# Patient Record
Sex: Male | Born: 1940
Health system: Southern US, Community
[De-identification: ages and names within clinical notes are randomized; demographics above are authoritative.]

## PROBLEM LIST (undated history)

## (undated) DIAGNOSIS — I10 Essential (primary) hypertension: Secondary | ICD-10-CM

## (undated) DIAGNOSIS — E669 Obesity, unspecified: Secondary | ICD-10-CM

## (undated) DIAGNOSIS — B009 Herpesviral infection, unspecified: Secondary | ICD-10-CM

## (undated) DIAGNOSIS — F17200 Nicotine dependence, unspecified, uncomplicated: Secondary | ICD-10-CM

## (undated) DIAGNOSIS — J45909 Unspecified asthma, uncomplicated: Secondary | ICD-10-CM

## (undated) DIAGNOSIS — J449 Chronic obstructive pulmonary disease, unspecified: Secondary | ICD-10-CM

## (undated) DIAGNOSIS — I517 Cardiomegaly: Secondary | ICD-10-CM

## (undated) DIAGNOSIS — E785 Hyperlipidemia, unspecified: Secondary | ICD-10-CM

## (undated) DIAGNOSIS — L570 Actinic keratosis: Secondary | ICD-10-CM

## (undated) DIAGNOSIS — M1712 Unilateral primary osteoarthritis, left knee: Secondary | ICD-10-CM

## (undated) HISTORY — DX: Obesity, unspecified: E66.9

## (undated) HISTORY — PX: HERNIA REPAIR: SHX51

## (undated) HISTORY — PX: BACK SURGERY: SHX140

## (undated) HISTORY — PX: LAMINECTOMY: SHX219

## (undated) HISTORY — DX: Essential (primary) hypertension: I10

## (undated) HISTORY — DX: Unilateral primary osteoarthritis, left knee: M17.12

## (undated) HISTORY — DX: Cardiomegaly: I51.7

## (undated) HISTORY — DX: Hyperlipidemia, unspecified: E78.5

## (undated) HISTORY — DX: Nicotine dependence, unspecified, uncomplicated: F17.200

## (undated) HISTORY — DX: Herpesviral infection, unspecified: B00.9

## (undated) HISTORY — PX: JOINT REPLACEMENT: SHX530

## (undated) HISTORY — DX: Actinic keratosis: L57.0

## (undated) HISTORY — DX: Chronic obstructive pulmonary disease, unspecified: J44.9

---

## 2000-04-10 ENCOUNTER — Encounter: Payer: Self-pay | Admitting: Cardiovascular Disease

## 2000-07-13 ENCOUNTER — Ambulatory Visit (HOSPITAL_COMMUNITY): Admission: RE | Admit: 2000-07-13 | Discharge: 2000-07-13 | Payer: Self-pay | Admitting: Specialist

## 2000-08-19 ENCOUNTER — Ambulatory Visit (HOSPITAL_COMMUNITY): Admission: RE | Admit: 2000-08-19 | Discharge: 2000-08-19 | Payer: Self-pay | Admitting: Specialist

## 2004-12-24 ENCOUNTER — Ambulatory Visit: Payer: Self-pay | Admitting: Internal Medicine

## 2006-12-03 ENCOUNTER — Encounter: Payer: Self-pay | Admitting: Cardiovascular Disease

## 2008-08-14 ENCOUNTER — Ambulatory Visit: Payer: Self-pay | Admitting: General Practice

## 2008-08-15 ENCOUNTER — Ambulatory Visit: Payer: Self-pay | Admitting: General Practice

## 2008-08-29 ENCOUNTER — Inpatient Hospital Stay: Payer: Self-pay | Admitting: General Practice

## 2010-11-12 ENCOUNTER — Encounter: Payer: Self-pay | Admitting: Cardiovascular Disease

## 2011-02-12 ENCOUNTER — Ambulatory Visit: Payer: Self-pay | Admitting: Cardiovascular Disease

## 2011-02-19 ENCOUNTER — Encounter: Payer: Self-pay | Admitting: Cardiovascular Disease

## 2011-02-19 ENCOUNTER — Ambulatory Visit (INDEPENDENT_AMBULATORY_CARE_PROVIDER_SITE_OTHER): Payer: Medicare Other | Admitting: Cardiovascular Disease

## 2011-02-19 VITALS — BP 122/62 | HR 60 | Ht 72.0 in | Wt 219.4 lb

## 2011-02-19 DIAGNOSIS — J449 Chronic obstructive pulmonary disease, unspecified: Secondary | ICD-10-CM

## 2011-02-19 DIAGNOSIS — I10 Essential (primary) hypertension: Secondary | ICD-10-CM

## 2011-02-19 DIAGNOSIS — E785 Hyperlipidemia, unspecified: Secondary | ICD-10-CM

## 2011-02-19 DIAGNOSIS — R5381 Other malaise: Secondary | ICD-10-CM

## 2011-02-19 DIAGNOSIS — E119 Type 2 diabetes mellitus without complications: Secondary | ICD-10-CM | POA: Insufficient documentation

## 2011-02-19 DIAGNOSIS — R5383 Other fatigue: Secondary | ICD-10-CM

## 2011-02-19 NOTE — Progress Notes (Signed)
   Patient ID: Devin Martinez, male    DOB: 12/01/1940, 70 y.o.   MRN: [id number]  HPI Comments: Mr. 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 Notes from Dr. Clearance Coots also indicate history of LVH, hypertension, hyperlipidemia, COPD, diabetes, obesity.  He reports that he has been feeling fatigued recently and wanted to have a checkup to make sure that he is doing well. He reports that he does not do any regular exercise. He tries to do some stretching exercises. He does have lower extremity weakness which he attributes to a remote history of back and disc disease. He took himself off his statin last year in August when his cholesterol was 180. He started red yeast rice. Most recent cholesterol in January of this year shows total cholesterol 188. Other breakdown is not available.  He denies any significant chest pain. No lightheadedness or dizziness. He does have some shortness of breath with exertion but this has been chronic. He does not take any inhalers. He does have occasional wheezing.  EKG shows normal sinus rhythm with a rate 60 beats minute, no significant ST or T wave changes.      Review of Systems  Constitutional: Positive for fatigue.  HENT: Negative.   Eyes: Negative.   Respiratory: Positive for shortness of breath.   Cardiovascular: Negative.   Gastrointestinal: Negative.   Genitourinary: Negative for hematuria.  Musculoskeletal: Negative.   Skin: Negative.   Neurological: Negative.   Hematological: Negative.   Psychiatric/Behavioral: Negative.   All other systems reviewed and are negative.   BP 122/62  Pulse 60  Ht 6' (1.829 m)  Wt 219 lb 6.4 oz (99.519 kg)  BMI 29.76 kg/m2  Physical Exam  Nursing note and vitals  reviewed. Constitutional: He is oriented to person, place, and time. He appears well-developed and well-nourished.  HENT:  Head: Normocephalic.  Nose: Nose normal.  Mouth/Throat: Oropharynx is clear and moist.  Eyes: Conjunctivae are normal. Pupils are equal, round, and reactive to light.  Neck: Normal range of motion. Neck supple. No JVD present.  Cardiovascular: Normal rate, regular rhythm, S1 normal, S2 normal, normal heart sounds and intact distal pulses.  Exam reveals no gallop and no friction rub.   No murmur heard. Pulmonary/Chest: Effort normal. No respiratory distress. He has decreased breath sounds. He has no wheezes. He has no rales. He exhibits no tenderness.  Abdominal: Soft. Bowel sounds are normal. He exhibits no distension. There is no tenderness.  Musculoskeletal: Normal range of motion. He exhibits no edema and no tenderness.  Lymphadenopathy:    He has no cervical adenopathy.  Neurological: He is alert and oriented to person, place, and time. Coordination normal.  Skin: Skin is warm and dry. No rash noted. No erythema.  Psychiatric: He has a normal mood and affect. His behavior is normal. Judgment and thought content normal.           Assessment and Plan

## 2011-02-19 NOTE — Assessment & Plan Note (Signed)
We have encouraged him to continue to lose weight. He is taking red yeast rice at this time. He will try 2 tablets a day, possibly 4 times a day. With his weight loss, I have suggested he check his cholesterol levels in 3-6 months time. He does have a long history of smoking and would be at higher risk of coronary artery disease.

## 2011-02-19 NOTE — Assessment & Plan Note (Signed)
He is doing very well in general. I do not think that he needs additional workup at this time such as stress testing unless he has worsening fatigue, shortness of breath or chest discomfort. He will call us for worsening symptoms.

## 2011-02-19 NOTE — Assessment & Plan Note (Signed)
Blood pressure is well controlled on today's visit. No changes made to the medications. 

## 2011-02-19 NOTE — Assessment & Plan Note (Signed)
We have encouraged continued exercise, careful diet management in an effort to lose weight. 

## 2011-02-19 NOTE — Assessment & Plan Note (Signed)
He does appear to have some wheezing on exam. He does have a long smoking history also has mild shortness of breath with exertion. Some of this could be coming from general deconditioning. I suggested he talk with Dr. Clearance Coots about using inhalers if his shortness of breath progresses.

## 2011-02-19 NOTE — Patient Instructions (Signed)
You are doing well. No medication changes were made. Please call us if you have new issues that need to be addressed before your next appt.  We will call you for a follow up Appt. In 12 months  

## 2013-03-03 ENCOUNTER — Ambulatory Visit: Payer: Self-pay | Admitting: Gastroenterology

## 2013-03-04 LAB — PATHOLOGY REPORT

## 2015-01-30 ENCOUNTER — Encounter (HOSPITAL_COMMUNITY): Payer: Self-pay | Admitting: *Deleted

## 2015-01-30 ENCOUNTER — Emergency Department (HOSPITAL_COMMUNITY): Payer: PPO

## 2015-01-30 ENCOUNTER — Emergency Department (HOSPITAL_COMMUNITY)
Admission: EM | Admit: 2015-01-30 | Discharge: 2015-01-30 | Disposition: A | Discharge status: Home / Self Care | Payer: PPO | Attending: Emergency Medicine | Admitting: Emergency Medicine

## 2015-01-30 DIAGNOSIS — R0789 Other chest pain: Secondary | ICD-10-CM

## 2015-01-30 DIAGNOSIS — E669 Obesity, unspecified: Secondary | ICD-10-CM | POA: Insufficient documentation

## 2015-01-30 DIAGNOSIS — Z7982 Long term (current) use of aspirin: Secondary | ICD-10-CM | POA: Diagnosis not present

## 2015-01-30 DIAGNOSIS — Z720 Tobacco use: Secondary | ICD-10-CM | POA: Insufficient documentation

## 2015-01-30 DIAGNOSIS — I10 Essential (primary) hypertension: Secondary | ICD-10-CM | POA: Insufficient documentation

## 2015-01-30 DIAGNOSIS — Z8619 Personal history of other infectious and parasitic diseases: Secondary | ICD-10-CM | POA: Insufficient documentation

## 2015-01-30 DIAGNOSIS — J449 Chronic obstructive pulmonary disease, unspecified: Secondary | ICD-10-CM | POA: Insufficient documentation

## 2015-01-30 DIAGNOSIS — Z8739 Personal history of other diseases of the musculoskeletal system and connective tissue: Secondary | ICD-10-CM | POA: Diagnosis not present

## 2015-01-30 DIAGNOSIS — Z79899 Other long term (current) drug therapy: Secondary | ICD-10-CM | POA: Insufficient documentation

## 2015-01-30 DIAGNOSIS — R079 Chest pain, unspecified: Secondary | ICD-10-CM | POA: Diagnosis present

## 2015-01-30 DIAGNOSIS — E119 Type 2 diabetes mellitus without complications: Secondary | ICD-10-CM | POA: Diagnosis not present

## 2015-01-30 HISTORY — DX: Unspecified asthma, uncomplicated: J45.909

## 2015-01-30 LAB — CBC WITH DIFFERENTIAL/PLATELET
Basophils Absolute: 0 10*3/uL (ref 0.0–0.1)
Basophils Relative: 0 % (ref 0–1)
Eosinophils Absolute: 0.3 10*3/uL (ref 0.0–0.7)
Eosinophils Relative: 3 % (ref 0–5)
HCT: 45.5 % (ref 39.0–52.0)
Hemoglobin: 15.2 g/dL (ref 13.0–17.0)
Lymphocytes Relative: 18 % (ref 12–46)
Lymphs Abs: 1.4 10*3/uL (ref 0.7–4.0)
MCH: 29 pg (ref 26.0–34.0)
MCHC: 33.4 g/dL (ref 30.0–36.0)
MCV: 86.8 fL (ref 78.0–100.0)
Monocytes Absolute: 0.9 10*3/uL (ref 0.1–1.0)
Monocytes Relative: 11 % (ref 3–12)
Neutro Abs: 5.3 10*3/uL (ref 1.7–7.7)
Neutrophils Relative %: 68 % (ref 43–77)
Platelets: 179 10*3/uL (ref 150–400)
RBC: 5.24 MIL/uL (ref 4.22–5.81)
RDW: 13.2 % (ref 11.5–15.5)
WBC: 7.8 10*3/uL (ref 4.0–10.5)

## 2015-01-30 LAB — COMPREHENSIVE METABOLIC PANEL
ALT: 20 U/L (ref 0–53)
AST: 27 U/L (ref 0–37)
Albumin: 3.9 g/dL (ref 3.5–5.2)
Alkaline Phosphatase: 89 U/L (ref 39–117)
Anion gap: 5 (ref 5–15)
BUN: 19 mg/dL (ref 6–23)
CO2: 31 mmol/L (ref 19–32)
Calcium: 9.4 mg/dL (ref 8.4–10.5)
Chloride: 102 mmol/L (ref 96–112)
Creatinine, Ser: 0.77 mg/dL (ref 0.50–1.35)
GFR calc Af Amer: >90 mL/min (ref >90)
GFR calc non Af Amer: 87 mL/min — ABNORMAL LOW (ref >90)
Glucose, Bld: 94 mg/dL (ref 70–99)
Potassium: 4 mmol/L (ref 3.5–5.1)
Sodium: 138 mmol/L (ref 135–145)
Total Bilirubin: 0.6 mg/dL (ref 0.3–1.2)
Total Protein: 6.8 g/dL (ref 6.0–8.3)

## 2015-01-30 LAB — I-STAT TROPONIN, ED
Troponin i, poc: 0 ng/mL (ref 0.00–0.08)
Troponin i, poc: 0.01 ng/mL (ref 0.00–0.08)

## 2015-01-30 LAB — BRAIN NATRIURETIC PEPTIDE: B Natriuretic Peptide: 40.6 pg/mL (ref 0.0–100.0)

## 2015-01-30 LAB — D-DIMER, QUANTITATIVE (NOT AT ARMC): D-Dimer, Quant: 0.31 ug/mL-FEU (ref 0.00–0.48)

## 2015-01-30 MED ORDER — NITROGLYCERIN 0.4 MG SL SUBL: 0.4000 mg | SUBLINGUAL_TABLET | SUBLINGUAL | Status: DC | PRN

## 2015-01-30 NOTE — ED Notes (Signed)
Pt refused nitro.

## 2015-01-30 NOTE — Discharge Instructions (Signed)

## 2015-01-30 NOTE — ED Notes (Signed)
Per EMS- pt has had left chest tightness and pain with breathing since yesterday. Pain is constant and non radiating. Pt received 324mg  Asprin with EMS and 1 nitro with no relief.

## 2015-01-30 NOTE — ED Notes (Signed)
Patient transported to X-ray 

## 2015-01-30 NOTE — ED Provider Notes (Signed)
CSN: [id number]     Arrival date & time 01/30/15  3016 History   First MD Initiated Contact with Patient 01/30/15 [id number]     Chief Complaint  Patient presents with  . Chest Pain     (Consider location/radiation/quality/duration/timing/severity/associated sxs/prior Treatment) Patient is a 74 y.o. male presenting with chest pain. No language interpreter was used.  Chest Pain Pain location:  L chest Pain quality: tightness   Pain radiates to:  Does not radiate Pain radiates to the back: no   Pain severity:  Moderate Onset quality:  Sudden Duration:  1 day Timing:  Constant Progression:  Resolved Chronicity:  New Context: at rest   Relieved by:  Nothing Worsened by:  Deep breathing Ineffective treatments:  None tried Associated symptoms: no abdominal pain, no anorexia, no anxiety, no back pain, no claudication, no cough, no diaphoresis, no dizziness, no dysphagia, no fatigue, no fever, no headache, no lower extremity edema, no nausea, no near-syncope, no numbness, no shortness of breath, not vomiting and no weakness   Risk factors: diabetes mellitus, high cholesterol, hypertension, male sex and smoking   Risk factors: no aortic disease, no birth control, no Ehlers-Danlos syndrome, no immobilization, not obese, not pregnant, no prior DVT/PE and no surgery     Past Medical History  Diagnosis Date  . Obesity   . LVH (left ventricular hypertrophy)     Hx. of  . Hyperlipidemia   . Herpes   . Hypertension   . COPD (chronic obstructive pulmonary disease)   . Degenerative arthritis of left knee   . Smoker   . Diabetes mellitus     Type II  . Asthma    Past Surgical History  Procedure Laterality Date  . Laminectomy     No family history on file. History  Substance Use Topics  . Smoking status: Current Some Day Smoker    Types: Cigarettes  . Smokeless tobacco: Never Used  . Alcohol Use: Yes     Comment: occasionally    Review of Systems  Constitutional: Negative for fever,  diaphoresis, activity change, appetite change and fatigue.  HENT: Negative for congestion, facial swelling, rhinorrhea and trouble swallowing.   Eyes: Negative for photophobia and pain.  Respiratory: Negative for cough, chest tightness and shortness of breath.   Cardiovascular: Positive for chest pain. Negative for claudication, leg swelling and near-syncope.  Gastrointestinal: Negative for nausea, vomiting, abdominal pain, diarrhea, constipation and anorexia.  Endocrine: Negative for polydipsia and polyuria.  Genitourinary: Negative for dysuria, urgency, decreased urine volume and difficulty urinating.  Musculoskeletal: Negative for back pain and gait problem.  Skin: Negative for color change, rash and wound.  Allergic/Immunologic: Negative for immunocompromised state.  Neurological: Negative for dizziness, facial asymmetry, speech difficulty, weakness, numbness and headaches.  Psychiatric/Behavioral: Negative for confusion, decreased concentration and agitation.      Allergies  Review of patient's allergies indicates no known allergies.  Home Medications   Prior to Admission medications   Medication Sig Start Date End Date Taking? Authorizing Provider  aspirin 81 MG tablet Take 81 mg by mouth daily.     Yes Historical Provider, MD  enalapril-hydrochlorothiazide (VASERETIC) 10-25 MG per tablet Take 1 tablet by mouth every other day.    Yes Historical Provider, MD  hydrochlorothiazide (HYDRODIURIL) 25 MG tablet Take 25 mg by mouth every other day. 10/30/14  Yes Historical Provider, MD  Multiple Vitamin (MULTIVITAMIN) tablet Take 1 tablet by mouth daily.     Yes Historical Provider, MD  OVER THE  COUNTER MEDICATION Take 1 tablet by mouth daily as needed. OTC heartburn   Yes Historical Provider, MD  Red Yeast Rice Extract (RED YEAST RICE PO) Take 1 tablet by mouth daily.     Yes Historical Provider, MD   BP 102/63 mmHg  Pulse 76  Temp(Src) 97.9 F (36.6 C) (Oral)  Resp 16  SpO2  95% Physical Exam  Constitutional: He is oriented to person, place, and time. He appears well-developed and well-nourished. No distress.  HENT:  Head: Normocephalic and atraumatic.  Mouth/Throat: No oropharyngeal exudate.  Eyes: Pupils are equal, round, and reactive to light.  Neck: Normal range of motion. Neck supple.  Cardiovascular: Normal rate, regular rhythm and normal heart sounds.  Exam reveals no gallop and no friction rub.   No murmur heard. Pulmonary/Chest: Effort normal and breath sounds normal. No respiratory distress. He has no wheezes. He has no rales.  Abdominal: Soft. Bowel sounds are normal. He exhibits no distension and no mass. There is no tenderness. There is no rebound and no guarding.  Musculoskeletal: Normal range of motion. He exhibits no edema or tenderness.  Neurological: He is alert and oriented to person, place, and time.  Skin: Skin is warm and dry.  Psychiatric: He has a normal mood and affect.    ED Course  Procedures (including critical care time) Labs Review Labs Reviewed  COMPREHENSIVE METABOLIC PANEL - Abnormal; Notable for the following:    GFR calc non Af Amer 87 (*)    All other components within normal limits  CBC WITH DIFFERENTIAL/PLATELET  BRAIN NATRIURETIC PEPTIDE  D-DIMER, QUANTITATIVE  I-STAT TROPOININ, ED  Randolm Idol, ED    Imaging Review Dg Chest 2 View  01/30/2015   CLINICAL DATA:  Chest pain, difficulty breathing starting yesterday  EXAM: CHEST  2 VIEW  COMPARISON:  None.  FINDINGS: Cardiomediastinal silhouette is unremarkable. No acute infiltrate or pulmonary edema. Streaky atelectasis or scarring noted right base medially. Calcified granuloma noted right base laterally.  IMPRESSION: No active disease. Streaky atelectasis or scarring right base medially. Calcified granuloma right base laterally.   Electronically Signed   By: Lahoma Crocker M.D.   On: 01/30/2015 10:22     EKG Interpretation   Date/Time:  Tuesday January 30 2015  09:05:30 EDT Ventricular Rate:  63 PR Interval:  160 QRS Duration: 105 QT Interval:  394 QTC Calculation: 403 R Axis:   72 Text Interpretation:  Sinus rhythm Ventricular premature complex Confirmed  by Oleg Oleson  MD, Tayler Lassen ([id number]) on 01/30/2015 9:14:20 AM      MDM   Final diagnoses:  Atypical chest pain    Pt is a 74 y.o. male with Pmhx as above who presents with since yesterday afternoon.  She reported K pain was constant and nonradiating without associated symptoms.  He denied shortness of breath, nausea, vomiting, diaphoresis, leg pain or swelling.  Patient received 325 mg of aspirin, one sublingual nitroglycerin with EMS prior to arrival and on my exam has no current chest pain.  No history or risk factors for PE other than age.  He reports having a stress test 5-10 years ago which was normal.  Risk factors for ACS include age, sex, smoking history hypertension, diabetes and hyperlipidemia. EKG with PAC, no acute ischemic changes. Trop negative, d-dimer nml. HEART score is 4 given age & risk factors, however, pt has had no pain here and description of pain is unlikely for ACS.   I've had discussion with patient about my wish for him  to have close outpatient follow-up with repeat stress testing this week, given his risk factors and my recommendations that he return to the ED for return of symptoms.     Suezanne Cheshire evaluation in the Emergency Department is complete. It has been determined that no acute conditions requiring further emergency intervention are present at this time. The patient/guardian have been advised of the diagnosis and plan. We have discussed signs and symptoms that warrant return to the ED, such as changes or worsening in symptoms, chest pain, shortness of breath, leg p sweatingain or swelling      Ernestina Patches, MD 01/30/15 1504

## 2015-01-30 NOTE — ED Notes (Signed)
Assisted Grayland Ormond, EMT with getting pt undressed, in gown, on monitor, continuous pulse oximetry and blood pressure cuff

## 2015-04-23 ENCOUNTER — Other Ambulatory Visit: Payer: Self-pay

## 2016-03-07 DIAGNOSIS — E782 Mixed hyperlipidemia: Secondary | ICD-10-CM | POA: Diagnosis not present

## 2016-03-07 DIAGNOSIS — R7303 Prediabetes: Secondary | ICD-10-CM | POA: Diagnosis not present

## 2016-03-07 DIAGNOSIS — I10 Essential (primary) hypertension: Secondary | ICD-10-CM | POA: Diagnosis not present

## 2016-03-13 DIAGNOSIS — I10 Essential (primary) hypertension: Secondary | ICD-10-CM | POA: Diagnosis not present

## 2016-03-13 DIAGNOSIS — E782 Mixed hyperlipidemia: Secondary | ICD-10-CM | POA: Diagnosis not present

## 2016-03-13 DIAGNOSIS — R05 Cough: Secondary | ICD-10-CM | POA: Diagnosis not present

## 2016-03-13 DIAGNOSIS — J069 Acute upper respiratory infection, unspecified: Secondary | ICD-10-CM | POA: Diagnosis not present

## 2016-03-13 DIAGNOSIS — R7303 Prediabetes: Secondary | ICD-10-CM | POA: Diagnosis not present

## 2016-03-16 ENCOUNTER — Encounter: Payer: Self-pay | Admitting: Emergency Medicine

## 2016-03-16 ENCOUNTER — Emergency Department: Payer: PPO

## 2016-03-16 ENCOUNTER — Inpatient Hospital Stay
Admission: EM | Admit: 2016-03-16 | Discharge: 2016-03-23 | DRG: 190 | Disposition: A | Discharge status: Home Health | Payer: PPO | Attending: Internal Medicine | Admitting: Internal Medicine

## 2016-03-16 DIAGNOSIS — J189 Pneumonia, unspecified organism: Secondary | ICD-10-CM

## 2016-03-16 DIAGNOSIS — M1712 Unilateral primary osteoarthritis, left knee: Secondary | ICD-10-CM | POA: Diagnosis not present

## 2016-03-16 DIAGNOSIS — R0902 Hypoxemia: Secondary | ICD-10-CM

## 2016-03-16 DIAGNOSIS — F17210 Nicotine dependence, cigarettes, uncomplicated: Secondary | ICD-10-CM | POA: Diagnosis not present

## 2016-03-16 DIAGNOSIS — R0600 Dyspnea, unspecified: Secondary | ICD-10-CM | POA: Diagnosis not present

## 2016-03-16 DIAGNOSIS — J441 Chronic obstructive pulmonary disease with (acute) exacerbation: Secondary | ICD-10-CM | POA: Diagnosis not present

## 2016-03-16 DIAGNOSIS — Z7982 Long term (current) use of aspirin: Secondary | ICD-10-CM

## 2016-03-16 DIAGNOSIS — I248 Other forms of acute ischemic heart disease: Secondary | ICD-10-CM | POA: Diagnosis not present

## 2016-03-16 DIAGNOSIS — J9690 Respiratory failure, unspecified, unspecified whether with hypoxia or hypercapnia: Secondary | ICD-10-CM

## 2016-03-16 DIAGNOSIS — J9600 Acute respiratory failure, unspecified whether with hypoxia or hypercapnia: Secondary | ICD-10-CM

## 2016-03-16 DIAGNOSIS — J45909 Unspecified asthma, uncomplicated: Secondary | ICD-10-CM | POA: Diagnosis present

## 2016-03-16 DIAGNOSIS — Z79899 Other long term (current) drug therapy: Secondary | ICD-10-CM | POA: Diagnosis not present

## 2016-03-16 DIAGNOSIS — J440 Chronic obstructive pulmonary disease with acute lower respiratory infection: Principal | ICD-10-CM | POA: Diagnosis present

## 2016-03-16 DIAGNOSIS — R0609 Other forms of dyspnea: Secondary | ICD-10-CM | POA: Diagnosis not present

## 2016-03-16 DIAGNOSIS — J9601 Acute respiratory failure with hypoxia: Secondary | ICD-10-CM | POA: Diagnosis present

## 2016-03-16 DIAGNOSIS — E785 Hyperlipidemia, unspecified: Secondary | ICD-10-CM | POA: Diagnosis not present

## 2016-03-16 DIAGNOSIS — Z8614 Personal history of Methicillin resistant Staphylococcus aureus infection: Secondary | ICD-10-CM | POA: Diagnosis not present

## 2016-03-16 DIAGNOSIS — I10 Essential (primary) hypertension: Secondary | ICD-10-CM | POA: Diagnosis not present

## 2016-03-16 DIAGNOSIS — R0603 Acute respiratory distress: Secondary | ICD-10-CM | POA: Diagnosis present

## 2016-03-16 DIAGNOSIS — R918 Other nonspecific abnormal finding of lung field: Secondary | ICD-10-CM | POA: Diagnosis not present

## 2016-03-16 DIAGNOSIS — J9611 Chronic respiratory failure with hypoxia: Secondary | ICD-10-CM | POA: Diagnosis not present

## 2016-03-16 DIAGNOSIS — E119 Type 2 diabetes mellitus without complications: Secondary | ICD-10-CM | POA: Diagnosis not present

## 2016-03-16 DIAGNOSIS — Z87891 Personal history of nicotine dependence: Secondary | ICD-10-CM | POA: Diagnosis not present

## 2016-03-16 DIAGNOSIS — R748 Abnormal levels of other serum enzymes: Secondary | ICD-10-CM | POA: Diagnosis not present

## 2016-03-16 LAB — BASIC METABOLIC PANEL
Anion gap: 11 (ref 5–15)
BUN: 31 mg/dL — ABNORMAL HIGH (ref 6–20)
CO2: 28 mmol/L (ref 22–32)
Calcium: 8.9 mg/dL (ref 8.9–10.3)
Chloride: 97 mmol/L — ABNORMAL LOW (ref 101–111)
Creatinine, Ser: 1.15 mg/dL (ref 0.61–1.24)
GFR calc Af Amer: >60 mL/min (ref >60)
GFR calc non Af Amer: 60 mL/min — ABNORMAL LOW (ref >60)
Glucose, Bld: 107 mg/dL — ABNORMAL HIGH (ref 65–99)
Potassium: 4.1 mmol/L (ref 3.5–5.1)
Sodium: 136 mmol/L (ref 135–145)

## 2016-03-16 LAB — TROPONIN I
Troponin I: 0.13 ng/mL — ABNORMAL HIGH (ref <0.031)
Troponin I: 0.14 ng/mL — ABNORMAL HIGH (ref <0.031)

## 2016-03-16 LAB — EXPECTORATED SPUTUM ASSESSMENT W REFEX TO RESP CULTURE

## 2016-03-16 LAB — CBC
HCT: 40.9 % (ref 40.0–52.0)
Hemoglobin: 13.7 g/dL (ref 13.0–18.0)
MCH: 28.5 pg (ref 26.0–34.0)
MCHC: 33.5 g/dL (ref 32.0–36.0)
MCV: 85 fL (ref 80.0–100.0)
Platelets: 315 10*3/uL (ref 150–440)
RBC: 4.81 MIL/uL (ref 4.40–5.90)
RDW: 14.3 % (ref 11.5–14.5)
WBC: 17.3 10*3/uL — ABNORMAL HIGH (ref 3.8–10.6)

## 2016-03-16 LAB — CBC WITH DIFFERENTIAL/PLATELET
Basophils Absolute: 0 10*3/uL (ref 0–0.1)
Basophils Relative: 0 %
Eosinophils Absolute: 0 10*3/uL (ref 0–0.7)
Eosinophils Relative: 0 %
HCT: 40.2 % (ref 40.0–52.0)
Hemoglobin: 13.3 g/dL (ref 13.0–18.0)
Lymphocytes Relative: 2 %
Lymphs Abs: 0.3 10*3/uL — ABNORMAL LOW (ref 1.0–3.6)
MCH: 28.5 pg (ref 26.0–34.0)
MCHC: 33.1 g/dL (ref 32.0–36.0)
MCV: 86.2 fL (ref 80.0–100.0)
Monocytes Absolute: 0.5 10*3/uL (ref 0.2–1.0)
Monocytes Relative: 3 %
Neutro Abs: 16.5 10*3/uL — ABNORMAL HIGH (ref 1.4–6.5)
Neutrophils Relative %: 95 %
Platelets: 293 10*3/uL (ref 150–440)
RBC: 4.66 MIL/uL (ref 4.40–5.90)
RDW: 14.7 % — ABNORMAL HIGH (ref 11.5–14.5)
WBC: 17.3 10*3/uL — ABNORMAL HIGH (ref 3.8–10.6)

## 2016-03-16 LAB — GLUCOSE, CAPILLARY
Glucose-Capillary: 124 mg/dL — ABNORMAL HIGH (ref 65–99)
Glucose-Capillary: 125 mg/dL — ABNORMAL HIGH (ref 65–99)

## 2016-03-16 LAB — MRSA PCR SCREENING: MRSA by PCR: POSITIVE — AB

## 2016-03-16 LAB — EXPECTORATED SPUTUM ASSESSMENT W GRAM STAIN, RFLX TO RESP C

## 2016-03-16 MED ORDER — ENALAPRIL-HYDROCHLOROTHIAZIDE 10-25 MG PO TABS: 1.0000 | ORAL_TABLET | ORAL | Status: DC

## 2016-03-16 MED ORDER — METHYLPREDNISOLONE SODIUM SUCC 125 MG IJ SOLR
60.0000 mg | Freq: Four times a day (QID) | INTRAMUSCULAR | Status: DC
  Administered 2016-03-16 – 2016-03-17 (×3): 60 mg via INTRAVENOUS
  Filled 2016-03-16 (×3): qty 2

## 2016-03-16 MED ORDER — ADULT MULTIVITAMIN W/MINERALS CH
1.0000 | ORAL_TABLET | Freq: Every day | ORAL | Status: DC
  Administered 2016-03-19 – 2016-03-23 (×4): 1 via ORAL
  Filled 2016-03-16 (×7): qty 1

## 2016-03-16 MED ORDER — HYDROCHLOROTHIAZIDE 25 MG PO TABS
25.0000 mg | ORAL_TABLET | ORAL | Status: DC
  Filled 2016-03-16: qty 1

## 2016-03-16 MED ORDER — ENOXAPARIN SODIUM 40 MG/0.4ML ~~LOC~~ SOLN
40.0000 mg | SUBCUTANEOUS | Status: DC
  Administered 2016-03-16 – 2016-03-22 (×6): 40 mg via SUBCUTANEOUS
  Filled 2016-03-16 (×6): qty 0.4

## 2016-03-16 MED ORDER — ONDANSETRON HCL 4 MG PO TABS
4.0000 mg | ORAL_TABLET | Freq: Four times a day (QID) | ORAL | Status: DC | PRN
  Filled 2016-03-16: qty 1

## 2016-03-16 MED ORDER — METHYLPREDNISOLONE SODIUM SUCC 125 MG IJ SOLR
80.0000 mg | Freq: Once | INTRAMUSCULAR | Status: AC
  Administered 2016-03-16: 80 mg via INTRAVENOUS
  Filled 2016-03-16: qty 2

## 2016-03-16 MED ORDER — HYDROCHLOROTHIAZIDE 25 MG PO TABS
25.0000 mg | ORAL_TABLET | ORAL | Status: DC
  Administered 2016-03-18 – 2016-03-22 (×3): 25 mg via ORAL
  Filled 2016-03-16 (×2): qty 1

## 2016-03-16 MED ORDER — CHLORHEXIDINE GLUCONATE CLOTH 2 % EX PADS
6.0000 | MEDICATED_PAD | Freq: Every day | CUTANEOUS | Status: AC
  Administered 2016-03-18 – 2016-03-21 (×4): 6 via TOPICAL

## 2016-03-16 MED ORDER — DEXTROSE 5 % IV SOLN
500.0000 mg | Freq: Once | INTRAVENOUS | Status: AC
  Administered 2016-03-16: 500 mg via INTRAVENOUS
  Filled 2016-03-16: qty 500

## 2016-03-16 MED ORDER — RED YEAST RICE 600 MG PO CAPS: ORAL_CAPSULE | Freq: Every day | ORAL | Status: DC

## 2016-03-16 MED ORDER — INSULIN ASPART 100 UNIT/ML ~~LOC~~ SOLN
0.0000 [IU] | Freq: Three times a day (TID) | SUBCUTANEOUS | Status: DC
  Administered 2016-03-17 – 2016-03-18 (×2): 2 [IU] via SUBCUTANEOUS
  Administered 2016-03-20: 3 [IU] via SUBCUTANEOUS
  Administered 2016-03-22: 2 [IU] via SUBCUTANEOUS
  Filled 2016-03-16 (×3): qty 2
  Filled 2016-03-16: qty 3

## 2016-03-16 MED ORDER — ACETAMINOPHEN 325 MG PO TABS: 650.0000 mg | ORAL_TABLET | Freq: Four times a day (QID) | ORAL | Status: DC | PRN

## 2016-03-16 MED ORDER — ASPIRIN EC 81 MG PO TBEC
81.0000 mg | DELAYED_RELEASE_TABLET | Freq: Every day | ORAL | Status: DC
  Administered 2016-03-16 – 2016-03-23 (×6): 81 mg via ORAL
  Filled 2016-03-16 (×8): qty 1

## 2016-03-16 MED ORDER — IPRATROPIUM-ALBUTEROL 0.5-2.5 (3) MG/3ML IN SOLN
3.0000 mL | Freq: Four times a day (QID) | RESPIRATORY_TRACT | Status: DC
  Administered 2016-03-16 – 2016-03-23 (×23): 3 mL via RESPIRATORY_TRACT
  Filled 2016-03-16 (×28): qty 3

## 2016-03-16 MED ORDER — ONDANSETRON HCL 4 MG/2ML IJ SOLN: 4.0000 mg | Freq: Four times a day (QID) | INTRAMUSCULAR | Status: DC | PRN

## 2016-03-16 MED ORDER — INSULIN ASPART 100 UNIT/ML ~~LOC~~ SOLN
0.0000 [IU] | Freq: Every day | SUBCUTANEOUS | Status: DC
Start: 2016-03-16 — End: 2016-03-16

## 2016-03-16 MED ORDER — CEFTRIAXONE SODIUM 1 G IJ SOLR
1.0000 g | INTRAMUSCULAR | Status: DC
  Filled 2016-03-16: qty 10

## 2016-03-16 MED ORDER — CEFTRIAXONE SODIUM 1 G IJ SOLR
1.0000 g | Freq: Once | INTRAMUSCULAR | Status: AC
  Administered 2016-03-16: 1 g via INTRAVENOUS
  Filled 2016-03-16: qty 10

## 2016-03-16 MED ORDER — ACETAMINOPHEN 650 MG RE SUPP: 650.0000 mg | Freq: Four times a day (QID) | RECTAL | Status: DC | PRN

## 2016-03-16 MED ORDER — DOCUSATE SODIUM 100 MG PO CAPS
100.0000 mg | ORAL_CAPSULE | Freq: Two times a day (BID) | ORAL | Status: DC
  Administered 2016-03-17 – 2016-03-21 (×6): 100 mg via ORAL
  Filled 2016-03-16 (×11): qty 1

## 2016-03-16 MED ORDER — IPRATROPIUM-ALBUTEROL 0.5-2.5 (3) MG/3ML IN SOLN
3.0000 mL | Freq: Once | RESPIRATORY_TRACT | Status: AC
  Administered 2016-03-16: 3 mL via RESPIRATORY_TRACT
  Filled 2016-03-16: qty 3

## 2016-03-16 MED ORDER — ALBUTEROL SULFATE (2.5 MG/3ML) 0.083% IN NEBU: 2.5000 mg | INHALATION_SOLUTION | RESPIRATORY_TRACT | Status: DC | PRN

## 2016-03-16 MED ORDER — DEXTROSE 5 % IV SOLN
250.0000 mg | INTRAVENOUS | Status: DC
  Filled 2016-03-16: qty 250

## 2016-03-16 MED ORDER — INSULIN ASPART 100 UNIT/ML ~~LOC~~ SOLN
0.0000 [IU] | Freq: Three times a day (TID) | SUBCUTANEOUS | Status: DC
  Administered 2016-03-16: 1 [IU] via SUBCUTANEOUS
  Filled 2016-03-16: qty 1

## 2016-03-16 MED ORDER — LORAZEPAM 2 MG/ML IJ SOLN
0.5000 mg | Freq: Once | INTRAMUSCULAR | Status: AC
  Administered 2016-03-16: 0.5 mg via INTRAVENOUS
  Filled 2016-03-16: qty 1

## 2016-03-16 MED ORDER — SODIUM CHLORIDE 0.9 % IV SOLN
INTRAVENOUS | Status: AC
  Administered 2016-03-16: 18:00:00 via INTRAVENOUS

## 2016-03-16 MED ORDER — ENALAPRIL MALEATE 10 MG PO TABS
10.0000 mg | ORAL_TABLET | ORAL | Status: DC
  Administered 2016-03-18 – 2016-03-22 (×3): 10 mg via ORAL
  Filled 2016-03-16: qty 2
  Filled 2016-03-16 (×2): qty 1

## 2016-03-16 MED ORDER — MUPIROCIN 2 % EX OINT
1.0000 "application " | TOPICAL_OINTMENT | Freq: Two times a day (BID) | CUTANEOUS | Status: AC
  Administered 2016-03-16 – 2016-03-21 (×10): 1 via NASAL
  Filled 2016-03-16: qty 22

## 2016-03-16 NOTE — ED Provider Notes (Signed)
[facility] Emergency Department Provider Note   ____________________________________________  Time seen: Approximately 2:11 PM  I have reviewed the triage vital signs and the nursing notes.   HISTORY  Chief Complaint Shortness of Breath and Cough    HPI Devin Martinez is a 75 y.o. male patient complaining of cough and shortness breath for a week getting worse slowly. He does not have a fever today but had a fever of 100.52 or 3 days ago. Cough is productive of cream-colored and yellow sputum. His oxygen saturations were in the 80s. He is not usually on oxygen. On oxygen in the emergency room his O2 sats are 98 his shortness of breath is worse with exertion   Past Medical History  Diagnosis Date  . Obesity   . LVH (left ventricular hypertrophy)     Hx. of  . Hyperlipidemia   . Herpes   . Hypertension   . COPD (chronic obstructive pulmonary disease) ([place])   . Degenerative arthritis of left knee   . Smoker   . Diabetes mellitus     Type II  . Asthma     Patient Active Problem List   Diagnosis Date Noted  . Acute respiratory distress (HCC) 03/16/2016  . Hyperlipidemia 02/19/2011  . HTN (hypertension) 02/19/2011  . Diabetes mellitus 02/19/2011  . Fatigue 02/19/2011    Past Surgical History  Procedure Laterality Date  . Laminectomy      No current outpatient prescriptions on file.  Allergies Review of patient's allergies indicates no known allergies.  History reviewed. No pertinent family history.  Social History Social History  Substance Use Topics  . Smoking status: Current Some Day Smoker    Types: Cigarettes  . Smokeless tobacco: Never Used  . Alcohol Use: Yes     Comment: occasionally    Review of Systems Constitutional: No fever/chills Eyes: No visual changes. ENT: No sore throat. Cardiovascular: Denies chest pain. RespiratorySee history of present illness Gastrointestinal: No abdominal pain.  No nausea, no vomiting.   No diarrhea.  No constipation. Genitourinary: Negative for dysuria. Musculoskeletal: Negative for back pain. Skin: Negative for rash. Neurological: Negative for headaches, focal weakness or numbness.  10-point ROS otherwise negative.  ____________________________________________   PHYSICAL EXAM:  VITAL SIGNS: ED Triage Vitals  Enc Vitals Group     BP 03/16/16 1347 119/102 mmHg     Pulse Rate 03/16/16 1347 119     Resp 03/16/16 1347 24     Temp 03/16/16 1356 97.7 F (36.5 C)     Temp Source 03/16/16 1356 Oral     SpO2 --      Weight --      Height --      Head Cir --      Peak Flow --      Pain Score --      Pain Loc --      Pain Edu? --      Excl. in [place]? --     Constitutional: Alert and oriented. Well appearing and in no acute distress. Eyes: Conjunctivae are normal. PERRL. EOMI. Head: Atraumatic. Nose: No congestion/rhinnorhea. Mouth/Throat: Mucous membranes are moist.  Oropharynx non-erythematous. Neck: No stridor.   Cardiovascular: Normal rate, regular rhythm. Grossly normal heart sounds.  Good peripheral circulation. Respiratory: Normal respiratory effort.  No retractions. Lungs CTAB. Gastrointestinal: Soft and nontender. No distention. No abdominal bruits. No CVA tenderness. Musculoskeletal: No lower extremity tenderness nor edema.  No joint effusions. Neurologic:  Normal speech and language.  No gross focal neurologic deficits are appreciated. No gait instability. Skin:  Skin is warm, dry and intact. No rash noted. Psychiatric: Mood and affect are normal. Speech and behavior are normal.  ____________________________________________   LABS (all labs ordered are listed, but only abnormal results are displayed)  Labs Reviewed  MRSA PCR SCREENING - Abnormal; Notable for the following:    MRSA by PCR POSITIVE (*)    All other components within normal limits  BASIC METABOLIC PANEL - Abnormal; Notable for the following:    Chloride 97 (*)    Glucose, Bld 107  (*)    BUN 31 (*)    GFR calc non Af Amer 60 (*)    All other components within normal limits  CBC - Abnormal; Notable for the following:    WBC 17.3 (*)    All other components within normal limits  TROPONIN I - Abnormal; Notable for the following:    Troponin I 0.13 (*)    All other components within normal limits  BLOOD GAS, ARTERIAL - Abnormal; Notable for the following:    pO2, Arterial 62 (*)    Allens test (pass/fail) POSITIVE (*)    All other components within normal limits  CBC WITH DIFFERENTIAL/PLATELET - Abnormal; Notable for the following:    WBC 17.3 (*)    RDW 14.7 (*)    Neutro Abs 16.5 (*)    Lymphs Abs 0.3 (*)    All other components within normal limits  TROPONIN I - Abnormal; Notable for the following:    Troponin I 0.14 (*)    All other components within normal limits  GLUCOSE, CAPILLARY - Abnormal; Notable for the following:    Glucose-Capillary 124 (*)    All other components within normal limits  CULTURE, EXPECTORATED SPUTUM-ASSESSMENT  CULTURE, BLOOD (ROUTINE X 2)  CULTURE, BLOOD (ROUTINE X 2)  CULTURE, RESPIRATORY (NON-EXPECTORATED)  BASIC METABOLIC PANEL  CBC  HEMOGLOBIN A1C  TROPONIN I  TROPONIN I   ____________________________________________  EKG  EKG read and interpreted by me shows sinus tachycardia rate of 109 axis no acute ST-T wave changes ____________________________________________  RADIOLOGY  Radiology reads pneumonia ____________________________________________   PROCEDURES    ____________________________________________   INITIAL IMPRESSION / ASSESSMENT AND PLAN / ED COURSE  Pertinent labs & imaging results that were available during my care of the patient were reviewed by me and considered in my medical decision making (see chart for details).   ____________________________________________   FINAL CLINICAL IMPRESSION(S) / ED DIAGNOSES  Final diagnoses:  Community acquired pneumonia  Hypoxia      NEW  MEDICATIONS STARTED DURING THIS VISIT:  Current Discharge Medication List       Note:  This document was prepared using Dragon voice recognition software and may include unintentional dictation errors.    Nena Polio, MD 03/16/16 2119

## 2016-03-16 NOTE — ED Notes (Signed)
Troponin elevated, MD made aware.

## 2016-03-16 NOTE — Progress Notes (Signed)
PHARMACIST - PHYSICIAN ORDER COMMUNICATION  CONCERNING: P&T Medication Policy on Herbal Medications  DESCRIPTION:  This patient's order for:  Red Yeast Rice Caps  has been noted.  This product(s) is classified as an "herbal" or natural product. Due to a lack of definitive safety studies or FDA approval, nonstandard manufacturing practices, plus the potential risk of unknown drug-drug interactions while on inpatient medications, the Pharmacy and Therapeutics Committee does not permit the use of "herbal" or natural products of this type within .   ACTION TAKEN: The pharmacy department is unable to verify this order at this time and your patient has been informed of this safety policy. Please reevaluate patient's clinical condition at discharge and address if the herbal or natural product(s) should be resumed at that time.  

## 2016-03-16 NOTE — H&P (Signed)
[facility] [place] NAME: Devin Martinez    MR#:  [id number]  DATE OF BIRTH:  [birth year]  DATE OF ADMISSION:  03/16/2016  PRIMARY CARE PHYSICIAN: Dion Body, MD   REQUESTING/REFERRING PHYSICIAN: Corinna Capra  CHIEF COMPLAINT:  Shortness of breath and cough  HISTORY OF PRESENT ILLNESS:  Devin Martinez  is a 75 y.o. male with a known history of Hypertension, COPD, hyperlipidemia and multiple other medical problems is presenting to the ED with a chief complaint of shortness of breath and productive cough for the past 4-5 days. Patient came into the ED as it has been progressively getting worse. Chest x-ray has revealed bilateral lower lobe infiltrates. Patient is diffusely wheezing and tachycardic. He used to smoke but quit smoking several years ago. Patient was unable to talk in complete sentences and working hard to breathe during my examination. Patient was given Solu-Medrol and breathing treatments. Also he has received first dose of IV Rocephin and azithromycin Denies any chest pain. Daughter at bedside.  PAST MEDICAL HISTORY:   Past Medical History  Diagnosis Date  . Obesity   . LVH (left ventricular hypertrophy)     Hx. of  . Hyperlipidemia   . Herpes   . Hypertension   . COPD (chronic obstructive pulmonary disease) ([place])   . Degenerative arthritis of left knee   . Smoker   . Diabetes mellitus     Type II  . Asthma     PAST SURGICAL HISTOIRY:   Past Surgical History  Procedure Laterality Date  . Laminectomy      SOCIAL HISTORY:   Social History  Substance Use Topics  . Smoking status: Current Some Day Smoker    Types: Cigarettes  . Smokeless tobacco: Never Used  . Alcohol Use: Yes     Comment: occasionally    FAMILY HISTORY:  History reviewed. No pertinent family history.  DRUG ALLERGIES:  No Known Allergies  REVIEW OF SYSTEMS:  CONSTITUTIONAL: No fever, fatigue or weakness.  EYES: No blurred or  double vision.  EARS, NOSE, AND THROAT: No tinnitus or ear pain.  RESPIRATORY:  reporting productive Cough, shortness of breath, wheezing , denies  hemoptysis.  CARDIOVASCULAR: No chest pain, orthopnea, edema.  GASTROINTESTINAL: No nausea, vomiting, diarrhea or abdominal pain.  GENITOURINARY: No dysuria, hematuria.  ENDOCRINE: No polyuria, nocturia,  HEMATOLOGY: No anemia, easy bruising or bleeding SKIN: No rash or lesion. MUSCULOSKELETAL: No joint pain or arthritis.   NEUROLOGIC: No tingling, numbness, weakness.  PSYCHIATRY: No anxiety or depression.   MEDICATIONS AT HOME:   Prior to Admission medications   Medication Sig Start Date End Date Taking? Authorizing Provider  aspirin 81 MG tablet Take 81 mg by mouth daily.     Yes Historical Provider, MD  enalapril-hydrochlorothiazide (VASERETIC) 10-25 MG per tablet Take 1 tablet by mouth every other day.    Yes Historical Provider, MD  hydrochlorothiazide (HYDRODIURIL) 25 MG tablet Take 25 mg by mouth every other day. 10/30/14  Yes Historical Provider, MD  Multiple Vitamin (MULTIVITAMIN) tablet Take 1 tablet by mouth daily.     Yes Historical Provider, MD  OVER THE COUNTER MEDICATION Take 1 tablet by mouth daily as needed. OTC heartburn   Yes Historical Provider, MD  Red Yeast Rice Extract (RED YEAST RICE PO) Take 1 tablet by mouth daily.     Yes Historical Provider, MD      VITAL SIGNS:  Blood pressure 123/57, pulse 120, temperature 97.7 F (  36.5 C), temperature source Oral, resp. rate 19, SpO2 91 %.  PHYSICAL EXAMINATION:  GENERAL:  75 y.o.-year-old patient lying in the bed with no acute distress.  EYES: Pupils equal, round, reactive to light and accommodation. No scleral icterus. Extraocular muscles intact.  HEENT: Head atraumatic, normocephalic. Oropharynx and nasopharynx clear.  NECK:  Supple, no jugular venous distention. No thyroid enlargement, no tenderness.  LUNGS: Moderate  breath sounds bilaterally, diffuse  wheezing,  No  rales,rhonchi or crepitation. Positive  use of accessory muscles of respiration.  CARDIOVASCULAR: S1, S2 normal. No murmurs, rubs, or gallops.  ABDOMEN: Soft, nontender, nondistended. Bowel sounds present. No organomegaly or mass.  EXTREMITIES: No pedal edema, cyanosis, or clubbing.  NEUROLOGIC: Cranial nerves II through XII are intact. Muscle strength 5/5 in all extremities. Sensation intact. Gait not checked.  PSYCHIATRIC: The patient is alert and oriented x 3.  SKIN: No obvious rash, lesion, or ulcer.   LABORATORY PANEL:   CBC  Recent Labs Lab 03/16/16 1403  WBC 17.3*  HGB 13.7  HCT 40.9  PLT 315   ------------------------------------------------------------------------------------------------------------------  Chemistries   Recent Labs Lab 03/16/16 1403  NA 136  K 4.1  CL 97*  CO2 28  GLUCOSE 107*  BUN 31*  CREATININE 1.15  CALCIUM 8.9   ------------------------------------------------------------------------------------------------------------------  Cardiac Enzymes  Recent Labs Lab 03/16/16 1408  TROPONINI 0.13*   ------------------------------------------------------------------------------------------------------------------  RADIOLOGY:  Dg Chest 2 View  03/16/2016  CLINICAL DATA:  Shortness of breath EXAM: CHEST  2 VIEW COMPARISON:  01/30/2015 chest radiograph. FINDINGS: Stable cardiomediastinal silhouette with normal heart size. No pneumothorax. No pleural effusion. Hyperinflated lungs. New hazy opacity in the bilateral lower lobes. No pulmonary edema. IMPRESSION: 1. New hazy opacity in the bilateral lower lobes, suggestive of infectious bronchiolitis/bronchopneumonia. 2. Hyperinflated lungs, suggesting COPD. Electronically Signed   By: Ilona Sorrel M.D.   On: 03/16/2016 14:46    EKG:   Orders placed or performed during the hospital encounter of 03/16/16  . ED EKG  . ED EKG    IMPRESSION AND PLAN:   Devin Martinez  is a 75 y.o. male with a known  history of Hypertension, COPD, hyperlipidemia and multiple other medical problems is presenting to the ED with a chief complaint of shortness of breath and productive cough for the past 4-5 days. Patient came into the ED as it has been progressively getting worse. Chest x-ray has revealed bilateral lower lobe infiltrates. Patient is diffusely wheezing and tachycardic  #Acute respiratory distress secondary to COPD exacerbation and bilateral pneumonia Will admit the patient to stepdown unit Stat ABGs are ordered and BiPAP Solu-Medrol first dose was given in the ED will continue 16 kg IV every 6 hours with close monitoring of the blood sugars   provide breathing treatments   #acute COPD exacerbation will provide Solu-Medrol IV, reading treatments and azithromycin   place patient on BiPAP and pulmonology consult is placed  #Bilateral lower lobe pneumonia community acquired We will obtain sputum culture and sensitivity. We will continue IV Rocephin and azithromycin which was initiated in the ED.   #Elevated troponin probably from demand ischemia Patient denies any chest pain. Initial troponin is at 0.13. We will continue close monitoring patient on telemetry and cycle cardiac biomarkers.  #History of diabetes mellitus but patient denies that he is a diabetic We'll check hemoglobin A1c and place the patient on sliding scale insulin   Will provide GI and DVT prophylaxis   All the records are reviewed and case discussed with  ED provider. Management plans discussed with the patient,  and patient's daughter and they are in agreement.  CODE STATUS: fc,daughter HCPOA  TOTAL CRITICAL CARE TIME TAKING CARE OF THIS PATIENT: 50 minutes.    Nicholes Mango M.D on 03/16/2016 at 4:07 PM  Between 7am to 6pm - Pager - [phone]  After 6pm go to [email or web address] - password [email or web address] [facility]  Office  [phone]  CC: Primary care physician; Dion Body, MD

## 2016-03-16 NOTE — Consult Note (Signed)
PULMONARY / CRITICAL CARE MEDICINE   Name: Devin Martinez MRN: [id number] DOB: [birth year]    ADMISSION DATE:  03/16/2016   CONSULTATION DATE:  03/16/2016  REFERRING MD:  Hospitalist  CHIEF COMPLAINT: dyspnea and cough  HISTORY OF PRESENT ILLNESS:   This is a 75 yo caucasian male with a PMH COPD/asthma(no documented PFTs), hypertesion, hyperlipidemia, and T2DM who presented to the ED with cough, dyspnea and fever. Symptoms started a week ago and progressively got worse. Cough is productive of yellow sputum. He denies fever, chills, nausea and vomiting. At the ED, he was wheezing, hypoxic with O2 saturation in the low 80s, tachypneic and tachycardic. He was placed on oxygen but continued to be dyspneic. As such, he was started on BiPAP. His chest x-ray showed bilateral lower lobe infiltrates suggestive of  Pneumonia. PCCM was consulted for further management. He has no documented pulmonary function tests and its unclear if he follows with a pulmonologist. He continues to smoke daily  PAST MEDICAL HISTORY :  He  has a past medical history of Obesity; LVH (left ventricular hypertrophy); Hyperlipidemia; Herpes; Hypertension; COPD (chronic obstructive pulmonary disease) ([place]); Degenerative arthritis of left knee; Smoker; Diabetes mellitus; and Asthma.  PAST SURGICAL HISTORY: He  has past surgical history that includes Laminectomy.  No Known Allergies  No current facility-administered medications on file prior to encounter.   Current Outpatient Prescriptions on File Prior to Encounter  Medication Sig  . aspirin 81 MG tablet Take 81 mg by mouth daily.    . enalapril-hydrochlorothiazide (VASERETIC) 10-25 MG per tablet Take 1 tablet by mouth every other day.   . hydrochlorothiazide (HYDRODIURIL) 25 MG tablet Take 25 mg by mouth every other day.  . Multiple Vitamin (MULTIVITAMIN) tablet Take 1 tablet by mouth daily.    Marland Kitchen OVER THE COUNTER MEDICATION Take 1 tablet by mouth daily as needed. OTC  heartburn  . Red Yeast Rice Extract (RED YEAST RICE PO) Take 1 tablet by mouth daily.      FAMILY HISTORY:  His indicated that his mother is deceased. He indicated that his brother is deceased.   SOCIAL HISTORY: He  reports that he has been smoking Cigarettes.  He has never used smokeless tobacco. He reports that he drinks alcohol. He reports that he does not use illicit drugs.  REVIEW OF SYSTEMS:   Unable to obtain due to worsening dyspnea with minimal exertion  SUBJECTIVE:   VITAL SIGNS: BP 137/76 mmHg  Pulse 107  Temp(Src) 97.8 F (36.6 C) (Oral)  Resp 19  Ht 5\' 11"  (1.803 m)  Wt 192 lb 10.9 oz (87.4 kg)  BMI 26.89 kg/m2  SpO2 96%  HEMODYNAMICS:    VENTILATOR SETTINGS:    INTAKE / OUTPUT: I/O last 3 completed shifts: In: 26.3 [I.V.:26.3] Out: -   PHYSICAL EXAMINATION: General: Well-developed, mild respiratory distress Neuro: Alert and oriented 2, moves all extremities, no focal deficits HEENT: PERRLA, oral mucosa moist, trachea midline Cardiovascular: Tachycardic, S1, S2, no murmur, regurg or gallop Lungs: Diminished airflow in bilateral lower lobes, expiratory and inspiratory wheezes, positive Rales in bilateral lower lobes Abdomen: Soft, nontender, normal bowel sounds Musculoskeletal: Positive range of motion, no visible deformities. Extremities: +2 pulses, trace edema Skin:  Warm and dry  LABS:  BMET  Recent Labs Lab 03/16/16 1403  NA 136  K 4.1  CL 97*  CO2 28  BUN 31*  CREATININE 1.15  GLUCOSE 107*    Electrolytes  Recent Labs Lab 03/16/16 1403  CALCIUM 8.9  CBC  Recent Labs Lab 03/16/16 1403 03/16/16 1741  WBC 17.3* 17.3*  HGB 13.7 13.3  HCT 40.9 40.2  PLT 315 293    Coag's No results for input(s): APTT, INR in the last 168 hours.  Sepsis Markers No results for input(s): LATICACIDVEN, PROCALCITON, O2SATVEN in the last 168 hours.  ABG  Recent Labs Lab 03/16/16 1552  PHART 7.40  PCO2ART 45  PO2ART 62*     Liver Enzymes No results for input(s): AST, ALT, ALKPHOS, BILITOT, ALBUMIN in the last 168 hours.  Cardiac Enzymes  Recent Labs Lab 03/16/16 1408 03/16/16 1741  TROPONINI 0.13* 0.14*    Glucose  Recent Labs Lab 03/16/16 1704  GLUCAP 124*    Imaging Dg Chest 2 View  03/16/2016  CLINICAL DATA:  Shortness of breath EXAM: CHEST  2 VIEW COMPARISON:  01/30/2015 chest radiograph. FINDINGS: Stable cardiomediastinal silhouette with normal heart size. No pneumothorax. No pleural effusion. Hyperinflated lungs. New hazy opacity in the bilateral lower lobes. No pulmonary edema. IMPRESSION: 1. New hazy opacity in the bilateral lower lobes, suggestive of infectious bronchiolitis/bronchopneumonia. 2. Hyperinflated lungs, suggesting COPD. Electronically Signed   By: Ilona Sorrel M.D.   On: 03/16/2016 14:46    STUDIES:  2-D echo on that  CULTURES: 03/16/2016 Blood cultures 2. Sputum culture MRSA screen-positive  ANTIBIOTICS: Ceftriaxone 03/16/2016 Azithromycin 03/16/2016  SIGNIFICANT EVENTS: 03/16/2016: ED with dyspnea and productive cough, admitted with community-acquired pneumonia and acute COPD exacerbation  LINES/TUBES: Peripheral IVs  DISCUSSION: 75 year old male with a history of COPD/asthma, presenting with community-acquired pneumonia, acute respiratory failure secondary to pneumonia, necessitating continuous BiPAP and acute COPD exacerbation  ASSESSMENT / PLAN:  PULMONARY A: Acute hypoxic respiratory failure. Acute COPD exacerbation Community-acquired pneumonia P:   -Continuous BiPAP and titrate to nasal cannula or high flow as needed. -Empiric antibiotics. -Nebulized bronchodilators -Daily chest x-ray. -ABG when necessary -Patient will require pulmonologist follow up upon discharge  -Smoking cessation advice given -IV steroids  CARDIOVASCULAR A:  Worsening dyspnea Mildly elevated troponin-likely due to demand ischemia History of hypertension History  of hyperlipidemia P:  -2-D echo to evaluate left ventricular function -Continue home blood pressure medications. -Trend cardiac enzymes -Hemodynamic monitoring per ICU protocol  RENAL A:   No acute issues P:   -Monitor and replace electrolytes  GASTROINTESTINAL A:   No acute issues P:   -Keep nothing by mouth while on continuous BiPAP. -May have oral meds with sips of water  HEMATOLOGIC A:   No acute issues P:  -Lovenox for VTE prophylaxis  INFECTIOUS A:   Community-acquired pneumonia P:   -Broad-spectrum antibiotics. -Follow-up cultures  ENDOCRINE A:   Type 2 diabetes P:   -Monitor blood glucose with sliding scale insulin coverage. -Hold oral hypoglycemic agents while nothing by mouth   Disposition and family update: No family at bedside. Further changes in treatment plan pending patient's clinical course and diagnostics.   Critical care time spent titrating BiPAP, reviewing history and labs, CXR, and ABG interpretation is 55 minutes  Kenyon Eshleman S. [facility] ANP-BC Pulmonary and [place] Pager [phone] or [phone]   03/16/2016, 8:30 PM

## 2016-03-16 NOTE — Progress Notes (Signed)
Pt removed BiPap mask and is refusing to have it placed back on.  RT notified.  Pt placed on O2 4L [place].

## 2016-03-17 ENCOUNTER — Inpatient Hospital Stay: Payer: PPO

## 2016-03-17 ENCOUNTER — Inpatient Hospital Stay
Admit: 2016-03-17 | Discharge: 2016-03-17 | Disposition: A | Discharge status: Home / Self Care | Payer: PPO | Attending: Adult Health | Admitting: Adult Health

## 2016-03-17 DIAGNOSIS — J9601 Acute respiratory failure with hypoxia: Secondary | ICD-10-CM | POA: Diagnosis not present

## 2016-03-17 DIAGNOSIS — I10 Essential (primary) hypertension: Secondary | ICD-10-CM | POA: Diagnosis not present

## 2016-03-17 DIAGNOSIS — R748 Abnormal levels of other serum enzymes: Secondary | ICD-10-CM | POA: Diagnosis not present

## 2016-03-17 DIAGNOSIS — J441 Chronic obstructive pulmonary disease with (acute) exacerbation: Secondary | ICD-10-CM | POA: Diagnosis not present

## 2016-03-17 DIAGNOSIS — R0600 Dyspnea, unspecified: Secondary | ICD-10-CM | POA: Diagnosis not present

## 2016-03-17 DIAGNOSIS — J189 Pneumonia, unspecified organism: Secondary | ICD-10-CM

## 2016-03-17 DIAGNOSIS — J9600 Acute respiratory failure, unspecified whether with hypoxia or hypercapnia: Secondary | ICD-10-CM | POA: Diagnosis not present

## 2016-03-17 LAB — CBC
HCT: 38.8 % — ABNORMAL LOW (ref 40.0–52.0)
Hemoglobin: 12.8 g/dL — ABNORMAL LOW (ref 13.0–18.0)
MCH: 28.1 pg (ref 26.0–34.0)
MCHC: 33 g/dL (ref 32.0–36.0)
MCV: 85.2 fL (ref 80.0–100.0)
Platelets: 294 10*3/uL (ref 150–440)
RBC: 4.55 MIL/uL (ref 4.40–5.90)
RDW: 14.2 % (ref 11.5–14.5)
WBC: 16.9 10*3/uL — ABNORMAL HIGH (ref 3.8–10.6)

## 2016-03-17 LAB — BLOOD GAS, ARTERIAL
Acid-Base Excess: 2.5 mmol/L (ref 0.0–3.0)
Allens test (pass/fail): POSITIVE — AB
Bicarbonate: 27.9 mEq/L (ref 21.0–28.0)
FIO2: 0.36
O2 Saturation: 91.4 %
Patient temperature: 37
pCO2 arterial: 45 mmHg (ref 32.0–48.0)
pH, Arterial: 7.4 (ref 7.350–7.450)
pO2, Arterial: 62 mmHg — ABNORMAL LOW (ref 83.0–108.0)

## 2016-03-17 LAB — BASIC METABOLIC PANEL
Anion gap: 9 (ref 5–15)
BUN: 35 mg/dL — ABNORMAL HIGH (ref 6–20)
CO2: 28 mmol/L (ref 22–32)
Calcium: 8.3 mg/dL — ABNORMAL LOW (ref 8.9–10.3)
Chloride: 97 mmol/L — ABNORMAL LOW (ref 101–111)
Creatinine, Ser: 1.06 mg/dL (ref 0.61–1.24)
GFR calc Af Amer: >60 mL/min (ref >60)
GFR calc non Af Amer: >60 mL/min (ref >60)
Glucose, Bld: 138 mg/dL — ABNORMAL HIGH (ref 65–99)
Potassium: 4.1 mmol/L (ref 3.5–5.1)
Sodium: 134 mmol/L — ABNORMAL LOW (ref 135–145)

## 2016-03-17 LAB — ECHOCARDIOGRAM COMPLETE
Height: 71 in
Weight: 3082.91 oz

## 2016-03-17 LAB — GLUCOSE, CAPILLARY
Glucose-Capillary: 117 mg/dL — ABNORMAL HIGH (ref 65–99)
Glucose-Capillary: 117 mg/dL — ABNORMAL HIGH (ref 65–99)
Glucose-Capillary: 133 mg/dL — ABNORMAL HIGH (ref 65–99)
Glucose-Capillary: 144 mg/dL — ABNORMAL HIGH (ref 65–99)
Glucose-Capillary: 152 mg/dL — ABNORMAL HIGH (ref 65–99)

## 2016-03-17 LAB — TROPONIN I
Troponin I: 0.03 ng/mL (ref <0.031)
Troponin I: 0.04 ng/mL — ABNORMAL HIGH (ref <0.031)

## 2016-03-17 LAB — HEMOGLOBIN A1C: Hgb A1c MFr Bld: 5.8 % (ref 4.0–6.0)

## 2016-03-17 MED ORDER — ENSURE ENLIVE PO LIQD
237.0000 mL | Freq: Two times a day (BID) | ORAL | Status: DC
  Administered 2016-03-17 – 2016-03-23 (×9): 237 mL via ORAL

## 2016-03-17 MED ORDER — VANCOMYCIN HCL IN DEXTROSE 1-5 GM/200ML-% IV SOLN
1000.0000 mg | Freq: Two times a day (BID) | INTRAVENOUS | Status: DC
  Administered 2016-03-17 – 2016-03-19 (×4): 1000 mg via INTRAVENOUS
  Filled 2016-03-17 (×5): qty 200

## 2016-03-17 MED ORDER — METHYLPREDNISOLONE SODIUM SUCC 125 MG IJ SOLR: 60.0000 mg | Freq: Four times a day (QID) | INTRAMUSCULAR | Status: DC

## 2016-03-17 MED ORDER — PREDNISONE 20 MG PO TABS
30.0000 mg | ORAL_TABLET | Freq: Every day | ORAL | Status: DC
  Administered 2016-03-18: 30 mg via ORAL
  Filled 2016-03-17: qty 1

## 2016-03-17 MED ORDER — METHYLPREDNISOLONE SODIUM SUCC 125 MG IJ SOLR
60.0000 mg | Freq: Four times a day (QID) | INTRAMUSCULAR | Status: AC
  Administered 2016-03-17 (×2): 60 mg via INTRAVENOUS
  Filled 2016-03-17 (×2): qty 2

## 2016-03-17 MED ORDER — PIPERACILLIN-TAZOBACTAM 3.375 G IVPB
3.3750 g | Freq: Three times a day (TID) | INTRAVENOUS | Status: AC
  Administered 2016-03-17 – 2016-03-18 (×4): 3.375 g via INTRAVENOUS
  Filled 2016-03-17 (×5): qty 50

## 2016-03-17 MED ORDER — BUDESONIDE 0.25 MG/2ML IN SUSP
0.2500 mg | Freq: Three times a day (TID) | RESPIRATORY_TRACT | Status: AC
  Administered 2016-03-17 – 2016-03-20 (×8): 0.25 mg via RESPIRATORY_TRACT
  Filled 2016-03-17 (×10): qty 2

## 2016-03-17 MED ORDER — VANCOMYCIN HCL 10 G IV SOLR
1500.0000 mg | Freq: Once | INTRAVENOUS | Status: AC
  Administered 2016-03-17: 1500 mg via INTRAVENOUS
  Filled 2016-03-17: qty 1500

## 2016-03-17 MED ORDER — IPRATROPIUM-ALBUTEROL 0.5-2.5 (3) MG/3ML IN SOLN
3.0000 mL | RESPIRATORY_TRACT | Status: DC | PRN
  Administered 2016-03-20 – 2016-03-22 (×5): 3 mL via RESPIRATORY_TRACT
  Filled 2016-03-17 (×6): qty 3

## 2016-03-17 NOTE — Progress Notes (Signed)
[facility] at [place] NAME: Devin Martinez    MR#:  [id number]  DATE OF BIRTH:  02-24-1941  SUBJECTIVE:   Patient here due to cough and shortness of breath and noted to be in COPD exacerbation due to pneumonia. Continues to have some wheezing and shortness of breath.  REVIEW OF SYSTEMS:    Review of Systems  Constitutional: Negative for fever and chills.  HENT: Negative for congestion and tinnitus.   Eyes: Negative for blurred vision and double vision.  Respiratory: Positive for cough, sputum production, shortness of breath and wheezing.   Cardiovascular: Negative for chest pain, orthopnea and PND.  Gastrointestinal: Negative for nausea, vomiting, abdominal pain and diarrhea.  Genitourinary: Negative for dysuria and hematuria.  Neurological: Negative for dizziness, sensory change and focal weakness.  All other systems reviewed and are negative.   Nutrition: Heart Healthy/Carb modified Tolerating Diet: yes Tolerating PT: Await Eval.    DRUG ALLERGIES:  No Known Allergies  VITALS:  Blood pressure 141/61, pulse 85, temperature 98.5 F (36.9 C), temperature source Axillary, resp. rate 17, height 5\' 11"  (1.803 m), weight 87.4 kg (192 lb 10.9 oz), SpO2 95 %.  PHYSICAL EXAMINATION:   Physical Exam  GENERAL:  75 y.o.-year-old patient lying in the bed in mild resp. distress.  EYES: Pupils equal, round, reactive to light and accommodation. No scleral icterus. Extraocular muscles intact.  HEENT: Head atraumatic, normocephalic. Oropharynx and nasopharynx clear.  NECK:  Supple, no jugular venous distention. No thyroid enlargement, no tenderness.  LUNGS: Poor respiratory effort, prolonged inspiratory and expiratory phase. End-Expiratory wheezing bilaterally, no rhonchi, rales. CARDIOVASCULAR: S1, S2 normal. No murmurs, rubs, or gallops.  ABDOMEN: Soft, nontender, nondistended. Bowel sounds present. No organomegaly or mass.  EXTREMITIES: No  cyanosis, clubbing or edema b/l.    NEUROLOGIC: Cranial nerves II through XII are intact. No focal Motor or sensory deficits b/l. Globally weak  PSYCHIATRIC: The patient is alert and oriented x 3.  SKIN: No obvious rash, lesion, or ulcer.    LABORATORY PANEL:   CBC  Recent Labs Lab 03/17/16 0525  WBC 16.9*  HGB 12.8*  HCT 38.8*  PLT 294   ------------------------------------------------------------------------------------------------------------------  Chemistries   Recent Labs Lab 03/17/16 0525  NA 134*  K 4.1  CL 97*  CO2 28  GLUCOSE 138*  BUN 35*  CREATININE 1.06  CALCIUM 8.3*   ------------------------------------------------------------------------------------------------------------------  Cardiac Enzymes  Recent Labs Lab 03/17/16 0525  TROPONINI 0.04*   ------------------------------------------------------------------------------------------------------------------  RADIOLOGY:  Dg Chest 2 View  03/16/2016  CLINICAL DATA:  Shortness of breath EXAM: CHEST  2 VIEW COMPARISON:  01/30/2015 chest radiograph. FINDINGS: Stable cardiomediastinal silhouette with normal heart size. No pneumothorax. No pleural effusion. Hyperinflated lungs. New hazy opacity in the bilateral lower lobes. No pulmonary edema. IMPRESSION: 1. New hazy opacity in the bilateral lower lobes, suggestive of infectious bronchiolitis/bronchopneumonia. 2. Hyperinflated lungs, suggesting COPD. Electronically Signed   By: Ilona Sorrel M.D.   On: 03/16/2016 14:46   Dg Chest Port 1 View  03/17/2016  CLINICAL DATA:  Acute respiratory failure. EXAM: PORTABLE CHEST 1 VIEW COMPARISON:  03/16/2016. FINDINGS: Low lung volumes accentuate the heart size which is probably stable. Improved interstitial prominence bilaterally, with faint bibasilar opacities, likely representing partial clearing of edema or infiltrates. CP angles are excluded from the film but there is no large effusion. COPD. IMPRESSION: Slight  improvement aeration. Electronically Signed   By: Staci Righter M.D.   On: 03/17/2016 07:33  ASSESSMENT AND PLAN:   75 year old male with past medical history of COPD, hypertension, hyperlipidemia, diabetes, obesity who presented to the hospital due to shortness of breath and cough and congestion.  1. Acute respiratory failure with hypoxia-this is secondary to COPD exacerbation and pneumonia.  -continue IV steroids, DuoNeb nebs around-the-clock,  Pulmicort nebs. -Continue broad-spectrum IV antibiotics for pneumonia with vancomycin, Zosyn.  2. COPD exacerbation-secondary to pneumonia. -Continue IV steroids, DuoNeb's, Pulmicort nebs. Continue broad-spectrum IV antibiotics.  3. Pneumonia-continue IV vancomycin, Zosyn. Follow sputum and blood cultures.  4. Diabetes type 2 without complication-continue sliding scale insulin.  5. Essential hypertension-continue enalapril/HCTZ.  6. Leukocytosis-secondary to pneumonia. Follow with IV antibiotic therapy.  7. Elevated troponin-likely secondary to demand ischemia from hypoxia and acute respiratory failure. Troponins have trended down.   All the records are reviewed and case discussed with Care Management/Social Workerr. Management plans discussed with the patient, family and they are in agreement.  CODE STATUS: Full  DVT Prophylaxis: Lovenox  TOTAL TIME TAKING CARE OF THIS PATIENT: 30 minutes.   POSSIBLE D/C IN 2-3 DAYS, DEPENDING ON CLINICAL CONDITION.   Henreitta Leber M.D on 03/17/2016 at 3:57 PM  Between 7am to 6pm - Pager - [phone]  After 6pm go to [email or web address] - password [email or web address] [place] [facility]  Office  [phone]  CC: Primary care physician; Dion Body, MD

## 2016-03-17 NOTE — Progress Notes (Signed)
Pharmacy Antibiotic Note  Devin Martinez is a 75 y.o. male admitted on 03/16/2016 with pneumonia.  Pharmacy has been consulted for Vancomycin and Zosyn  dosing.  Plan: Vancomycin 1000 mg  IV every 12 hours.  Goal trough 15-20 mcg/mL. Zosyn 3.375g IV q8h (4 hour infusion).  Height: 5\' 11"  (180.3 cm) Weight: 192 lb 10.9 oz (87.4 kg) IBW/kg (Calculated) : 75.3  Temp (24hrs), Avg:98 F (36.7 C), Min:97.6 F (36.4 C), Max:98.4 F (36.9 C)   Recent Labs Lab 03/16/16 1403 03/16/16 1741 03/17/16 0525  WBC 17.3* 17.3* 16.9*  CREATININE 1.15  --  1.06    Estimated Creatinine Clearance: 62.2 mL/min (by C-G formula based on Cr of 1.06).    No Known Allergies  Antimicrobials this admission: Rocephin  >>    Azithromycin >>   Dose adjustments this admission:   Microbiology results:  BCx:   UCx:    Sputum:    MRSA PCR: positive   Thank you for allowing pharmacy to be a part of this patient's care.  Skyah Hannon D 03/17/2016 11:41 AM

## 2016-03-17 NOTE — Progress Notes (Signed)
*  PRELIMINARY RESULTS* Echocardiogram 2D Echocardiogram has been performed.  Devin Martinez 03/17/2016, 11:13 AM

## 2016-03-17 NOTE — Plan of Care (Signed)
Problem: Phase I Progression Outcomes Goal: O2 sats > or equal 90% or at baseline Outcome: Not Progressing Pt refusing to wear full BiPap mask; provided with nose mask instead and medicated for anxiety.  Tolerated BiPap approx 2 hours and then pulled it off and refused to wear it any longer.  Place on 4L [place] and RT notified.  Remained on  and weaned down to 3L by end of shift.  Lung sounds remain coarse t/o and pt is having a lot of secretions.  DOE w/minimal activity.  Currently having increased WOB and agreeable to BiPap now.  RT contacted to put BiPap mask back together for pt use.

## 2016-03-18 DIAGNOSIS — J9601 Acute respiratory failure with hypoxia: Secondary | ICD-10-CM | POA: Diagnosis not present

## 2016-03-18 DIAGNOSIS — J189 Pneumonia, unspecified organism: Secondary | ICD-10-CM | POA: Diagnosis not present

## 2016-03-18 DIAGNOSIS — R748 Abnormal levels of other serum enzymes: Secondary | ICD-10-CM | POA: Diagnosis not present

## 2016-03-18 DIAGNOSIS — J441 Chronic obstructive pulmonary disease with (acute) exacerbation: Secondary | ICD-10-CM

## 2016-03-18 DIAGNOSIS — R0600 Dyspnea, unspecified: Secondary | ICD-10-CM | POA: Diagnosis not present

## 2016-03-18 LAB — BASIC METABOLIC PANEL
Anion gap: 7 (ref 5–15)
BUN: 47 mg/dL — ABNORMAL HIGH (ref 6–20)
CO2: 30 mmol/L (ref 22–32)
Calcium: 8.5 mg/dL — ABNORMAL LOW (ref 8.9–10.3)
Chloride: 99 mmol/L — ABNORMAL LOW (ref 101–111)
Creatinine, Ser: 0.99 mg/dL (ref 0.61–1.24)
GFR calc Af Amer: >60 mL/min (ref >60)
GFR calc non Af Amer: >60 mL/min (ref >60)
Glucose, Bld: 168 mg/dL — ABNORMAL HIGH (ref 65–99)
Potassium: 4.4 mmol/L (ref 3.5–5.1)
Sodium: 136 mmol/L (ref 135–145)

## 2016-03-18 LAB — BLOOD CULTURE ID PANEL (REFLEXED)
Acinetobacter baumannii: NOT DETECTED
Candida albicans: NOT DETECTED
Candida glabrata: NOT DETECTED
Candida krusei: NOT DETECTED
Candida parapsilosis: NOT DETECTED
Candida tropicalis: NOT DETECTED
Carbapenem resistance: NOT DETECTED
Enterobacter cloacae complex: NOT DETECTED
Enterobacteriaceae species: NOT DETECTED
Enterococcus species: NOT DETECTED
Escherichia coli: NOT DETECTED
Haemophilus influenzae: NOT DETECTED
Klebsiella oxytoca: NOT DETECTED
Klebsiella pneumoniae: NOT DETECTED
Listeria monocytogenes: NOT DETECTED
Methicillin resistance: NOT DETECTED
Neisseria meningitidis: NOT DETECTED
Proteus species: NOT DETECTED
Pseudomonas aeruginosa: NOT DETECTED
Serratia marcescens: NOT DETECTED
Staphylococcus aureus (BCID): NOT DETECTED
Staphylococcus species: DETECTED — AB
Streptococcus agalactiae: NOT DETECTED
Streptococcus pneumoniae: NOT DETECTED
Streptococcus pyogenes: NOT DETECTED
Streptococcus species: NOT DETECTED
Vancomycin resistance: NOT DETECTED

## 2016-03-18 LAB — CBC
HCT: 40.3 % (ref 40.0–52.0)
Hemoglobin: 13.3 g/dL (ref 13.0–18.0)
MCH: 28.5 pg (ref 26.0–34.0)
MCHC: 32.9 g/dL (ref 32.0–36.0)
MCV: 86.5 fL (ref 80.0–100.0)
Platelets: 251 10*3/uL (ref 150–440)
RBC: 4.66 MIL/uL (ref 4.40–5.90)
RDW: 14.4 % (ref 11.5–14.5)
WBC: 19.1 10*3/uL — ABNORMAL HIGH (ref 3.8–10.6)

## 2016-03-18 LAB — GLUCOSE, CAPILLARY
Glucose-Capillary: 142 mg/dL — ABNORMAL HIGH (ref 65–99)
Glucose-Capillary: 115 mg/dL — ABNORMAL HIGH (ref 65–99)
Glucose-Capillary: 114 mg/dL — ABNORMAL HIGH (ref 65–99)
Glucose-Capillary: 134 mg/dL — ABNORMAL HIGH (ref 65–99)

## 2016-03-18 MED ORDER — PREDNISONE 10 MG PO TABS
10.0000 mg | ORAL_TABLET | Freq: Every day | ORAL | Status: DC
Start: 2016-03-25 — End: 2016-03-20

## 2016-03-18 MED ORDER — PREDNISONE 2.5 MG PO TABS: 5.0000 mg | ORAL_TABLET | Freq: Every day | ORAL | Status: DC

## 2016-03-18 MED ORDER — PREDNISONE 20 MG PO TABS: 20.0000 mg | ORAL_TABLET | Freq: Every day | ORAL | Status: DC

## 2016-03-18 MED ORDER — PREDNISONE 20 MG PO TABS
30.0000 mg | ORAL_TABLET | Freq: Every day | ORAL | Status: DC
  Administered 2016-03-19 – 2016-03-20 (×2): 30 mg via ORAL
  Filled 2016-03-18 (×2): qty 1

## 2016-03-18 MED ORDER — ALPRAZOLAM 0.25 MG PO TABS
0.2500 mg | ORAL_TABLET | Freq: Once | ORAL | Status: AC
Start: 2016-03-18 — End: 2016-03-18
  Administered 2016-03-18: 0.25 mg via ORAL
  Filled 2016-03-18: qty 1

## 2016-03-18 NOTE — Progress Notes (Addendum)
PULMONARY / CRITICAL CARE MEDICINE   Name: Devin Martinez MRN: [id number] DOB: 1941/02/02    ADMISSION DATE:  03/16/2016  BRIEF HISTORY: 75 year old male past medical history of COPD/asthma, no documented PFTs, hyperlipidemia, hypertension, type 2 diabetes, who is admitted with dyspnea and cough secondary to bilateral lower lobe pneumonia. Patient states that he's been having respiratory symptoms of cough, yellow productive sputum, intermittent fever for the past week, he saw his PMD on last Thursday, was told that he may have just have an upper respiratory tract infection and treated with supportive care. However since then he is progressively worsening, upon visitation to the ED he had O2 saturations in the low 80s was tachypnea, tachycardic was placed on BiPAP and transferred to the ICU for further monitoring and observation.  SUBJECTIVE:  Still with anxiety with bipap mask, wore it for about 3 hours lastnight, still with productive cough.   STUDIES:  CXR 5/22 >>probable b/l infiltrates.   VITAL SIGNS: Temp:  [98.2 F (36.8 C)-98.5 F (36.9 C)] 98.3 F (36.8 C) (05/23 0200) Pulse Rate:  [34-93] 76 (05/23 0800) Resp:  [14-35] 21 (05/23 0800) BP: (113-158)/(47-97) 147/65 mmHg (05/23 0800) SpO2:  [89 %-100 %] 99 % (05/23 0800) FiO2 (%):  [40 %] 40 % (05/23 0231) Weight:  [190 lb 14.7 oz (86.6 kg)] 190 lb 14.7 oz (86.6 kg) (05/23 0500) HEMODYNAMICS:   VENTILATOR SETTINGS: Vent Mode:  [-]  FiO2 (%):  [40 %] 40 % INTAKE / OUTPUT:  Intake/Output Summary (Last 24 hours) at 03/18/16 1137 Last data filed at 03/17/16 1835  Gross per 24 hour  Intake    560 ml  Output    475 ml  Net     85 ml    Review of Systems  Constitutional: Negative for fever.  Respiratory: Positive for cough, shortness of breath and wheezing.   Cardiovascular: Negative for chest pain.  Gastrointestinal: Positive for nausea. Negative for heartburn.  Genitourinary: Negative for dysuria.  Musculoskeletal:  Negative for myalgias.  Neurological: Negative for dizziness and headaches.  Endo/Heme/Allergies: Does not bruise/bleed easily.  Psychiatric/Behavioral: Negative for depression.    Physical Exam  GEN-wearing BiPAP, in no acute respiratory distress at this time  HEENT-PEERLA, no lesions CVS-s1, s2 LUNGS-diffuse wheezing noted throughout entire lung fields, decreased basilar breath sounds, ABD-soft, nt, nd, +bs MSK-no lesions  LABS:  CBC  Recent Labs Lab 03/16/16 1741 03/17/16 0525 03/18/16 0609  WBC 17.3* 16.9* 19.1*  HGB 13.3 12.8* 13.3  HCT 40.2 38.8* 40.3  PLT 293 294 251   Coag's No results for input(s): APTT, INR in the last 168 hours. BMET  Recent Labs Lab 03/16/16 1403 03/17/16 0525 03/18/16 0609  NA 136 134* 136  K 4.1 4.1 4.4  CL 97* 97* 99*  CO2 28 28 30   BUN 31* 35* 47*  CREATININE 1.15 1.06 0.99  GLUCOSE 107* 138* 168*   Electrolytes  Recent Labs Lab 03/16/16 1403 03/17/16 0525 03/18/16 0609  CALCIUM 8.9 8.3* 8.5*   Sepsis Markers No results for input(s): LATICACIDVEN, PROCALCITON, O2SATVEN in the last 168 hours. ABG  Recent Labs Lab 03/16/16 1552  PHART 7.40  PCO2ART 45  PO2ART 62*   Liver Enzymes No results for input(s): AST, ALT, ALKPHOS, BILITOT, ALBUMIN in the last 168 hours. Cardiac Enzymes  Recent Labs Lab 03/16/16 1741 03/16/16 2323 03/17/16 0525  TROPONINI 0.14* 0.03 0.04*   Glucose  Recent Labs Lab 03/17/16 0734 03/17/16 1202 03/17/16 1545 03/17/16 2129 03/17/16 2346 03/18/16 0730  GLUCAP 133* 117* 117* 152* 144* 142*    Imaging No results found.  CULTURES: 03/16/2016 Blood cultures 2. 5/22 1/2 BC with stap Blood cutlures x 2, 5/23>> Sputum culture MRSA screen-positive  ANTIBIOTICS: Ceftriaxone 03/16/2016>5/22 Azithromycin 03/16/2016>5/22 Vanc/Zosyn 5/22>  ASSESSMENT / PLAN: PULMONARY A: Acute hypoxic respiratory failure. Acute COPD exacerbation Community-acquired pneumonia P:  -  anxiety/clustraphobia with bipap mask, will give one time dose of alprazolam tonight with bipap. Still with moderate wheezing and cough. Bipap compliance will help his overall respiratory status.  -Empiric antibiotics. -Nebulized bronchodilators -cxr prn -ABG when necessary -Patient will require pulmonologist follow up upon discharge  -Smoking cessation advice given -IV steroids, transition to PO steroids and taper over 12 days - ICS, flutter valve.   CARDIOVASCULAR A:  Worsening dyspnea Mildly elevated troponin-likely due to demand ischemia History of hypertension History of hyperlipidemia P:  -2-D echo to evaluate left ventricular function -Continue home blood pressure medications. -Trend cardiac enzymes -Hemodynamic monitoring per ICU protocol  RENAL A:  No acute issues P:  -Monitor and replace electrolytes  GASTROINTESTINAL A:  No acute issues P:  -soft diet/clears -prn nausea meds  HEMATOLOGIC A:  No acute issues P:  -Lovenox for VTE prophylaxis  INFECTIOUS A:  Community-acquired pneumonia P:  -Broad-spectrum antibiotics. -Follow-up cultures> 1/2 BC with stap, reculture  ENDOCRINE A:  Type 2 diabetes P:  -Monitor blood glucose with sliding scale insulin coverage. -Hold oral hypoglycemic agents while nothing by mouth   Thank you for consulting [facility], Please feel free to contacts [place] with any questions at [phone] (please enter 7-digits).  Pulmonary Consult time devoted to patient care services described in this note is 35 minutes.  Overall, patient is critically ill, prognosis is guarded. Patient at high risk for cardiac arrest and death.    Vilinda Boehringer, MD [facility] Pager [phone] (please enter 7-digits) On Call Pager [phone] (please enter 7-digits)  Note: This note was prepared with Dragon dictation along with smaller phrase technology. Any  transcriptional errors that result from this process are unintentional.

## 2016-03-18 NOTE — Progress Notes (Signed)
PHARMACY - PHYSICIAN COMMUNICATION CRITICAL VALUE ALERT - BLOOD CULTURE IDENTIFICATION (BCID)  Results for orders placed or performed during the hospital encounter of 03/16/16  Blood Culture ID Panel (Reflexed) (Collected: 03/16/2016  3:38 PM)  Result Value Ref Range   Enterococcus species NOT DETECTED NOT DETECTED   Vancomycin resistance NOT DETECTED NOT DETECTED   Listeria monocytogenes NOT DETECTED NOT DETECTED   Staphylococcus species DETECTED (A) NOT DETECTED   Staphylococcus aureus NOT DETECTED NOT DETECTED   Methicillin resistance NOT DETECTED NOT DETECTED   Streptococcus species NOT DETECTED NOT DETECTED   Streptococcus agalactiae NOT DETECTED NOT DETECTED   Streptococcus pneumoniae NOT DETECTED NOT DETECTED   Streptococcus pyogenes NOT DETECTED NOT DETECTED   Acinetobacter baumannii NOT DETECTED NOT DETECTED   Enterobacteriaceae species NOT DETECTED NOT DETECTED   Enterobacter cloacae complex NOT DETECTED NOT DETECTED   Escherichia coli NOT DETECTED NOT DETECTED   Klebsiella oxytoca NOT DETECTED NOT DETECTED   Klebsiella pneumoniae NOT DETECTED NOT DETECTED   Proteus species NOT DETECTED NOT DETECTED   Serratia marcescens NOT DETECTED NOT DETECTED   Carbapenem resistance NOT DETECTED NOT DETECTED   Haemophilus influenzae NOT DETECTED NOT DETECTED   Neisseria meningitidis NOT DETECTED NOT DETECTED   Pseudomonas aeruginosa NOT DETECTED NOT DETECTED   Candida albicans NOT DETECTED NOT DETECTED   Candida glabrata NOT DETECTED NOT DETECTED   Candida krusei NOT DETECTED NOT DETECTED   Candida parapsilosis NOT DETECTED NOT DETECTED   Candida tropicalis NOT DETECTED NOT DETECTED    Name of physician (or Provider) Contacted: Sainani  Changes to prescribed antibiotics required: Lab result suggestive of contamination. Patient on vancomycin and zosyn for pneumonia. No changes needed.  Finnian Husted C 03/18/2016  8:39 AM

## 2016-03-18 NOTE — Evaluation (Signed)
Physical Therapy Evaluation Patient Details Name: IRFAN KOPPLIN MRN: [id number] DOB: 09-24-1941 Today's Date: 03/18/2016   History of Present Illness  Pt is a 75 y.o. male presenting with SOB and productive cough x4-5 days.  Pt admitted to hospital with acute respiratory distress secondary to COPD exacerbation and B PNA.  PMH includes obesity, LVH, htn, COPD, degenerative arthritis L knee, h/o smoking, DM, asthma.  Clinical Impression  Prior to admission, pt was independent ambulating without AD.  Pt lives alone in 1 level home with small step to enter.  Currently pt is SBA supine to sit, min assist to stand, and CGA to take a few steps bed to recliner with RW (limited activity d/t pt coughing a lot once getting to chair and pt suctioning up secretions).  Pt would benefit from skilled PT to address noted impairments and functional limitations.  Anticipate with continued mobility during hospital stay, pt will be able to discharge to home with HHPT (pending pt's progress) when medically appropriate.     Follow Up Recommendations  (HHPT pending further assessment )    Equipment Recommendations   (TBD)    Recommendations for Other Services       Precautions / Restrictions Precautions Precautions: Fall Restrictions Weight Bearing Restrictions: No      Mobility  Bed Mobility Overal bed mobility: Needs Assistance Bed Mobility: Supine to Sit     Supine to sit: Supervision;HOB elevated     General bed mobility comments: assist for lines; vc's for breathing required  Transfers Overall transfer level: Needs assistance Equipment used: Rolling walker (2 wheeled) Transfers: Sit to/from Stand Sit to Stand: Min guard;Min assist         General transfer comment: vc's for walker use required  Ambulation/Gait Ambulation/Gait assistance: Min guard Ambulation Distance (Feet): 3 Feet (bed to chair) Assistive device: Rolling walker (2 wheeled)   Gait velocity: decreased   General Gait  Details: decreased B step length/foot clearance/heelstrike; limited distance d/t pt coughing and using suctioning once in chair  Stairs            Wheelchair Mobility    Modified Rankin (Stroke Patients Only)       Balance Overall balance assessment: Needs assistance Sitting-balance support: Bilateral upper extremity supported;Feet supported Sitting balance-Leahy Scale: Good     Standing balance support: Bilateral upper extremity supported (on RW) Standing balance-Leahy Scale: Good                               Pertinent Vitals/Pain Pain Assessment: No/denies pain  O2 90% or greater on supplemental O2 during session.    Home Living Family/patient expects to be discharged to:: Private residence Living Arrangements: Alone (with dog and kitten)   Type of Home: House Home Access: Stairs to enter   [employer] of Steps: 1 small step with B posts Home Layout: One level Home Equipment: Walker - 2 wheels;Cane - single point      Prior Function Level of Independence: Independent         Comments: Pt denies any falls in past 6 months.     Hand Dominance        Extremity/Trunk Assessment   Upper Extremity Assessment: Generalized weakness           Lower Extremity Assessment: Generalized weakness         Communication   Communication: No difficulties  Cognition Arousal/Alertness: Awake/alert Behavior During Therapy: WFL for tasks  assessed/performed Overall Cognitive Status: Within Functional Limits for tasks assessed                      General Comments   Nursing cleared pt for participation in physical therapy.  Pt agreeable to PT session.    Exercises Total Joint Exercises Ankle Circles/Pumps: AROM;Strengthening;Both;10 reps;Supine Quad Sets: AROM;Strengthening;Both;10 reps;Supine Short Arc Quad: AROM;Strengthening;Both;10 reps;Supine Heel Slides: AROM;Strengthening;Both;10 reps;Supine Hip ABduction/ADduction:  AROM;Strengthening;Both;10 reps;Supine      Assessment/Plan    PT Assessment Patient needs continued PT services  PT Diagnosis Difficulty walking;Generalized weakness   PT Problem List Decreased activity tolerance;Decreased balance;Decreased mobility;Decreased knowledge of use of DME;Cardiopulmonary status limiting activity  PT Treatment Interventions DME instruction;Gait training;Stair training;Functional mobility training;Therapeutic activities;Therapeutic exercise;Balance training;Patient/family education   PT Goals (Current goals can be found in the Care Plan section) Acute Rehab PT Goals Patient Stated Goal: to get OOB and walk PT Goal Formulation: With patient Time For Goal Achievement: 04/01/16 Potential to Achieve Goals: Good    Frequency Min 2X/week   Barriers to discharge Decreased caregiver support      Co-evaluation               End of Session Equipment Utilized During Treatment: Gait belt;Oxygen Activity Tolerance: Other (comment) (Limited session d/t pt coughing a lot after getting up to chair and needing to suction secretions) Patient left: in chair;with call bell/phone within reach (chair pad alarm under pt) Nurse Communication: Mobility status;Precautions         Time: [id number] PT Time Calculation (min) (ACUTE ONLY): 28 min   Charges:   PT Evaluation $PT Eval Moderate Complexity: 1 Procedure PT Treatments $Therapeutic Exercise: 8-22 mins   PT G CodesLeitha Bleak 04-06-2016, 4:21 PM Leitha Bleak, [place]

## 2016-03-18 NOTE — Progress Notes (Signed)
[facility] [place] NAME: Devin Martinez    MR#:  [id number]  DATE OF BIRTH:  [birth year]  SUBJECTIVE:   Patient here due to cough and shortness of breath and noted to be in COPD exacerbation due to pneumonia. Feels better. Requiring BiPAP at bedtime. Shortness of breath has improved.  REVIEW OF SYSTEMS:    Review of Systems  Constitutional: Negative for fever and chills.  HENT: Negative for congestion and tinnitus.   Eyes: Negative for blurred vision and double vision.  Respiratory: Positive for cough, shortness of breath and wheezing. Negative for sputum production.   Cardiovascular: Negative for chest pain, orthopnea and PND.  Gastrointestinal: Negative for nausea, vomiting, abdominal pain and diarrhea.  Genitourinary: Negative for dysuria and hematuria.  Neurological: Negative for dizziness, sensory change and focal weakness.  All other systems reviewed and are negative.   Nutrition: Heart Healthy/Carb modified Tolerating Diet: yes Tolerating PT: Await Eval.    DRUG ALLERGIES:  No Known Allergies  VITALS:  Blood pressure 138/60, pulse 76, temperature 98.3 F (36.8 C), temperature source Oral, resp. rate 23, height 5\' 11"  (1.803 m), weight 86.6 kg (190 lb 14.7 oz), SpO2 98 %.  PHYSICAL EXAMINATION:   Physical Exam  GENERAL:  75 y.o.-year-old patient lying in the bed in mild resp. distress.  EYES: Pupils equal, round, reactive to light and accommodation. No scleral icterus. Extraocular muscles intact.  HEENT: Head atraumatic, normocephalic. Oropharynx and nasopharynx clear.  NECK:  Supple, no jugular venous distention. No thyroid enlargement, no tenderness.  LUNGS: Poor respiratory effort, prolonged inspiratory and expiratory phase. Expiratory wheezing bilaterally, no rhonchi, rales. CARDIOVASCULAR: S1, S2 normal. No murmurs, rubs, or gallops.  ABDOMEN: Soft, nontender, nondistended. Bowel sounds present. No organomegaly or mass.   EXTREMITIES: No cyanosis, clubbing or edema b/l.    NEUROLOGIC: Cranial nerves II through XII are intact. No focal Motor or sensory deficits b/l. Globally weak  PSYCHIATRIC: The patient is alert and oriented x 3.  SKIN: No obvious rash, lesion, or ulcer.    LABORATORY PANEL:   CBC  Recent Labs Lab 03/18/16 0609  WBC 19.1*  HGB 13.3  HCT 40.3  PLT 251   ------------------------------------------------------------------------------------------------------------------  Chemistries   Recent Labs Lab 03/18/16 0609  NA 136  K 4.4  CL 99*  CO2 30  GLUCOSE 168*  BUN 47*  CREATININE 0.99  CALCIUM 8.5*   ------------------------------------------------------------------------------------------------------------------  Cardiac Enzymes  Recent Labs Lab 03/17/16 0525  TROPONINI 0.04*   ------------------------------------------------------------------------------------------------------------------  RADIOLOGY:  Dg Chest Port 1 View  03/17/2016  CLINICAL DATA:  Acute respiratory failure. EXAM: PORTABLE CHEST 1 VIEW COMPARISON:  03/16/2016. FINDINGS: Low lung volumes accentuate the heart size which is probably stable. Improved interstitial prominence bilaterally, with faint bibasilar opacities, likely representing partial clearing of edema or infiltrates. CP angles are excluded from the film but there is no large effusion. COPD. IMPRESSION: Slight improvement aeration. Electronically Signed   By: Staci Righter M.D.   On: 03/17/2016 07:33     ASSESSMENT AND PLAN:   75 year old male with past medical history of COPD, hypertension, hyperlipidemia, diabetes, obesity who presented to the hospital due to shortness of breath and cough and congestion.  1. Acute respiratory failure with hypoxia-this is secondary to COPD exacerbation and pneumonia.  -off IV steroids and switched to Oral Pred as per Pulmonary, cont. DuoNeb nebs around-the-clock,  Pulmicort nebs. - will d/c Zosyn and  cont. Vanc for now.    2. COPD  exacerbation-secondary to pneumonia.  Improving. - tapered from IV steroids to Oral Pred as per Pulmonary, cont. DuoNeb's, Pulmicort nebs.  - cont. Vancomycin  3. Pneumonia-continue IV vancomycin, d/c Zosyn.  - BC consistent with staph but this is a skin contaminant. Sputum cultures negative so far.  4. Diabetes type 2 without complication-continue sliding scale insulin.  5. Essential hypertension-continue enalapril/HCTZ.  6. Leukocytosis-slightly elevated today. Some of its steroid mediated. And also secondary to pneumonia.  - Follow with IV antibiotic therapy.  7. Elevated troponin-likely secondary to demand ischemia from hypoxia and acute respiratory failure. Troponins have trended down.  I will get a physical therapy evaluation.  All the records are reviewed and case discussed with Care Management/Social Workerr. Management plans discussed with the patient, family and they are in agreement.  CODE STATUS: Full  DVT Prophylaxis: Lovenox  TOTAL TIME TAKING CARE OF THIS PATIENT: 30 minutes.   POSSIBLE D/C IN 2-3 DAYS, DEPENDING ON CLINICAL CONDITION.   Henreitta Leber M.D on 03/18/2016 at 3:54 PM  Between 7am to 6pm - Pager - [phone]  After 6pm go to [email or web address] - password [email or web address] [facility]  Office  [phone]  CC: Primary care physician; Dion Body, MD

## 2016-03-18 NOTE — Progress Notes (Signed)
Pharmacy Antibiotic Note  Devin Martinez is a 75 y.o. male admitted on 03/16/2016 with pneumonia.  Pharmacy has been consulted for Vancomycin and Zosyn  dosing.  Plan: Vancomycin 1000 mg  IV every 12 hours.  Goal trough 15-20 mcg/mL. Zosyn 3.375g IV q8h (4 hour infusion).  Trough level ordered to be drawn @ 08:30 on 5/24.  Height: 5\' 11"  (180.3 cm) Weight: 190 lb 14.7 oz (86.6 kg) IBW/kg (Calculated) : 75.3  Temp (24hrs), Avg:98.3 F (36.8 C), Min:98.2 F (36.8 C), Max:98.5 F (36.9 C)   Recent Labs Lab 03/16/16 1403 03/16/16 1741 03/17/16 0525 03/18/16 0609  WBC 17.3* 17.3* 16.9* 19.1*  CREATININE 1.15  --  1.06 0.99    Estimated Creatinine Clearance: 66.6 mL/min (by C-G formula based on Cr of 0.99).    No Known Allergies  Antimicrobials this admission: Rocephin  >>    Azithromycin >>   Dose adjustments this admission:   Microbiology results:  BCx:   UCx:    Sputum:    MRSA PCR: positive   Thank you for allowing pharmacy to be a part of this patient's care.  Kwame Ryland D 03/18/2016 2:05 PM

## 2016-03-19 DIAGNOSIS — R748 Abnormal levels of other serum enzymes: Secondary | ICD-10-CM | POA: Diagnosis not present

## 2016-03-19 DIAGNOSIS — J441 Chronic obstructive pulmonary disease with (acute) exacerbation: Secondary | ICD-10-CM | POA: Diagnosis not present

## 2016-03-19 DIAGNOSIS — J9601 Acute respiratory failure with hypoxia: Secondary | ICD-10-CM | POA: Diagnosis not present

## 2016-03-19 DIAGNOSIS — R0600 Dyspnea, unspecified: Secondary | ICD-10-CM | POA: Diagnosis not present

## 2016-03-19 DIAGNOSIS — J189 Pneumonia, unspecified organism: Secondary | ICD-10-CM | POA: Diagnosis not present

## 2016-03-19 LAB — VANCOMYCIN, TROUGH: Vancomycin Tr: 16 ug/mL (ref 10–20)

## 2016-03-19 LAB — GLUCOSE, CAPILLARY
Glucose-Capillary: 97 mg/dL (ref 65–99)
Glucose-Capillary: 115 mg/dL — ABNORMAL HIGH (ref 65–99)
Glucose-Capillary: 119 mg/dL — ABNORMAL HIGH (ref 65–99)
Glucose-Capillary: 149 mg/dL — ABNORMAL HIGH (ref 65–99)

## 2016-03-19 LAB — CBC
HCT: 40.1 % (ref 40.0–52.0)
Hemoglobin: 13.1 g/dL (ref 13.0–18.0)
MCH: 27.9 pg (ref 26.0–34.0)
MCHC: 32.6 g/dL (ref 32.0–36.0)
MCV: 85.5 fL (ref 80.0–100.0)
Platelets: 336 10*3/uL (ref 150–440)
RBC: 4.7 MIL/uL (ref 4.40–5.90)
RDW: 14.5 % (ref 11.5–14.5)
WBC: 18.9 10*3/uL — ABNORMAL HIGH (ref 3.8–10.6)

## 2016-03-19 LAB — CULTURE, RESPIRATORY W GRAM STAIN

## 2016-03-19 LAB — CULTURE, RESPIRATORY: Culture: NORMAL

## 2016-03-19 MED ORDER — FAMOTIDINE 20 MG PO TABS
20.0000 mg | ORAL_TABLET | Freq: Every day | ORAL | Status: DC
  Administered 2016-03-19 – 2016-03-23 (×5): 20 mg via ORAL
  Filled 2016-03-19 (×5): qty 1

## 2016-03-19 MED ORDER — AMOXICILLIN-POT CLAVULANATE 875-125 MG PO TABS
1.0000 | ORAL_TABLET | Freq: Two times a day (BID) | ORAL | Status: DC
  Administered 2016-03-19 – 2016-03-23 (×9): 1 via ORAL
  Filled 2016-03-19 (×11): qty 1

## 2016-03-19 MED ORDER — PIPERACILLIN-TAZOBACTAM 3.375 G IVPB
3.3750 g | Freq: Three times a day (TID) | INTRAVENOUS | Status: DC
  Administered 2016-03-19: 3.375 g via INTRAVENOUS
  Filled 2016-03-19 (×4): qty 50

## 2016-03-19 NOTE — Progress Notes (Signed)
Report given to [facility] on 2A.  Transported to non monitor bed rm 239 .  Bed control approval.

## 2016-03-19 NOTE — Progress Notes (Signed)
Pharmacy Antibiotic Note  Devin Martinez is a 75 y.o. male admitted on 03/16/2016 with pneumonia.  Pharmacy has been consulted for vancomycin dosing.  Plan:  Continue vancomycin 1g IV Q12hr, goal trough 15-20.   Height: 5\' 11"  (180.3 cm) Weight: 190 lb 14.7 oz (86.6 kg) IBW/kg (Calculated) : 75.3  Temp (24hrs), Avg:97.6 F (36.4 C), Min:96.5 F (35.8 C), Max:98.4 F (36.9 C)   Recent Labs Lab 03/16/16 1403 03/16/16 1741 03/17/16 0525 03/18/16 0609 03/19/16 0401 03/19/16 0830  WBC 17.3* 17.3* 16.9* 19.1* 18.9*  --   CREATININE 1.15  --  1.06 0.99  --   --   VANCOTROUGH  --   --   --   --   --  16    Estimated Creatinine Clearance: 66.6 mL/min (by C-G formula based on Cr of 0.99).    No Known Allergies  Antimicrobials this admission: Vancomycin 5/22 >>  Zosyn 5/22 >> 5/24  Ceftriaxone 5/21 >> 5/21   Azithromycin 5/21 >> 5/21   Dose adjustments this admission:   Microbiology results:  05/23 BCx x 2: no growth <24 hours   05/21 Sputum:  Pending   05/21 MRSA PCR: positive   Pharmacy will continue to monitor and adjust per consult.   Simpson,Michael L 03/19/2016 3:03 PM

## 2016-03-19 NOTE — Progress Notes (Signed)
Pt has a V60 BIPAP machine in his room. Pt doesn't want to wear BIPAP at this time.

## 2016-03-19 NOTE — Progress Notes (Signed)
[facility] [place] NAME: Devin Martinez    MR#:  [id number]  DATE OF BIRTH:  [birth year]  SUBJECTIVE:   Patient here due to cough and shortness of breath and noted to be in COPD exacerbation due to pneumonia. Tolerated 4 hours of Bipap last night w/ some Xanax.  Feels a bit better today. Shortness of breath, cough improved.   REVIEW OF SYSTEMS:    Review of Systems  Constitutional: Negative for fever and chills.  HENT: Negative for congestion and tinnitus.   Eyes: Negative for blurred vision and double vision.  Respiratory: Positive for cough, shortness of breath and wheezing. Negative for sputum production.   Cardiovascular: Negative for chest pain, orthopnea and PND.  Gastrointestinal: Negative for nausea, vomiting, abdominal pain and diarrhea.  Genitourinary: Negative for dysuria and hematuria.  Neurological: Negative for dizziness, sensory change and focal weakness.  All other systems reviewed and are negative.   Nutrition: Heart Healthy/Carb modified Tolerating Diet: yes Tolerating PT: Eval noted.  DRUG ALLERGIES:  No Known Allergies  VITALS:  Blood pressure 124/35, pulse 53, temperature 98 F (36.7 C), temperature source Oral, resp. rate 17, height 5\' 11"  (1.803 m), weight 86.6 kg (190 lb 14.7 oz), SpO2 98 %.  PHYSICAL EXAMINATION:   Physical Exam  GENERAL:  75 y.o.-year-old patient lying in the bed in mild resp. distress.  EYES: Pupils equal, round, reactive to light and accommodation. No scleral icterus. Extraocular muscles intact.  HEENT: Head atraumatic, normocephalic. Oropharynx and nasopharynx clear.  NECK:  Supple, no jugular venous distention. No thyroid enlargement, no tenderness.  LUNGS: Poor respiratory effort, prolonged inspiratory and expiratory phase. Expiratory wheezing bilaterally, no rhonchi, rales. CARDIOVASCULAR: S1, S2 normal. No murmurs, rubs, or gallops.  ABDOMEN: Soft, nontender, nondistended. Bowel  sounds present. No organomegaly or mass.  EXTREMITIES: No cyanosis, clubbing or edema b/l.    NEUROLOGIC: Cranial nerves II through XII are intact. No focal Motor or sensory deficits b/l. Globally weak  PSYCHIATRIC: The patient is alert and oriented x 3.  SKIN: No obvious rash, lesion, or ulcer.    LABORATORY PANEL:   CBC  Recent Labs Lab 03/19/16 0401  WBC 18.9*  HGB 13.1  HCT 40.1  PLT 336   ------------------------------------------------------------------------------------------------------------------  Chemistries   Recent Labs Lab 03/18/16 0609  NA 136  K 4.4  CL 99*  CO2 30  GLUCOSE 168*  BUN 47*  CREATININE 0.99  CALCIUM 8.5*   ------------------------------------------------------------------------------------------------------------------  Cardiac Enzymes  Recent Labs Lab 03/17/16 0525  TROPONINI 0.04*   ------------------------------------------------------------------------------------------------------------------  RADIOLOGY:  No results found.   ASSESSMENT AND PLAN:   75 year old male with past medical history of COPD, hypertension, hyperlipidemia, diabetes, obesity who presented to the hospital due to shortness of breath and cough and congestion.  1. Acute respiratory failure with hypoxia-this is secondary to COPD exacerbation and pneumonia.  - cont. Oral Pred taper as per Pulmonary, cont. DuoNeb nebs around-the-clock,  Pulmicort nebs. - cont. Vancomycin.    2. COPD exacerbation-secondary to pneumonia.  Improving. - tapered from IV steroids to Oral Pred as per Pulmonary, cont. DuoNeb's, Pulmicort nebs.  - cont. Vancomycin  3. Pneumonia-continue IV vancomycin, off Zosyn.  - BC consistent with staph but this is a skin contaminant. Sputum cultures negative so far. - improving and cont. Current care.   4. Diabetes type 2 without complication-continue sliding scale insulin. BS stable.   5. Essential hypertension-continue  enalapril/HCTZ.  6. Leukocytosis-improving. Some of its steroid mediated.  And also secondary to pneumonia.  - Follow with IV antibiotic therapy.  7. Elevated troponin-likely secondary to demand ischemia from hypoxia and acute respiratory failure. Troponins have trended down.  Appreciate PT eval.  Transfer to floor today.  Possible d/c in next 2-3 days.   All the records are reviewed and case discussed with Care Management/Social Workerr. Management plans discussed with the patient, family and they are in agreement.  CODE STATUS: Full  DVT Prophylaxis: Lovenox  TOTAL TIME TAKING CARE OF THIS PATIENT: 30 minutes.   POSSIBLE D/C IN 2-3 DAYS, DEPENDING ON CLINICAL CONDITION.   Henreitta Leber M.D on 03/19/2016 at 3:12 PM  Between 7am to 6pm - Pager - [phone]  After 6pm go to [email or web address] - password [email or web address]  Office  [phone]  CC: Primary care physician; Dion Body, MD

## 2016-03-19 NOTE — Progress Notes (Signed)
PULMONARY / CRITICAL CARE MEDICINE   Name: Devin Martinez MRN: [id number] DOB: 1941/07/09    ADMISSION DATE:  03/16/2016  BRIEF HISTORY: 75 year old male past medical history of COPD/asthma, no documented PFTs, hyperlipidemia, hypertension, type 2 diabetes, who is admitted with dyspnea and cough secondary to bilateral lower lobe pneumonia. Patient states that he's been having respiratory symptoms of cough, yellow productive sputum, intermittent fever for the past week, he saw his PMD on last Thursday, was told that he may have just have an upper respiratory tract infection and treated with supportive care. However since then he is progressively worsening, upon visitation to the ED he had O2 saturations in the low 80s was tachypnea, tachycardic was placed on BiPAP and transferred to the ICU for further monitoring and observation.  SUBJECTIVE:  Wore full mask lastnight of bipap for about 4 hrs, still with productive cough, but improving.   STUDIES:  CXR 5/22 >>probable b/l infiltrates.   VITAL SIGNS: Temp:  [96.5 F (35.8 C)-98.4 F (36.9 C)] 96.5 F (35.8 C) (05/24 0800) Pulse Rate:  [40-82] 66 (05/24 1100) Resp:  [12-24] 17 (05/24 1100) BP: (94-138)/(44-120) 137/56 mmHg (05/24 1100) SpO2:  [90 %-99 %] 92 % (05/24 1100) FiO2 (%):  [40 %] 40 % (05/23 2330) HEMODYNAMICS:   VENTILATOR SETTINGS: Vent Mode:  [-]  FiO2 (%):  [40 %] 40 % INTAKE / OUTPUT:  Intake/Output Summary (Last 24 hours) at 03/19/16 1325 Last data filed at 03/19/16 1100  Gross per 24 hour  Intake    850 ml  Output   1375 ml  Net   -525 ml    Review of Systems  Constitutional: Negative for fever.  Respiratory: Positive for cough, shortness of breath and wheezing.   Cardiovascular: Negative for chest pain.  Gastrointestinal: Negative for heartburn.  Genitourinary: Negative for dysuria.  Musculoskeletal: Negative for myalgias.  Neurological: Negative for dizziness and headaches.  Endo/Heme/Allergies: Does not  bruise/bleed easily.  Psychiatric/Behavioral: Negative for depression.    Physical Exam  Constitutional: He is oriented to person, place, and time and well-developed, well-nourished, and in no distress.  HENT:  Head: Normocephalic.  Eyes: Pupils are equal, round, and reactive to light.  Neck: Normal range of motion.  Cardiovascular: Normal rate.   Pulmonary/Chest: Effort normal. He has wheezes.  Abdominal: Soft. Bowel sounds are normal.  Musculoskeletal: Normal range of motion.  Neurological: He is alert and oriented to person, place, and time.  Nursing note and vitals reviewed.   LABS:  CBC  Recent Labs Lab 03/17/16 0525 03/18/16 0609 03/19/16 0401  WBC 16.9* 19.1* 18.9*  HGB 12.8* 13.3 13.1  HCT 38.8* 40.3 40.1  PLT 294 251 336   Coag's No results for input(s): APTT, INR in the last 168 hours. BMET  Recent Labs Lab 03/16/16 1403 03/17/16 0525 03/18/16 0609  NA 136 134* 136  K 4.1 4.1 4.4  CL 97* 97* 99*  CO2 28 28 30   BUN 31* 35* 47*  CREATININE 1.15 1.06 0.99  GLUCOSE 107* 138* 168*   Electrolytes  Recent Labs Lab 03/16/16 1403 03/17/16 0525 03/18/16 0609  CALCIUM 8.9 8.3* 8.5*   Sepsis Markers No results for input(s): LATICACIDVEN, PROCALCITON, O2SATVEN in the last 168 hours. ABG  Recent Labs Lab 03/16/16 1552  PHART 7.40  PCO2ART 45  PO2ART 62*   Liver Enzymes No results for input(s): AST, ALT, ALKPHOS, BILITOT, ALBUMIN in the last 168 hours. Cardiac Enzymes  Recent Labs Lab 03/16/16 1741 03/16/16 2323 03/17/16 ID:[id number]  TROPONINI 0.14* 0.03 0.04*   Glucose  Recent Labs Lab 03/18/16 0730 03/18/16 1148 03/18/16 1617 03/18/16 2105 03/19/16 0743 03/19/16 1136  GLUCAP 142* 115* 114* 134* 97 115*    Imaging No results found.  CULTURES: 03/16/2016 Blood cultures 2. 5/22 1/2 BC with stap Blood cutlures x 2, 5/23>> Sputum culture MRSA screen-positive  ANTIBIOTICS: Ceftriaxone 03/16/2016>5/22 Azithromycin  03/16/2016>5/22 Vanc 5/22 Zosyn 5/22>5/23  ASSESSMENT / PLAN: PULMONARY A: Acute hypoxic respiratory failure-improving Acute COPD exacerbation Community-acquired pneumonia P:  - anxiety/clustraphobia with bipap mask, tolerated for 4 hrs lastnight with dose of alprazolam. Still with wheezing and cough but slowly improving. Bipap compliance will help his overall respiratory status.  - Bipap QHS inpt only .  -Empiric antibiotics. -Nebulized bronchodilators -cxr prn -ABG when necessary -Smoking cessation advice given -IV steroids, transition to PO steroids and taper over 12 days - ICS, flutter valve.  - [facility] will call patient with follow up appointment.   CARDIOVASCULAR A:  dyspnea Mildly elevated troponin-likely due to demand ischemia History of hypertension History of hyperlipidemia P:  -2-D echo > EF 60- 65%, mild RV dil -Continue home blood pressure medications. -Hemodynamic monitoring per ICU protocol  RENAL A:  No acute issues P:  -Monitor and replace electrolytes  GASTROINTESTINAL A:  No acute issues P:  -soft diet/clears -prn nausea meds  HEMATOLOGIC A:  No acute issues P:  -Lovenox for VTE prophylaxis  INFECTIOUS A:  Community-acquired pneumonia P:  -Broad-spectrum antibiotics. -Follow-up cultures> 1/2 BC with stap, reculture  ENDOCRINE A:  Type 2 diabetes P:  -Monitor blood glucose with sliding scale insulin coverage. -Hold oral hypoglycemic agents while nothing by mouth  Stable to transfer out of the ICU, PCCM will follow in consults.    Thank you for consulting [facility], Please feel free to contacts [place] with any questions at [phone] (please enter 7-digits).  Pulmonary Consult time devoted to patient care services described in this note is 35 minutes.  Overall, patient is critically ill, prognosis is guarded. Patient at high risk for cardiac arrest and death.    Vilinda Boehringer, MD [facility] Pager [phone] (please enter 7-digits) On Call Pager [phone] (please enter 7-digits)  Note: This note was prepared with Dragon dictation along with smaller phrase technology. Any transcriptional errors that result from this process are unintentional.

## 2016-03-19 NOTE — Progress Notes (Signed)
Patient transferred to unit. Received report from [facility], RN. Patient VSS, alert and oriented, no complaints of pain. Patient O2 sats 95% on 2L via nasal cannula. Patient takes meds whole. Patient has no evidence of wounds or skin tears. Skin assessed with Carlyle Dolly. Patient oriented to room. Will continue to monitor Horton Finer

## 2016-03-19 NOTE — Care Management (Signed)
CM assessment for discharge planning. TC to daughter. She states patient lives at home alone. Up until his admission he was independent, active and driving. He required no assistive devices and recently retired. No home O2. Admitted with COPD exacerbation and PNA. WBC 18.9. Currently on 3 L. Will need to be assessed closer to DC for home O2. PT consult recommendations pending. PCP is Dr. Netty Starring.

## 2016-03-19 NOTE — Plan of Care (Signed)
Problem: Phase I Progression Outcomes Goal: Progress activity as tolerated unless otherwise ordered Outcome: Completed/Met Date Met:  03/19/16 Patient ambulated to the bathroom with a standard walker

## 2016-03-20 DIAGNOSIS — J189 Pneumonia, unspecified organism: Secondary | ICD-10-CM | POA: Diagnosis not present

## 2016-03-20 DIAGNOSIS — J441 Chronic obstructive pulmonary disease with (acute) exacerbation: Secondary | ICD-10-CM | POA: Diagnosis not present

## 2016-03-20 DIAGNOSIS — J9601 Acute respiratory failure with hypoxia: Secondary | ICD-10-CM | POA: Diagnosis not present

## 2016-03-20 DIAGNOSIS — R748 Abnormal levels of other serum enzymes: Secondary | ICD-10-CM | POA: Diagnosis not present

## 2016-03-20 DIAGNOSIS — R0600 Dyspnea, unspecified: Secondary | ICD-10-CM | POA: Diagnosis not present

## 2016-03-20 LAB — GLUCOSE, CAPILLARY
Glucose-Capillary: 99 mg/dL (ref 65–99)
Glucose-Capillary: 118 mg/dL — ABNORMAL HIGH (ref 65–99)
Glucose-Capillary: 181 mg/dL — ABNORMAL HIGH (ref 65–99)
Glucose-Capillary: 172 mg/dL — ABNORMAL HIGH (ref 65–99)

## 2016-03-20 LAB — CBC
HCT: 40.3 % (ref 40.0–52.0)
Hemoglobin: 13.2 g/dL (ref 13.0–18.0)
MCH: 28.6 pg (ref 26.0–34.0)
MCHC: 32.8 g/dL (ref 32.0–36.0)
MCV: 87.1 fL (ref 80.0–100.0)
Platelets: 292 10*3/uL (ref 150–440)
RBC: 4.63 MIL/uL (ref 4.40–5.90)
RDW: 14.4 % (ref 11.5–14.5)
WBC: 12.4 10*3/uL — ABNORMAL HIGH (ref 3.8–10.6)

## 2016-03-20 MED ORDER — AZITHROMYCIN 250 MG PO TABS
500.0000 mg | ORAL_TABLET | Freq: Every day | ORAL | Status: DC
  Administered 2016-03-20 – 2016-03-23 (×4): 500 mg via ORAL
  Filled 2016-03-20 (×4): qty 2

## 2016-03-20 MED ORDER — METHYLPREDNISOLONE SODIUM SUCC 125 MG IJ SOLR
60.0000 mg | INTRAMUSCULAR | Status: DC
  Administered 2016-03-20 – 2016-03-22 (×3): 60 mg via INTRAVENOUS
  Filled 2016-03-20 (×3): qty 2

## 2016-03-20 NOTE — Care Management Important Message (Signed)
Important Message  Patient Details  Name: Devin Martinez MRN: [id number] Date of Birth: Oct 16, 1941   Medicare Important Message Given:  Yes    Jolly Mango, RN 03/20/2016, 11:34 AM

## 2016-03-20 NOTE — Progress Notes (Signed)
[facility] at [place] NAME: Devin Martinez    MR#:  [id number]  DATE OF BIRTH:  14-Oct-1941  SUBJECTIVE:  Still shortness of breath on minimal ambulation  and has some wheezing. On 3 L of oxygen saturating 91%  REVIEW OF SYSTEMS:    Review of Systems  Constitutional: Negative for fever and chills.  HENT: Negative for congestion and tinnitus.   Eyes: Negative for blurred vision and double vision.  Respiratory: Positive for cough, shortness of breath and wheezing. Negative for sputum production.   Cardiovascular: Negative for chest pain, orthopnea and PND.  Gastrointestinal: Negative for nausea, vomiting, abdominal pain and diarrhea.  Genitourinary: Negative for dysuria and hematuria.  Neurological: Negative for dizziness, sensory change and focal weakness.  All other systems reviewed and are negative.   Nutrition: Heart Healthy/Carb modified Tolerating Diet: yes Tolerating PT: Eval noted.  DRUG ALLERGIES:  No Known Allergies  VITALS:  Blood pressure 174/69, pulse 94, temperature 98.2 F (36.8 C), temperature source Oral, resp. rate 18, height 5\' 11"  (1.803 m), weight 86.6 kg (190 lb 14.7 oz), SpO2 91 %.  PHYSICAL EXAMINATION:   Physical Exam  GENERAL:  75 y.o.-year-old patient lying in the bed in mild resp. distress.  EYES: Pupils equal, round, reactive to light and accommodation. No scleral icterus. Extraocular muscles intact.  HEENT: Head atraumatic, normocephalic. Oropharynx and nasopharynx clear.  NECK:  Supple, no jugular venous distention. No thyroid enlargement, no tenderness.  LUNGS: Poor respiratory effort, prolonged inspiratory and expiratory phase. Expiratory wheezing bilaterally, no rhonchi, rales. CARDIOVASCULAR: S1, S2 normal. No murmurs, rubs, or gallops.  ABDOMEN: Soft, nontender, nondistended. Bowel sounds present. No organomegaly or mass.  EXTREMITIES: No cyanosis, clubbing or edema b/l.    NEUROLOGIC: Cranial nerves  II through XII are intact. No focal Motor or sensory deficits b/l. Globally weak  PSYCHIATRIC: The patient is alert and oriented x 3.  SKIN: No obvious rash, lesion, or ulcer.    LABORATORY PANEL:   CBC  Recent Labs Lab 03/20/16 0548  WBC 12.4*  HGB 13.2  HCT 40.3  PLT 292   ------------------------------------------------------------------------------------------------------------------  Chemistries   Recent Labs Lab 03/18/16 0609  NA 136  K 4.4  CL 99*  CO2 30  GLUCOSE 168*  BUN 47*  CREATININE 0.99  CALCIUM 8.5*   ------------------------------------------------------------------------------------------------------------------  Cardiac Enzymes  Recent Labs Lab 03/17/16 0525  TROPONINI 0.04*   ------------------------------------------------------------------------------------------------------------------  RADIOLOGY:  No results found.   ASSESSMENT AND PLAN:   75 year old male with past medical history of COPD, hypertension, hyperlipidemia, diabetes, obesity who presented to the hospital due to shortness of breath and cough and congestion.  1. Acute respiratory failure with hypoxia-this is secondary to COPD exacerbation and pneumonia.  - She still has lots of wheezing start IV steroids today cont. DuoNeb nebs around-the-clock,  Pulmicort nebs. - cont. Vancomycin.   Clinically improving slowly. 2. COPD exacerbation-secondary to pneumonia.  Improving. - Due to IV steroids today cont. DuoNeb's, Pulmicort nebs.  - cont. Vancomycin  3. Pneumonia-continue IV vancomycin, off Zosyn.  - BC consistent with staph but this is a skin contaminant. Sputum cultures negative so far. - improving and cont. Current care.   4. Diabetes type 2 without complication-continue sliding scale insulin. BS stable.   5. Essential hypertension-continue enalapril/HCTZ.  6. Leukocytosis-improving. Some of its steroid mediated. And also secondary to pneumonia.  - Follow with IV  antibiotic therapy.  7. Elevated troponin-likely secondary to demand ischemia from hypoxia and  acute respiratory failure. Troponins have trended down.  Appreciate PT eval.  Transfer to floor today.  Possible d/c in next 2-3 days.   All the records are reviewed and case discussed with Care Management/Social Workerr. Management plans discussed with the patient, family and they are in agreement.  CODE STATUS: Full  DVT Prophylaxis: Lovenox  TOTAL TIME TAKING CARE OF THIS PATIENT: 30 minutes.   POSSIBLE D/C IN 2-3 DAYS, DEPENDING ON CLINICAL CONDITION.   Epifanio Lesches M.D on 03/20/2016 at 1:47 PM  Between 7am to 6pm - Pager - [phone]  After 6pm go to [email or web address] - password [email or web address] [facility]  Office  [phone]  CC: Primary care physician; Dion Body, MD

## 2016-03-20 NOTE — Progress Notes (Signed)
* [facility]     Assessment and Plan:  75 year old male with the COPD, now presents with COPD exacerbation and pneumonia.  PULMONARY A: Acute hypoxic respiratory failure-improving now on 3 L. Acute COPD exacerbation Community-acquired pneumonia P:   -Empiric antibiotics. -Nebulized bronchodilators -cxr prn -ABG when necessary -Smoking cessation advice given -IV steroids, transition to PO steroids and taper. - [facility] will call patient with follow up appointment.    Date: 03/20/2016  MRN# [id number] Devin Martinez November 13, 1940   Devin Martinez is a 75 y.o. old male seen in follow up for chief complaint of  Chief Complaint  Patient presents with  . Shortness of Breath  . Cough     HPI:   Patient feeling better today than on previous days. No new complaints.    Allergies:  Review of patient's allergies indicates no known allergies.  Review of Systems: Gen:  Denies  fever, sweats. HEENT: Denies blurred vision. Cvc:  No dizziness, chest pain or heaviness Resp:   Denies cough or sputum porduction. Gi: Denies swallowing difficulty, stomach pain. constipation, bowel incontinence Gu:  Denies bladder incontinence, burning urine Ext:   No Joint pain, stiffness. Skin: No skin rash, easy bruising. Endoc:  No polyuria, polydipsia. Psych: No depression, insomnia. Other:  All other systems were reviewed and found to be negative other than what is mentioned in the HPI.   Physical Examination:   VS: BP 174/69 mmHg  Pulse 94  Temp(Src) 98.2 F (36.8 C) (Oral)  Resp 18  Ht 5\' 11"  (1.803 m)  Wt 190 lb 14.7 oz (86.6 kg)  BMI 26.64 kg/m2  SpO2 91%  General Appearance: No distress  Neuro:without focal findings,  speech normal,  HEENT: PERRLA, EOM intact. Pulmonary: normal breath sounds, No wheezing.  Decreased air entry bilaterally. CardiovascularNormal S1,S2.  No m/r/g.   Abdomen: Benign, Soft, non-tender. Renal:  No costovertebral  tenderness  GU:  Not performed at this time. Endoc: No evident thyromegaly, no signs of acromegaly. Skin:   warm, no rash. Extremities: normal, no cyanosis, clubbing.   LABORATORY PANEL:   CBC  Recent Labs Lab 03/20/16 0548  WBC 12.4*  HGB 13.2  HCT 40.3  PLT 292   ------------------------------------------------------------------------------------------------------------------  Chemistries   Recent Labs Lab 03/18/16 0609  NA 136  K 4.4  CL 99*  CO2 30  GLUCOSE 168*  BUN 47*  CREATININE 0.99  CALCIUM 8.5*   ------------------------------------------------------------------------------------------------------------------  Cardiac Enzymes  Recent Labs Lab 03/17/16 0525  TROPONINI 0.04*   ------------------------------------------------------------  RADIOLOGY:   No results found for this or any previous visit. Results for orders placed during the hospital encounter of 03/16/16  DG Chest 2 View   Narrative CLINICAL DATA:  Shortness of breath  EXAM: CHEST  2 VIEW  COMPARISON:  01/30/2015 chest radiograph.  FINDINGS: Stable cardiomediastinal silhouette with normal heart size. No pneumothorax. No pleural effusion. Hyperinflated lungs. New hazy opacity in the bilateral lower lobes. No pulmonary edema.  IMPRESSION: 1. New hazy opacity in the bilateral lower lobes, suggestive of infectious bronchiolitis/bronchopneumonia. 2. Hyperinflated lungs, suggesting COPD.   Electronically Signed   By: Ilona Sorrel M.D.   On: 03/16/2016 14:46    ------------------------------------------------------------------------------------------------------------------  Thank  you for allowing [facility] to assist in the care of your patient. Our recommendations are noted above.  Please contact us if we can be of further service.   Marda Stalker, MD.  [facility] Office Number: [phone]  [id number]  Patricia Pesa, M.D.    Vilinda Boehringer, M.D.  Merton Border, M.D  03/20/2016

## 2016-03-21 ENCOUNTER — Inpatient Hospital Stay: Payer: PPO

## 2016-03-21 DIAGNOSIS — J9690 Respiratory failure, unspecified, unspecified whether with hypoxia or hypercapnia: Secondary | ICD-10-CM | POA: Diagnosis not present

## 2016-03-21 DIAGNOSIS — R748 Abnormal levels of other serum enzymes: Secondary | ICD-10-CM | POA: Diagnosis not present

## 2016-03-21 DIAGNOSIS — R0600 Dyspnea, unspecified: Secondary | ICD-10-CM | POA: Diagnosis not present

## 2016-03-21 DIAGNOSIS — J9611 Chronic respiratory failure with hypoxia: Secondary | ICD-10-CM | POA: Diagnosis not present

## 2016-03-21 DIAGNOSIS — J189 Pneumonia, unspecified organism: Secondary | ICD-10-CM | POA: Diagnosis not present

## 2016-03-21 DIAGNOSIS — J441 Chronic obstructive pulmonary disease with (acute) exacerbation: Secondary | ICD-10-CM | POA: Diagnosis not present

## 2016-03-21 DIAGNOSIS — R0609 Other forms of dyspnea: Secondary | ICD-10-CM | POA: Diagnosis not present

## 2016-03-21 DIAGNOSIS — J9601 Acute respiratory failure with hypoxia: Secondary | ICD-10-CM | POA: Diagnosis not present

## 2016-03-21 LAB — GLUCOSE, CAPILLARY
Glucose-Capillary: 84 mg/dL (ref 65–99)
Glucose-Capillary: 92 mg/dL (ref 65–99)
Glucose-Capillary: 130 mg/dL — ABNORMAL HIGH (ref 65–99)
Glucose-Capillary: 260 mg/dL — ABNORMAL HIGH (ref 65–99)

## 2016-03-21 LAB — CREATININE, SERUM
Creatinine, Ser: 0.69 mg/dL (ref 0.61–1.24)
GFR calc Af Amer: >60 mL/min (ref >60)
GFR calc non Af Amer: >60 mL/min (ref >60)

## 2016-03-21 LAB — CULTURE, BLOOD (ROUTINE X 2): Culture: NO GROWTH

## 2016-03-21 MED ORDER — ZOLPIDEM TARTRATE 5 MG PO TABS
5.0000 mg | ORAL_TABLET | Freq: Once | ORAL | Status: AC
  Administered 2016-03-21: 5 mg via ORAL
  Filled 2016-03-21: qty 1

## 2016-03-21 NOTE — Progress Notes (Signed)
SATURATION QUALIFICATIONS: (This note is used to comply with regulatory documentation for home oxygen)  Patient Saturations on Room Air at Rest = 87%  Patient Saturations on Room Air while Ambulating = %  Patient Saturations on 2 Liters of oxygen while resting = 92%  Please briefly explain why patient needs home oxygen:

## 2016-03-21 NOTE — Care Management (Addendum)
PT recommending home PT. Would benefit from short term SN. Discussed this with patient. Provided a choice of home health agencies. He has no agency preference. Referral to [facility]. Requested primary nurse get qualifying O2 sats. For home O2. Requested MD put in [facility] and O2 orders. No other DME needed.

## 2016-03-21 NOTE — Progress Notes (Signed)
[place] at [place] NAME: Quince Lamendola    MR#:  [id number]  DATE OF BIRTH:  1941-07-07  SUBJECTIVE:  Doing better today. Has some cough and desatted while ambulating.  REVIEW OF SYSTEMS:    Review of Systems  Constitutional: Negative for fever and chills.  HENT: Negative for congestion and tinnitus.   Eyes: Negative for blurred vision and double vision.  Respiratory: Positive for cough and shortness of breath. Negative for sputum production and wheezing.   Cardiovascular: Negative for chest pain, orthopnea and PND.  Gastrointestinal: Negative for nausea, vomiting, abdominal pain and diarrhea.  Genitourinary: Negative for dysuria and hematuria.  Neurological: Negative for dizziness, sensory change and focal weakness.  All other systems reviewed and are negative.   Nutrition: Heart Healthy/Carb modified Tolerating Diet: yes Tolerating PT: Eval noted.  DRUG ALLERGIES:  No Known Allergies  VITALS:  Blood pressure 157/61, pulse 96, temperature 97.7 F (36.5 C), temperature source Oral, resp. rate 18, height 5\' 11"  (1.803 m), weight 86.6 kg (190 lb 14.7 oz), SpO2 92 %.  PHYSICAL EXAMINATION:   Physical Exam  GENERAL:  75 y.o.-year-old patient lying in the bed in mild resp. distress.  EYES: Pupils equal, round, reactive to light and accommodation. No scleral icterus. Extraocular muscles intact.  HEENT: Head atraumatic, normocephalic. Oropharynx and nasopharynx clear.  NECK:  Supple, no jugular venous distention. No thyroid enlargement, no tenderness.  LUNGS: Poor respiratory effort, Less wheezing today., no rhonchi, rales. CARDIOVASCULAR: S1, S2 normal. No murmurs, rubs, or gallops.  ABDOMEN: Soft, nontender, nondistended. Bowel sounds present. No organomegaly or mass.  EXTREMITIES: No cyanosis, clubbing or edema b/l.    NEUROLOGIC: Cranial nerves II through XII are intact. No focal Motor or sensory deficits b/l. Globally weak   PSYCHIATRIC: The patient is alert and oriented x 3.  SKIN: No obvious rash, lesion, or ulcer.    LABORATORY PANEL:   CBC  Recent Labs Lab 03/20/16 0548  WBC 12.4*  HGB 13.2  HCT 40.3  PLT 292   ------------------------------------------------------------------------------------------------------------------  Chemistries   Recent Labs Lab 03/18/16 0609 03/21/16 0543  NA 136  --   K 4.4  --   CL 99*  --   CO2 30  --   GLUCOSE 168*  --   BUN 47*  --   CREATININE 0.99 0.69  CALCIUM 8.5*  --    ------------------------------------------------------------------------------------------------------------------  Cardiac Enzymes  Recent Labs Lab 03/17/16 0525  TROPONINI 0.04*   ------------------------------------------------------------------------------------------------------------------  RADIOLOGY:  Dg Chest 2 View  03/21/2016  CLINICAL DATA:  Pt has respiratory failure. Hx of hypertension, COPD, diabetes, asthma. Current some day smoker. EXAM: CHEST  2 VIEW COMPARISON:  03/17/2016 FINDINGS: Heart size is normal. Prominent interstitial markings are again noted throughout the lungs and appear stable. No focal consolidations. Minimal right basilar pleural change noted. IMPRESSION: Minimal right lower lobe pleural thickening or pleural fluid. Stable prominence of interstitial markings. Electronically Signed   By: Nolon Nations M.D.   On: 03/21/2016 09:14     ASSESSMENT AND PLAN:   75 year old male with past medical history of COPD, hypertension, hyperlipidemia, diabetes, obesity who presented to the hospital due to shortness of breath and cough and congestion.  1. Acute respiratory failure with hypoxia-this is secondary to COPD exacerbation and pneumonia.  - Equally improvement. Chest x-ray also showed improvement. Continue antibiotics, prednisone, nebulizers. Discharge home with oxygen tomorrow. Clinically improving slowly. 2. COPD exacerbation-secondary to  pneumonia.  Improving. -  3. Pneumonia-continue IV vancomycin, off Zosyn.  - BC consistent with staph but this is a skin contaminant. Sputum cultures negative so far. - improving and cont. Current care.   4. Diabetes type 2 without complication-continue sliding scale insulin. BS stable.   5. Essential hypertension-continue enalapril/HCTZ.  6. Leukocytosis-improving. Some of its steroid mediated. And also secondary to pneumonia.  - Follow with IV antibiotic therapy.  7. Elevated troponin-likely secondary to demand ischemia from hypoxia and acute respiratory failure. Troponins have trended down.    All the records are reviewed and case discussed with Care Management/Social Workerr. Management plans discussed with the patient, family and they are in agreement.  CODE STATUS: Full  DVT Prophylaxis: Lovenox  TOTAL TIME TAKING CARE OF THIS PATIENT: 30 minutes.   POSSIBLE D/C IN 2-3 DAYS, DEPENDING ON CLINICAL CONDITION.   Epifanio Lesches M.D on 03/21/2016 at 12:45 PM  Between 7am to 6pm - Pager - [phone]  After 6pm go to [email or web address] - password [email or web address] [facility]  Office  [phone]  CC: Primary care physician; Dion Body, MD

## 2016-03-21 NOTE — Progress Notes (Signed)
Physical Therapy Treatment Patient Details Name: Devin Martinez MRN: [id number] DOB: 1941-05-28 Today's Date: 03/21/2016    History of Present Illness Pt is a 75 y.o. male presenting with SOB and productive cough x4-5 days.  Pt admitted to hospital with acute respiratory distress secondary to COPD exacerbation and B PNA.  PMH includes obesity, LVH, htn, COPD, degenerative arthritis L knee, h/o smoking, DM, asthma.    PT Comments    Pt able to ambulate short distances with RW SBA to CGA (limited distance d/t SOB and increased work of breathing with exertion).  Pt's O2 91% on 3 L/min O2 via nasal cannula post ambulation most of session but after pt toileted end of session pt's O2 88% (on 3 L/min O2) and required a few minutes of vc's for breathing technique to increase O2 back to 90%.  Pt asking for breathing treatment (RT called and notified of above O2 saturations, SOB, and pt asking for breathing treatment; nursing notified as well).  Will continue to progress pt with activity tolerance and increasing ambulation distance with least restrictive AD per pt tolerance.   Follow Up Recommendations  Home health PT     Equipment Recommendations  Rolling walker with 5" wheels    Recommendations for Other Services       Precautions / Restrictions Precautions Precautions: Fall Restrictions Weight Bearing Restrictions: No    Mobility  Bed Mobility Overal bed mobility: Needs Assistance Bed Mobility: Supine to Sit;Sit to Supine     Supine to sit: Modified independent (Device/Increase time);HOB elevated Sit to supine: Modified independent (Device/Increase time);HOB elevated      Transfers Overall transfer level: Needs assistance Equipment used: Rolling walker (2 wheeled) Transfers: Sit to/from Stand Sit to Stand: Supervision         General transfer comment: steady without loss of balance; strong stand noted each time  Ambulation/Gait Ambulation/Gait assistance: Supervision;Min  guard Ambulation Distance (Feet):  (40 feet x4; 20 feet x2 (to bathroom and back)) Assistive device: Rolling walker (2 wheeled) Gait Pattern/deviations: Step-through pattern Gait velocity: mildly decreased   General Gait Details: steady with use of RW; limited distance each trial to minimize SOB/WOB   Stairs            Wheelchair Mobility    Modified Rankin (Stroke Patients Only)       Balance Overall balance assessment: Needs assistance Sitting-balance support: No upper extremity supported;Feet supported Sitting balance-Leahy Scale: Normal     Standing balance support: Bilateral upper extremity supported (on RW) Standing balance-Leahy Scale: Good Standing balance comment: pt stood to urinate at toilet (without UE support) and pt SBA (steady without loss of balance)                    Cognition Arousal/Alertness: Awake/alert Behavior During Therapy: WFL for tasks assessed/performed Overall Cognitive Status: Within Functional Limits for tasks assessed                      Exercises      General Comments   Nursing cleared pt for participation in physical therapy.  Pt agreeable to PT session.      Pertinent Vitals/Pain Pain Assessment: No/denies pain    Home Living                      Prior Function            PT Goals (current goals can now be found in the care plan  section) Acute Rehab PT Goals Patient Stated Goal: to get OOB and walk PT Goal Formulation: With patient Time For Goal Achievement: 04/01/16 Potential to Achieve Goals: Good Progress towards PT goals: Progressing toward goals    Frequency  Min 2X/week    PT Plan Current plan remains appropriate    Co-evaluation             End of Session Equipment Utilized During Treatment: Gait belt;Oxygen (3 L/min via nasal cannula) Activity Tolerance: Other (comment) (Limited d/t SOB) Patient left: in bed;with call bell/phone within reach;with bed alarm set (MD in  room)     Time: [id number] PT Time Calculation (min) (ACUTE ONLY): 27 min  Charges:  $Therapeutic Exercise: 23-37 mins                    G CodesLeitha Bleak 2016/03/30, 12:00 PM Leitha Bleak, August

## 2016-03-21 NOTE — Progress Notes (Signed)
Pt requesting medication to help him sleep. MD Vachhani notified. Verbal order given for Ambien 5 mg once. RN will put in order, and administer medication.   Devin Martinez M

## 2016-03-21 NOTE — Progress Notes (Signed)
* Devin Martinez     Assessment and Plan:  75 year old male with the COPD, now presents with COPD exacerbation and pneumonia.  PULMONARY A: Acute hypoxic respiratory failure-improving now on 3 L. Acute COPD exacerbation Community-acquired pneumonia P:   -cont with PO abx - can give a total of 10 days.  -Nebulized bronchodilators -cxr prn -ABG when necessary -Smoking cessation advice given -IV steroids, transition to PO steroids today (can start at 40mg ) and taper over 2 weeks - 6 minute walk test to determine O2 needs - may require oxygen upon discharge  - does NOT require Bipap/Cpap as an outpateint - physical therapy - overall with good clinical improvement, mild wheezing noted, but significantly improved.  - [facility] will call patient with follow up appointment.   Date: 03/21/2016  MRN# [id number] Devin Martinez [birth year]   Devin Martinez is a 75 y.o. old male seen in follow up for chief complaint of  Chief Complaint  Patient presents with  . Shortness of Breath  . Cough     HPI:   Patient feeling better today than on previous days. No new complaints.  Still with cough but much improved    Allergies:  Review of patient's allergies indicates no known allergies.  Review of Systems: Gen:  Denies  fever, sweats. HEENT: Denies blurred vision. Cvc:  No dizziness, chest pain or heaviness Resp:   Denies cough or sputum porduction. Gi: Denies swallowing difficulty, stomach pain. constipation, bowel incontinence Gu:  Denies bladder incontinence, burning urine Ext:   No Joint pain, stiffness. Skin: No skin rash, easy bruising. Endoc:  No polyuria, polydipsia. Psych: No depression, insomnia. Other:  All other systems were reviewed and found to be negative other than what is mentioned in the HPI.   Physical Examination:   VS: BP 157/61 mmHg  Pulse 67  Temp(Src) 97.7 F (36.5 C) (Oral)  Resp 18  Ht 5\' 11"  (1.803 m)  Wt 190 lb 14.7 oz  (86.6 kg)  BMI 26.64 kg/m2  SpO2 95%  General Appearance: No distress  Neuro:without focal findings,  speech normal,  HEENT: PERRLA, EOM intact. Pulmonary: good respiratory effort, now with faint expiratory wheezes (over the left lung field - this is an improvement).  CardiovascularNormal S1,S2.  No m/r/g.   Abdomen: Benign, Soft, non-tender. Renal:  No costovertebral tenderness  GU:  Not performed at this time. Endoc: No evident thyromegaly, no signs of acromegaly. Skin:   warm, no rash. Extremities: normal, no cyanosis, clubbing.   LABORATORY PANEL:   CBC  Recent Labs Lab 03/20/16 0548  WBC 12.4*  HGB 13.2  HCT 40.3  PLT 292   ------------------------------------------------------------------------------------------------------------------  Chemistries   Recent Labs Lab 03/18/16 0609 03/21/16 0543  NA 136  --   K 4.4  --   CL 99*  --   CO2 30  --   GLUCOSE 168*  --   BUN 47*  --   CREATININE 0.99 0.69  CALCIUM 8.5*  --    ------------------------------------------------------------------------------------------------------------------  Cardiac Enzymes  Recent Labs Lab 03/17/16 0525  TROPONINI 0.04*   ------------------------------------------------------------  RADIOLOGY:   No results found for this or any previous visit. Results for orders placed during the hospital encounter of 03/16/16  DG Chest 2 View   Narrative CLINICAL DATA:  Shortness of breath  EXAM: CHEST  2 VIEW  COMPARISON:  01/30/2015 chest radiograph.  FINDINGS: Stable cardiomediastinal silhouette with normal heart size. No pneumothorax. No pleural effusion. Hyperinflated lungs. New hazy  opacity in the bilateral lower lobes. No pulmonary edema.  IMPRESSION: 1. New hazy opacity in the bilateral lower lobes, suggestive of infectious bronchiolitis/bronchopneumonia. 2. Hyperinflated lungs, suggesting COPD.   Electronically Signed   By: Ilona Sorrel M.D.   On: 03/16/2016  14:46    ------------------------------------------------------------------------------------------------------------------  Thank  you for allowing [facility] to assist in the care of your patient. Our recommendations are noted above.  Please contact us if we can be of further service.  Pulmonary Consult time - 30 mins   Vilinda Boehringer, MD [facility] Pager [phone] (please enter 7-digits) On Call Pager - [phone] (please enter 7-digits) Clinic - [phone]   03/21/2016

## 2016-03-22 DIAGNOSIS — J189 Pneumonia, unspecified organism: Secondary | ICD-10-CM | POA: Diagnosis not present

## 2016-03-22 DIAGNOSIS — R748 Abnormal levels of other serum enzymes: Secondary | ICD-10-CM | POA: Diagnosis not present

## 2016-03-22 DIAGNOSIS — J9601 Acute respiratory failure with hypoxia: Secondary | ICD-10-CM | POA: Diagnosis not present

## 2016-03-22 DIAGNOSIS — J441 Chronic obstructive pulmonary disease with (acute) exacerbation: Secondary | ICD-10-CM | POA: Diagnosis not present

## 2016-03-22 LAB — GLUCOSE, CAPILLARY
Glucose-Capillary: 88 mg/dL (ref 65–99)
Glucose-Capillary: 129 mg/dL — ABNORMAL HIGH (ref 65–99)

## 2016-03-22 LAB — CULTURE, BLOOD (ROUTINE X 2)

## 2016-03-22 MED ORDER — ALPRAZOLAM 0.25 MG PO TABS
0.5000 mg | ORAL_TABLET | Freq: Once | ORAL | Status: AC
  Administered 2016-03-22: 0.5 mg via ORAL
  Filled 2016-03-22: qty 2

## 2016-03-22 NOTE — Progress Notes (Signed)
Pt becoming anxious, and sob. MD Crosley notified. Verbal order given for 0.5 mg Xanax once. RN to place order. Will continue to monitor.  Iran Sizer M

## 2016-03-22 NOTE — Progress Notes (Signed)
[place] at [place] NAME: Devin Martinez    MR#:  [id number]  DATE OF BIRTH:  1941/05/02  SUBJECTIVE:  She has cough, wheezing. Not ready for discharge  Yet. O2 sats 90% on 3 L at rest  REVIEW OF SYSTEMS:    Review of Systems  Constitutional: Negative for fever and chills.  HENT: Negative for congestion and tinnitus.   Eyes: Negative for blurred vision and double vision.  Respiratory: Positive for cough, shortness of breath and wheezing. Negative for sputum production.   Cardiovascular: Negative for chest pain, orthopnea and PND.  Gastrointestinal: Negative for nausea, vomiting, abdominal pain and diarrhea.  Genitourinary: Negative for dysuria and hematuria.  Neurological: Negative for dizziness, sensory change and focal weakness.  All other systems reviewed and are negative.   Nutrition: Heart Healthy/Carb modified Tolerating Diet: yes Tolerating PT: Eval noted.  DRUG ALLERGIES:  No Known Allergies  VITALS:  Blood pressure 140/72, pulse 86, temperature 97.9 F (36.6 C), temperature source Oral, resp. rate 18, height 5\' 11"  (1.803 m), weight 86.6 kg (190 lb 14.7 oz), SpO2 93 %.  PHYSICAL EXAMINATION:   Physical Exam  GENERAL:  75 y.o.-year-old patient lying in the bed in mild resp. distress.  EYES: Pupils equal, round, reactive to light and accommodation. No scleral icterus. Extraocular muscles intact.  HEENT: Head atraumatic, normocephalic. Oropharynx and nasopharynx clear.  NECK:  Supple, no jugular venous distention. No thyroid enlargement, no tenderness.  LUNGS: Poor respiratory effort, Less wheezing today., no rhonchi, rales. CARDIOVASCULAR: S1, S2 normal. No murmurs, rubs, or gallops.  ABDOMEN: Soft, nontender, nondistended. Bowel sounds present. No organomegaly or mass.  EXTREMITIES: No cyanosis, clubbing or edema b/l.    NEUROLOGIC: Cranial nerves II through XII are intact. No focal Motor or sensory deficits b/l. Globally  weak  PSYCHIATRIC: The patient is alert and oriented x 3.  SKIN: No obvious rash, lesion, or ulcer.    LABORATORY PANEL:   CBC  Recent Labs Lab 03/20/16 0548  WBC 12.4*  HGB 13.2  HCT 40.3  PLT 292   ------------------------------------------------------------------------------------------------------------------  Chemistries   Recent Labs Lab 03/18/16 0609 03/21/16 0543  NA 136  --   K 4.4  --   CL 99*  --   CO2 30  --   GLUCOSE 168*  --   BUN 47*  --   CREATININE 0.99 0.69  CALCIUM 8.5*  --    ------------------------------------------------------------------------------------------------------------------  Cardiac Enzymes  Recent Labs Lab 03/17/16 0525  TROPONINI 0.04*   ------------------------------------------------------------------------------------------------------------------  RADIOLOGY:  Dg Chest 2 View  03/21/2016  CLINICAL DATA:  Pt has respiratory failure. Hx of hypertension, COPD, diabetes, asthma. Current some day smoker. EXAM: CHEST  2 VIEW COMPARISON:  03/17/2016 FINDINGS: Heart size is normal. Prominent interstitial markings are again noted throughout the lungs and appear stable. No focal consolidations. Minimal right basilar pleural change noted. IMPRESSION: Minimal right lower lobe pleural thickening or pleural fluid. Stable prominence of interstitial markings. Electronically Signed   By: Nolon Nations M.D.   On: 03/21/2016 09:14     ASSESSMENT AND PLAN:   75 year old male with past medical history of COPD, hypertension, hyperlipidemia, diabetes, obesity who presented to the hospital due to shortness of breath and cough and congestion.  1. Acute respiratory failure with hypoxia-this is secondary to COPD exacerbation and pneumonia.  - Equally improvement. Chest x-ray also showed improvement. Continue antibiotics, prednisone, nebulizers.  Clinically improving slowly. 2. COPD exacerbation-secondary to pneumonia.  Improving slowly. -  3. Pneumonia-continue IV vancomycin, off Zosyn.  possible discharge tomorrow with antibiotics for 10 days, 2 week taper of prednisone, home oxygen, physical therapy - BC consistent with staph but this is a skin contaminant. Sputum cultures negative so far. - improving and cont. Current care.   4. Diabetes type 2 without complication-continue sliding scale insulin. BS stable.   5. Essential hypertension-continue enalapril/HCTZ.  6. Leukocytosis-improving. Some of its steroid mediated. And also secondary to pneumonia.    7. Elevated troponin-likely secondary to demand ischemia from hypoxia and acute respiratory failure. Troponins have trended down.    All the records are reviewed and case discussed with Care Management/Social Workerr. Management plans discussed with the patient, family and they are in agreement.  CODE STATUS: Full  DVT Prophylaxis: Lovenox  TOTAL TIME TAKING CARE OF THIS PATIENT: 30 minutes.   POSSIBLE D/C IN 2-3 DAYS, DEPENDING ON CLINICAL CONDITION.   Epifanio Lesches M.D on 03/22/2016 at 11:48 AM  Between 7am to 6pm - Pager - [phone]  After 6pm go to [email or web address] - password [email or web address] [facility]  Office  [phone]  CC: Primary care physician; Dion Body, MD

## 2016-03-23 DIAGNOSIS — J9601 Acute respiratory failure with hypoxia: Secondary | ICD-10-CM | POA: Diagnosis not present

## 2016-03-23 DIAGNOSIS — J189 Pneumonia, unspecified organism: Secondary | ICD-10-CM | POA: Diagnosis not present

## 2016-03-23 DIAGNOSIS — J441 Chronic obstructive pulmonary disease with (acute) exacerbation: Secondary | ICD-10-CM | POA: Diagnosis not present

## 2016-03-23 DIAGNOSIS — J9611 Chronic respiratory failure with hypoxia: Secondary | ICD-10-CM | POA: Diagnosis not present

## 2016-03-23 DIAGNOSIS — R0600 Dyspnea, unspecified: Secondary | ICD-10-CM | POA: Diagnosis not present

## 2016-03-23 DIAGNOSIS — R748 Abnormal levels of other serum enzymes: Secondary | ICD-10-CM | POA: Diagnosis not present

## 2016-03-23 DIAGNOSIS — R0609 Other forms of dyspnea: Secondary | ICD-10-CM | POA: Diagnosis not present

## 2016-03-23 LAB — CULTURE, BLOOD (ROUTINE X 2)
Culture: NO GROWTH
Culture: NO GROWTH

## 2016-03-23 LAB — GLUCOSE, CAPILLARY: Glucose-Capillary: 81 mg/dL (ref 65–99)

## 2016-03-23 LAB — CREATININE, SERUM
Creatinine, Ser: 0.78 mg/dL (ref 0.61–1.24)
GFR calc Af Amer: >60 mL/min (ref >60)
GFR calc non Af Amer: >60 mL/min (ref >60)

## 2016-03-23 MED ORDER — AZITHROMYCIN 250 MG PO TABS: ORAL_TABLET | ORAL | Status: DC

## 2016-03-23 MED ORDER — AMOXICILLIN-POT CLAVULANATE 875-125 MG PO TABS: 1.0000 | ORAL_TABLET | Freq: Two times a day (BID) | ORAL | Status: DC

## 2016-03-23 MED ORDER — ENSURE ENLIVE PO LIQD: 237.0000 mL | Freq: Two times a day (BID) | ORAL | Status: DC

## 2016-03-23 MED ORDER — PREDNISONE 10 MG (21) PO TBPK: 10.0000 mg | ORAL_TABLET | Freq: Every day | ORAL | Status: DC

## 2016-03-23 MED ORDER — IPRATROPIUM-ALBUTEROL 0.5-2.5 (3) MG/3ML IN SOLN: 3.0000 mL | Freq: Four times a day (QID) | RESPIRATORY_TRACT | Status: AC

## 2016-03-23 NOTE — Progress Notes (Signed)
Pt to be discharged today. Iv's d/c'd. (no tele). disch instructions and prescrips given to pt to his understanding. disch via w.c. On 2 litres 02 n.c. Accompanied by friend.

## 2016-03-23 NOTE — Care Management Note (Signed)
Case Management Note  Patient Details  Name: Devin Martinez MRN: [id number] Date of Birth: 1941-03-14  Subjective/Objective:          Portable oxygen tank already at bedside, was delivered Friday by [facility]. A referral was faxed today to [facility] requesting HH-PT and RN services.         Action/Plan:   Expected Discharge Date:                  Expected Discharge Plan:     In-House Referral:     Discharge planning Services     Post Acute Care Choice:    Choice offered to:     DME Arranged:    DME Agency:     HH Arranged:    [place] Agency:     Status of Service:     Medicare Important Message Given:  Yes Date Medicare IM Given:    Medicare IM give by:    Date Additional Medicare IM Given:    Additional Medicare Important Message give by:     If discussed at [place] of Stay Meetings, dates discussed:    Additional Comments:  Chenise Mulvihill A, RN 03/23/2016, 9:27 AM

## 2016-03-23 NOTE — Discharge Summary (Signed)
Devin Martinez, is a 75 y.o. male  DOB 1940-11-12  MRN [id number].  Admission date:  03/16/2016  Admitting Physician  Nicholes Mango, MD  Discharge Date:  03/23/2016   Primary MD  Dion Body, MD  Recommendations for primary care physician for things to follow:   Follow-up with primary doctor in 1 week   Admission Diagnosis  Community acquired pneumonia [J18.9] Hypoxia [R09.02]   Discharge Diagnosis  Community acquired pneumonia [J18.9] Hypoxia [R09.02]    Active Problems:   Acute respiratory distress [facility])      Past Medical History  Diagnosis Date  . Obesity   . LVH (left ventricular hypertrophy)     Hx. of  . Hyperlipidemia   . Herpes   . Hypertension   . COPD (chronic obstructive pulmonary disease) ([place])   . Degenerative arthritis of left knee   . Smoker   . Diabetes mellitus     Type II  . Asthma     Past Surgical History  Procedure Laterality Date  . Laminectomy         History of present illness and  Hospital Course:     Kindly see H&P for history of present illness and admission details, please review complete Labs, Consult reports and Test reports for all details in brief  HPI  from the history and physical done on the day of admission  75 year old male patient noted on May 21 for shortness of breath, cough. Patient has history of hypertension hypertension, COPD, hyperlipidemia came in because of shortness of breath, productive cough profile 4-5 days. A chest x-ray on admission showed bilateral lower lobe infiltrates, diffuse wheezing and tachycardia. Admitted to ICU for acute respiratory failure due to COPD exacerbation and bilateral pneumonia  Hospital Course  #1 acute respiratory failure secondary to COPD exacerbation and bilateral pneumonia: Admitted to CCU stepdown, started on BiPAP,  received Solu-Medrol, nebulizers. Patient is seen by pulmonary physician. Started on icing, Zosyn. Patient oxygen saturation on admission low 80s on room air had tachycardia and tachypnea. Patient initially started on BiPAP, he had the BiPAP mask only for 2 hours after that he refused to wear it so changed back to nasal cannula 4 L. Because patient had increased work of breathing. Started the BiPAP. Patient respirations improved, now him to telemetry. Continued on nebulizers, antibiotics, steroids blood cultures showed staphylococci with the contamination. A shave had anxiety and claustrophobia BiPAP mask. Patient received Xanax for anxiety. He feels much better, using the flutter all. Patient feels much better today stable for discharge with tapering course of prednisone for 21 days, 10 day course of antibiotics. Needs pulmonary as an outpatient, that can be arranged through primary doctor. I'll schedule him to continue nebulizers that he has at home with albuterol and Atrovent. Patient qualified for home oxygen, O2 sats on room air at rest 87%, on 2 L 92%. So arranged home health physical therapy and oxygen.  #2 community-acquired pneumonia: Received vancomycin, Zosyn discharging him home with Augmentin, erythromycin. Sputum cultures showed gram positve coci and gram negative rods. #3 diabetes mellitus type 2 without complication: #4 essential hypertension: Controlled, continue enalapril, HCTZ. #5 elevated troponin secondary to demand ischemia. On insert marginally elevated to 0.04. Echocardiogram showed EF of 65%.  6. isolation ;had history of MRSA so continued contact isolation here. No active infection this time.  Discharge Condition:    Follow UP  Follow-up Information    Follow up with [facility].   Why:  They  will call and schedule an appoitment to come out.    Contact information:   [place] [address] [phone]         Discharge  Instructions  and  Discharge Medications        Medication List    STOP taking these medications        hydrochlorothiazide 25 MG tablet  Commonly known as:  HYDRODIURIL      TAKE these medications        amoxicillin-clavulanate 875-125 MG tablet  Commonly known as:  AUGMENTIN  Take 1 tablet by mouth every 12 (twelve) hours.     aspirin 81 MG tablet  Take 81 mg by mouth daily.     azithromycin 250 MG tablet  Commonly known as:  ZITHROMAX  Daily for 6 days     enalapril-hydrochlorothiazide 10-25 MG tablet  Commonly known as:  VASERETIC  Take 1 tablet by mouth every other day.     feeding supplement (ENSURE ENLIVE) Liqd  Take 237 mLs by mouth 2 (two) times daily between meals.     ipratropium-albuterol 0.5-2.5 (3) MG/3ML Soln  Commonly known as:  DUONEB  Take 3 mLs by nebulization every 6 (six) hours.     multivitamin tablet  Take 1 tablet by mouth daily.     OVER THE COUNTER MEDICATION  Take 1 tablet by mouth daily as needed. OTC heartburn     predniSONE 10 MG (21) Tbpk tablet  Commonly known as:  STERAPRED UNI-PAK 21 TAB  Take 1 tablet (10 mg total) by mouth daily. Daily for 21 days Taper accordingly     RED YEAST RICE PO  Take 1 tablet by mouth daily.          Diet and Activity recommendation: See Discharge Instructions above   Consults obtained - Pulmonary , physical therapy, social worker   Major procedures and Radiology Reports - PLEASE review detailed and final reports for all details, in brief -      Dg Chest 2 View  03/21/2016  CLINICAL DATA:  Pt has respiratory failure. Hx of hypertension, COPD, diabetes, asthma. Current some day smoker. EXAM: CHEST  2 VIEW COMPARISON:  03/17/2016 FINDINGS: Heart size is normal. Prominent interstitial markings are again noted throughout the lungs and appear stable. No focal consolidations. Minimal right basilar pleural change noted. IMPRESSION: Minimal right lower lobe pleural thickening or pleural fluid.  Stable prominence of interstitial markings. Electronically Signed   By: Nolon Nations M.D.   On: 03/21/2016 09:14   Dg Chest 2 View  03/16/2016  CLINICAL DATA:  Shortness of breath EXAM: CHEST  2 VIEW COMPARISON:  01/30/2015 chest radiograph. FINDINGS: Stable cardiomediastinal silhouette with normal heart size. No pneumothorax. No pleural effusion. Hyperinflated lungs. New hazy opacity in the bilateral lower lobes. No pulmonary edema. IMPRESSION: 1. New hazy opacity in the bilateral lower lobes, suggestive of infectious bronchiolitis/bronchopneumonia. 2. Hyperinflated lungs, suggesting COPD. Electronically Signed   By: Ilona Sorrel M.D.   On: 03/16/2016 14:46   Dg Chest Port 1 View  03/17/2016  CLINICAL DATA:  Acute respiratory failure. EXAM: PORTABLE CHEST 1 VIEW COMPARISON:  03/16/2016. FINDINGS: Low lung volumes accentuate the heart size which is probably stable. Improved interstitial prominence bilaterally, with faint bibasilar opacities, likely representing partial clearing of edema or infiltrates. CP angles are excluded from the film but there is no large effusion. COPD. IMPRESSION: Slight improvement aeration. Electronically Signed   By: Staci Righter M.D.   On:  03/17/2016 07:33    Micro Results     Recent Results (from the past 240 hour(s))  Culture, blood (routine x 2)     Status: Abnormal   Collection Time: 03/16/16  3:38 PM  Result Value Ref Range Status   Specimen Description BLOOD LEFT ASSIST CONTROL  Final   Special Requests BOTTLES DRAWN AEROBIC AND ANAEROBIC 8CC  Final   Culture  Setup Time   Final    GRAM POSITIVE COCCI ANAEROBIC BOTTLE ONLY CRITICAL RESULT CALLED TO, READ BACK BY AND VERIFIED WITH: HANK ZOMPA AT 0800 ON 03/18/16. CTJ    Culture (A)  Final    STAPHYLOCOCCUS SPECIES ANAEROBIC BOTTLE ONLY Results consistent with contamination.    Report Status 03/22/2016 FINAL  Final  Blood Culture ID Panel (Reflexed)     Status: Abnormal   Collection Time: 03/16/16   3:38 PM  Result Value Ref Range Status   Enterococcus species NOT DETECTED NOT DETECTED Final   Vancomycin resistance NOT DETECTED NOT DETECTED Final   Listeria monocytogenes NOT DETECTED NOT DETECTED Final   Staphylococcus species DETECTED (A) NOT DETECTED Final    Comment: CRITICAL RESULT CALLED TO, READ BACK BY AND VERIFIED WITH: HANK ZOMPA AT 0800 ON 03/18/16. CTJ    Staphylococcus aureus NOT DETECTED NOT DETECTED Final   Methicillin resistance NOT DETECTED NOT DETECTED Final   Streptococcus species NOT DETECTED NOT DETECTED Final   Streptococcus agalactiae NOT DETECTED NOT DETECTED Final   Streptococcus pneumoniae NOT DETECTED NOT DETECTED Final   Streptococcus pyogenes NOT DETECTED NOT DETECTED Final   Acinetobacter baumannii NOT DETECTED NOT DETECTED Final   Enterobacteriaceae species NOT DETECTED NOT DETECTED Final   Enterobacter cloacae complex NOT DETECTED NOT DETECTED Final   Escherichia coli NOT DETECTED NOT DETECTED Final   Klebsiella oxytoca NOT DETECTED NOT DETECTED Final   Klebsiella pneumoniae NOT DETECTED NOT DETECTED Final   Proteus species NOT DETECTED NOT DETECTED Final   Serratia marcescens NOT DETECTED NOT DETECTED Final   Carbapenem resistance NOT DETECTED NOT DETECTED Final   Haemophilus influenzae NOT DETECTED NOT DETECTED Final   Neisseria meningitidis NOT DETECTED NOT DETECTED Final   Pseudomonas aeruginosa NOT DETECTED NOT DETECTED Final   Candida albicans NOT DETECTED NOT DETECTED Final   Candida glabrata NOT DETECTED NOT DETECTED Final   Candida krusei NOT DETECTED NOT DETECTED Final   Candida parapsilosis NOT DETECTED NOT DETECTED Final   Candida tropicalis NOT DETECTED NOT DETECTED Final  Culture, blood (routine x 2)     Status: None   Collection Time: 03/16/16  3:39 PM  Result Value Ref Range Status   Specimen Description BLOOD RIGHT ASSIST CONTROL  Final   Special Requests BOTTLES DRAWN AEROBIC AND ANAEROBIC 8CC  Final   Culture NO GROWTH 5 DAYS   Final   Report Status 03/21/2016 FINAL  Final  MRSA PCR Screening     Status: Abnormal   Collection Time: 03/16/16  5:00 PM  Result Value Ref Range Status   MRSA by PCR POSITIVE (A) NEGATIVE Final    Comment:        The GeneXpert MRSA Assay (FDA approved for NASAL specimens only), is one component of a comprehensive MRSA colonization surveillance program. It is not intended to diagnose MRSA infection nor to guide or monitor treatment for MRSA infections. CRITICAL RESULT CALLED TO, READ BACK BY AND VERIFIED WITH: CHRISTINA MILES @ [id number] 03/16/16 BY TCH   Culture, sputum-assessment     Status: None   Collection Time:  03/16/16  5:04 PM  Result Value Ref Range Status   Specimen Description EXPECTORATED SPUTUM  Final   Special Requests NONE  Final   Sputum evaluation THIS SPECIMEN IS ACCEPTABLE FOR SPUTUM CULTURE  Final   Report Status 03/16/2016 FINAL  Final  Culture, respiratory (NON-Expectorated)     Status: None   Collection Time: 03/16/16  5:04 PM  Result Value Ref Range Status   Specimen Description EXPECTORATED SPUTUM  Final   Special Requests NONE Reflexed from [id number]  Final   Gram Stain   Final    FEW GRAM POSITIVE COCCI RARE GRAM VARIABLE ROD RARE GRAM NEGATIVE RODS    Culture Consistent with normal respiratory flora.  Final   Report Status 03/19/2016 FINAL  Final  Culture, blood (Routine X 2) w Reflex to ID Panel     Status: None (Preliminary result)   Collection Time: 03/18/16 12:30 PM  Result Value Ref Range Status   Specimen Description BLOOD RIGHT THUMB  Final   Special Requests   Final    BOTTLES DRAWN AEROBIC AND ANAEROBIC AER 2ML ANA .5ML   Culture NO GROWTH 4 DAYS  Final   Report Status PENDING  Incomplete  Culture, blood (Routine X 2) w Reflex to ID Panel     Status: None (Preliminary result)   Collection Time: 03/18/16  1:18 PM  Result Value Ref Range Status   Specimen Description BLOOD LEFT UPPER ARM  Final   Special Requests   Final    BOTTLES DRAWN  AEROBIC AND ANAEROBIC  AER 7ML ANA 3ML   Culture NO GROWTH 4 DAYS  Final   Report Status PENDING  Incomplete       Today   Subjective:   Devin Martinez today has no headache,no chest abdominal pain,no new weakness tingling or numbness, feels much better wants to go home today.   Objective:   Blood pressure 138/53, pulse 79, temperature 98.2 F (36.8 C), temperature source Oral, resp. rate 16, height 5\' 11"  (1.803 m), weight 86.6 kg (190 lb 14.7 oz), SpO2 96 %.  No intake or output data in the 24 hours ending 03/23/16 0834  Exam Awake Alert, Oriented x 3, No new F.N deficits, Normal affect No Name.AT,PERRAL Supple Neck,No JVD, No cervical lymphadenopathy appriciated.  Symmetrical Chest wall movement, Good air movement bilaterally, CTAB RRR,No Gallops,Rubs or new Murmurs, No Parasternal Heave +ve B.Sounds, Abd Soft, Non tender, No organomegaly appriciated, No rebound -guarding or rigidity. No Cyanosis, Clubbing or edema, No new Rash or bruise  Data Review   CBC w Diff:  Lab Results  Component Value Date   WBC 12.4* 03/20/2016   HGB 13.2 03/20/2016   HCT 40.3 03/20/2016   PLT 292 03/20/2016   LYMPHOPCT 2% 03/16/2016   MONOPCT 3% 03/16/2016   EOSPCT 0% 03/16/2016   BASOPCT 0% 03/16/2016    CMP:  Lab Results  Component Value Date   NA 136 03/18/2016   K 4.4 03/18/2016   CL 99* 03/18/2016   CO2 30 03/18/2016   BUN 47* 03/18/2016   CREATININE 0.78 03/23/2016   PROT 6.8 01/30/2015   ALBUMIN 3.9 01/30/2015   BILITOT 0.6 01/30/2015   ALKPHOS 89 01/30/2015   AST 27 01/30/2015   ALT 20 01/30/2015  .   Total Time in preparing paper work, data evaluation and todays exam - 69 minutes  Gared Gillie M.D on 03/23/2016 at 8:34 AM    Note: This dictation was prepared with Dragon dictation along with smaller phrase technology. Any  transcriptional errors that result from this process are unintentional.

## 2016-03-23 NOTE — Progress Notes (Signed)
* [facility]     Assessment and Plan:  75 year old male with the COPD, now presents with COPD exacerbation and pneumonia.  PULMONARY A: Acute hypoxic respiratory failure-improving now on 3 L. Acute COPD exacerbation Community-acquired pneumonia P:   -cont with PO abx - can give a total of 10 days.  -Nebulized bronchodilators -cxr prn -ABG when necessary -Smoking cessation advice given -IV steroids, transition to PO steroids (can start at 40mg ) and taper over 2 weeks - 6 minute walk test to determine O2 needs - on 3L with exertion - may require oxygen upon discharge  - does NOT require Bipap/Cpap as an outpateint - physical therapy - overall with good clinical improvement, mild wheezing noted, but significantly improved.  - [facility] will call patient with follow up appointment.   Date: 03/23/2016  MRN# [id number] Devin Martinez Jul 17, 1941   Devin Martinez is a 75 y.o. old male seen in follow up for chief complaint of  Chief Complaint  Patient presents with  . Shortness of Breath  . Cough     HPI:   Patient feeling better today than on previous days. No new complaints.  Still with cough but much improved, being discharged today.     Allergies:  Review of patient's allergies indicates no known allergies.  Review of Systems: Gen:  Denies  fever, sweats. HEENT: Denies blurred vision. Cvc:  No dizziness, chest pain or heaviness Resp:   Denies cough or sputum porduction. Gi: Denies swallowing difficulty, stomach pain. constipation, bowel incontinence Gu:  Denies bladder incontinence, burning urine Ext:   No Joint pain, stiffness. Skin: No skin rash, easy bruising. Endoc:  No polyuria, polydipsia. Psych: No depression, insomnia. Other:  All other systems were reviewed and found to be negative other than what is mentioned in the HPI.   Physical Examination:   VS: BP 138/53 mmHg  Pulse 79  Temp(Src) 98.2 F (36.8 C) (Oral)  Resp 16   Ht 5\' 11"  (1.803 m)  Wt 190 lb 14.7 oz (86.6 kg)  BMI 26.64 kg/m2  SpO2 96%  General Appearance: No distress  Neuro:without focal findings,  speech normal,  HEENT: PERRLA, EOM intact. Pulmonary: good respiratory effort, now with faint expiratory wheezes (over the left lung field - this is an improvement).  CardiovascularNormal S1,S2.  No m/r/g.   Abdomen: Benign, Soft, non-tender. Renal:  No costovertebral tenderness  GU:  Not performed at this time. Endoc: No evident thyromegaly, no signs of acromegaly. Skin:   warm, no rash. Extremities: normal, no cyanosis, clubbing.   LABORATORY PANEL:   CBC  Recent Labs Lab 03/20/16 0548  WBC 12.4*  HGB 13.2  HCT 40.3  PLT 292   ------------------------------------------------------------------------------------------------------------------  Chemistries   Recent Labs Lab 03/18/16 0609  03/23/16 0547  NA 136  --   --   K 4.4  --   --   CL 99*  --   --   CO2 30  --   --   GLUCOSE 168*  --   --   BUN 47*  --   --   CREATININE 0.99  < > 0.78  CALCIUM 8.5*  --   --   < > = values in this interval not displayed. ------------------------------------------------------------------------------------------------------------------  Cardiac Enzymes  Recent Labs Lab 03/17/16 0525  TROPONINI 0.04*   ------------------------------------------------------------  RADIOLOGY:   No results found for this or any previous visit. Results for orders placed during the hospital encounter of 03/16/16  DG Chest  2 View   Narrative CLINICAL DATA:  Shortness of breath  EXAM: CHEST  2 VIEW  COMPARISON:  01/30/2015 chest radiograph.  FINDINGS: Stable cardiomediastinal silhouette with normal heart size. No pneumothorax. No pleural effusion. Hyperinflated lungs. New hazy opacity in the bilateral lower lobes. No pulmonary edema.  IMPRESSION: 1. New hazy opacity in the bilateral lower lobes, suggestive of infectious  bronchiolitis/bronchopneumonia. 2. Hyperinflated lungs, suggesting COPD.   Electronically Signed   By: Ilona Sorrel M.D.   On: 03/16/2016 14:46    ------------------------------------------------------------------------------------------------------------------  Thank  you for allowing [facility] to assist in the care of your patient. Our recommendations are noted above.  Please contact us if we can be of further service.  Pulmonary Consult time - 30 mins   Vilinda Boehringer, MD [facility] Pager [phone] (please enter 7-digits) On Call Pager - [phone] (please enter 7-digits) Clinic - [phone]   03/23/2016

## 2016-03-25 NOTE — Progress Notes (Signed)
[facility]  Patient Status: patient declined [facility] services due to copays    If patient discharges after hours, please call [phone].   Devin Martinez 03/25/2016, 10:55 AM

## 2016-03-31 DIAGNOSIS — R0600 Dyspnea, unspecified: Secondary | ICD-10-CM | POA: Diagnosis not present

## 2016-03-31 DIAGNOSIS — J441 Chronic obstructive pulmonary disease with (acute) exacerbation: Secondary | ICD-10-CM | POA: Diagnosis not present

## 2016-04-01 ENCOUNTER — Ambulatory Visit (INDEPENDENT_AMBULATORY_CARE_PROVIDER_SITE_OTHER): Payer: PPO | Admitting: Pulmonary Disease

## 2016-04-01 ENCOUNTER — Encounter: Payer: Self-pay | Admitting: Pulmonary Disease

## 2016-04-01 VITALS — BP 140/60 | HR 90 | Ht 71.0 in | Wt 187.0 lb

## 2016-04-01 DIAGNOSIS — Z8701 Personal history of pneumonia (recurrent): Secondary | ICD-10-CM

## 2016-04-01 DIAGNOSIS — Z77090 Contact with and (suspected) exposure to asbestos: Secondary | ICD-10-CM

## 2016-04-01 DIAGNOSIS — R0902 Hypoxemia: Secondary | ICD-10-CM | POA: Diagnosis not present

## 2016-04-01 DIAGNOSIS — J449 Chronic obstructive pulmonary disease, unspecified: Secondary | ICD-10-CM

## 2016-04-01 NOTE — Progress Notes (Signed)
PULMONARY POST HOSPITAL FOLLOW UP  PROBLEMS: Recent hospitalization (05/21-05/28/17) for PNA, AECOPD  Discharged home on oxygen History of asbestos exposure - worked in [employer] Longstanding COPD - maintained on Symbicort and nebulized duonebs PRN  INTERVAL HISTORY: No major events since discharge  SUBJ: Dyspnea and exercise tolerance are gradually improving since discharge. He is approx 80% back to his baseline class II dyspnea. Still with rattling cough. Using duoneb 3-4 times per day. He was discharged home on O2 which he is not using at all as he notes no symptomatic improvement when wearing it  OBJ: Filed Vitals:   04/01/16 1013  BP: 140/60  Pulse: 90  Height: 5\' 11"  (1.803 m)  Weight: 187 lb (84.823 kg)  SpO2: 93%   Pleasant, NAD, occasional rattling cough HEENT WNL No JVD Mildly decreased BS, bilateral coarse crackles, no wheezes RRR s M NABS, soft R>L 1-2+ ankle edema   DATA: CXR 03/21/16: Minimal right lower lobe pleural thickening or pleural fluid. Stable prominence of interstitial markings  IMPRESSION: 1) COPD - CXR c/w emphysema. Symptoms c/w chronic bronchitis. Severity likely moderate 2) Asbestos exposure 3) Hypoxemia noted during recent hospitalization. SpO2 93% @ rest today 4) recent PNA - resolving   PLAN: 1) Continue Symbicort and duonebs 2) check overnight oximetry 3) ROV 6-8 weeks with PFTs and repeat CXR  Merton Border, MD PCCM service Mobile [phone] Pager [phone] 04/01/2016

## 2016-04-13 DIAGNOSIS — J441 Chronic obstructive pulmonary disease with (acute) exacerbation: Secondary | ICD-10-CM | POA: Diagnosis not present

## 2016-04-14 ENCOUNTER — Other Ambulatory Visit: Payer: Self-pay | Admitting: *Deleted

## 2016-04-14 NOTE — Patient Outreach (Signed)
[facility]) Care Management  04/14/2016  SUN AFIFI April 13, 1941 [id number]   Subjective: Telephone call to patient's home number, spoke with patient, and HIPAA verified.   Patient requested call back at a later time.  Objective: Per chart review: Patient hospitalized 03/16/16 - 03/23/16 for Community acquired pneumonia.   Patient has a history of hypertension, COPD, and diabetes.     Assessment:  Received [facility] Referral on 04/09/16.    Referral source: [employer].    Referral reason: Past medical history : obesity, hypertension, hyperlipidemia, COPD, degenerative arthritis of left knee with knee replacement, smoker, diabetes, asthma, laminectomy, herpes, and MRSA.   He is using oxygen inconsistently, recently stopped smoking, and declined home health services due to copays.    Telephone screen pending, patient contact.  Plan: RNCM will call patient for 2nd attempt, telephone screening within 2 weeks.   Debie Ashline H. Annia Friendly, BSN, [place] [facility] Telephonic CM Phone: [phone] Fax: [phone]

## 2016-04-15 ENCOUNTER — Other Ambulatory Visit: Payer: Self-pay | Admitting: *Deleted

## 2016-04-15 NOTE — Patient Outreach (Signed)
[facility]) Care Management  04/15/2016  DEREION JARQUIN [birth year] [id number]  Subjective: Telephone call to patient's home number, line busy, and unable to leave a message.  Telephone call to patient's home number, no answer, no answering machine / voicemail, and unable to leave a message.  Telephone call to patient's mobile number, no answer, left HIPAA compliant voicemail message, and requested call back.   Objective: Per chart review: Patient hospitalized 03/16/16 - 03/23/16 for Community acquired pneumonia. Patient has a history of hypertension, COPD, and diabetes.   Assessment: Received [employer] Referral on 04/09/16. Referral source: [employer]. Referral reason: Past medical history : obesity, hypertension, hyperlipidemia, COPD, degenerative arthritis of left knee with knee replacement, smoker, diabetes, asthma, laminectomy, herpes, and MRSA. He is using oxygen inconsistently, recently stopped smoking, and declined home health services due to copays. Telephone screen pending, patient contact.  Plan: RNCM will call patient for 3rd attempt, telephone screening within 2 weeks.   Aveah Castell H. Annia Friendly, BSN, [place] Management [facility] Telephonic CM Phone: [phone] Fax: [phone]

## 2016-04-16 ENCOUNTER — Other Ambulatory Visit: Payer: Self-pay | Admitting: *Deleted

## 2016-04-16 DIAGNOSIS — J441 Chronic obstructive pulmonary disease with (acute) exacerbation: Secondary | ICD-10-CM

## 2016-04-16 NOTE — Patient Outreach (Signed)
[facility]) Care Management  04/16/2016  Devin Martinez August 05, 1941 [id number]   Subjective: Telephone call to patient's home number, spoke with patient, and HIPAA verified.   Patient states he is doing well and improving everyday since hospitalization.   Patient gave [facility] verbal authorization to speak with daughter Devin Martinez) and sister Devin Martinez) regarding his healthcare needs as needed.   Discussed [facility] Care Management services and patient in agreement to services. Patient states his biggest educational need is with COPD.    States he is inconsistently wearing oxygen, MD recently completed an oxygen study, and he is waiting for the results.   States he does not have diabetes and has a history of hypertension.  States he does not walk with any assistive devices.     Patient states he the following equipment: blood pressure cuff, oxygen, and standard walker.   Patient states he does not have any medication or transportation needs. Patient states he follows up with providers as needed.  States he has a breathing test at pulmonologist office on 05/16/16.   States he has an appointment with pulmonologist (Dr. Alva Garnet) on 05/20/16. States he has an appointment at the [facility] on 06/06/16.   Patient in agreement to referral to [place] for transition of care follow up, COPD disease monitoring, COPD education, and oxygen monitoring.   Patient will continue to receive [place] Management services.   Objective: Per chart review: Patient hospitalized 03/16/16 - 03/23/16 for Community acquired pneumonia. Patient has a history of hypertension, COPD, and diabetes.   Assessment: Received [facility] Referral on 04/09/16. Referral source: [employer]. Referral reason: Past medical history : obesity, hypertension, hyperlipidemia, COPD, degenerative arthritis of left knee with knee replacement, smoker, diabetes, asthma, laminectomy,  herpes, and MRSA. He is using oxygen inconsistently, recently stopped smoking, and declined home health services due to copays. Telephone screen completed.   Patient will continue to receive [place] Management services.   Patient has no Telephonic RNCM needs at this time.    Plan: RNCM will refer patient to [facility] for transition of care follow up, COPD disease monitoring, COPD education, and oxygen monitoring.   Christa Fasig H. Annia Friendly, BSN, [place] [facility] Telephonic CM Phone: [phone] Fax: [phone]

## 2016-04-23 ENCOUNTER — Other Ambulatory Visit: Payer: Self-pay | Admitting: *Deleted

## 2016-04-23 DIAGNOSIS — J441 Chronic obstructive pulmonary disease with (acute) exacerbation: Secondary | ICD-10-CM | POA: Diagnosis not present

## 2016-04-23 NOTE — Patient Outreach (Signed)
Attempt made to contact pt, f/u on referral from [facility] telephonic RN CM (transition of care- discharged 5/28, CAP, COPD disease monitoring/education).   HIPPA compliant voice message left with contact number, if no response will try again.    [name].   [place] Care Management  [phone]

## 2016-04-24 ENCOUNTER — Other Ambulatory Visit: Payer: Self-pay | Admitting: *Deleted

## 2016-04-24 NOTE — Patient Outreach (Signed)
Second f/u phone call successful- f/u on referral from [facility] telephonic RN CM (transition of care- discharged 5/28 CAP), CM start date 6/21.    Spoke with pt, HIPAA verified (date of birth, address).   Discussed with pt referral received, agreed to have RN CM do a home visit 6/30 - Transition of care follow up, provide COPD information.    Plan to f/u with pt 6/30- initial home visit.    [name].   [place] Care Management  [phone]

## 2016-04-25 ENCOUNTER — Other Ambulatory Visit: Payer: Self-pay | Admitting: *Deleted

## 2016-04-25 ENCOUNTER — Encounter: Payer: Self-pay | Admitting: *Deleted

## 2016-04-25 NOTE — Patient Outreach (Addendum)
Devin Martinez) Care Management   04/25/2016  Devin Martinez 1940-11-09 Devin [id number]  Devin Martinez is an 75 y.o. male  Subjective:  Pt reports breathing is better since last hospital discharge (admitted for Pneumonia 5/21-5/28).  Pt reports MD said to use oxygen  at night but when wakes up during the night, check oxygen- 95%.   Pt reports saw Devin Martinez yesterday, plus to have chest xray, breathing test done 7/21- Devin Martinez office.   Pt reports sob after returning from garden in back yard, checking mail, doing his incentive spirometer 1-3 times a day.    Objective:   Filed Vitals:   04/25/16 0956  BP: 130/50  Pulse: 66  Resp: 24    ROS  Physical Exam  Constitutional: He is oriented to person, place, and time. He appears well-developed and well-nourished.  Cardiovascular: Regular rhythm and normal heart sounds.   Respiratory: Effort normal. He has rales.  Crackles noted on inspiration in posterior bilateral lower lobes.   GI: Soft. Bowel sounds are normal.  Musculoskeletal: Normal range of motion. He exhibits no edema.  Neurological: He is alert and oriented to person, place, and time.  Skin: Skin is warm and dry.  Psychiatric: He has a normal mood and affect. His behavior is normal. Judgment and thought content normal.    Encounter Medications:   Outpatient Encounter Prescriptions as of 04/25/2016  Medication Sig Note  . budesonide-formoterol (SYMBICORT) 160-4.5 MCG/ACT inhaler Inhale 2 puffs into the lungs 2 (two) times daily.   . enalapril-hydrochlorothiazide (VASERETIC) 10-25 MG per tablet Take 1 tablet by mouth every other day.  04/25/2016: Pt taking every day.   . feeding supplement, ENSURE ENLIVE, (ENSURE ENLIVE) LIQD Take 237 mLs by mouth 2 (two) times daily between meals. 04/25/2016: Once  A day   . ipratropium-albuterol (DUONEB) 0.5-2.5 (3) MG/3ML SOLN Take 3 mLs by nebulization every 6 (six) hours.   . Red Yeast Rice Extract (RED YEAST RICE PO) Take 1  tablet by mouth daily.     Devin Martinez amoxicillin-clavulanate (AUGMENTIN) 875-125 MG tablet Take 1 tablet by mouth every 12 (twelve) hours. (Patient not taking: Reported on 04/25/2016)   . aspirin 81 MG tablet Take 81 mg by mouth daily. Reported on 04/25/2016   . Multiple Vitamin (MULTIVITAMIN) tablet Take 1 tablet by mouth daily. Reported on 04/25/2016   . OVER THE COUNTER MEDICATION Take 1 tablet by mouth daily as needed. Reported on 04/25/2016    No facility-administered encounter medications on file as of 04/25/2016.    Functional Status:   In your present state of health, do you have any difficulty performing the following activities: 03/16/2016  Hearing? N  Vision? N  Difficulty concentrating or making decisions? N  Walking or climbing stairs? N  Dressing or bathing? N  Doing errands, shopping? Y    Fall/Depression Screening:    PHQ 2/9 Scores 04/25/2016  PHQ - 2 Score 0    Assessment:  Pleasant 75 year old gentleman, lives alone in one story home.   No c/o pain.                         COPD-  Crackles noted on inspiration in posterior bilateral lower lobes, O2 saturation at rest RA                            95-98%.   Pt applied O2 on short time  during home visit.  Demonstrated to pt use of purse lip                                   breathing,pt demonstrated back.                                                    Plan:  Pt to  F/u for  chest xray, breathing test 7/21, Devin Martinez 7/25.             Pt to continue to be compliant with respiratory medications as ordered (nebulizer treatments, inhalers).              Plan to inform Devin Martinez of [facility] involvement- fax barrier letter.            Plan to continue to follow pt for transition of care, f/u again 7/7 telephonically.   THN CM Care Plan Problem One        Most Recent Value   Care Plan Problem One  Recent hospitalization for PNA, hx of COPD   Role Documenting the Problem One  Care Management Coordinator   Care Plan for Problem One   Active   THN Long Term Goal (31-90 days)  Pt would not readmit in the next 31 days    THN Long Term Goal Start Date  04/25/16   Interventions for Problem One Long Term Goal  Provided pt with Emmi informatiion on COPD, What he can do.     THN CM Short Term Goal #1 (0-30 days)  Pt would continue to use O2 at night as ordered for the next 30 days (to have chest xray and breathing test done 7/21)   THN CM Short Term Goal #1 Start Date  04/25/16   Interventions for Short Term Goal #1  Reinforced what MD told pt to use O2 at night - O2 drops at night, not aware.    THN CM Short Term Goal #2 (0-30 days)  Pt would use purse lip breathing when sob with exertion for the next 30 days    THN CM Short Term Goal #2 Start Date  04/25/16   Interventions for Short Term Goal #2  Demonstrated to pt purse lip breathing, pt returned demonstration      Devin Martinez.   [place] Care Management  [phone]

## 2016-05-02 ENCOUNTER — Other Ambulatory Visit: Payer: Self-pay | Admitting: *Deleted

## 2016-05-02 NOTE — Patient Outreach (Signed)
10:06 am - called pt as part of transition of care, requested a call back, ready to eat.   10:51 am- Transition of care call successful (week 2, most recent inpatient stay 5/21-5/28 community acquired pneumonia- f/u on  [facility] telephonic RN CM referral 6/22). Pt reports cannot stay outdoors long in warm weather.  Pt  reports wearing his Oxygen at night a little bit, cannot keep it in his nose, does not see where it is doing any good.   RN CM reviewed with pt again not aware oxygen dropping when sleeping, reinforced importance of compliance with wearing oxygen at night as ordered to which pt voiced understanding, RN CM suggested using tape to keep tubing on.  Pt reports he just checked his oxygen, HR after coming from outside, oxygen 89%, HR 126, put O2 2 LNC on, sitting.   RN CM discussed with pt do  purse lip breathing also, recheck oxygen saturation, HR to which pt did- result was O2 95%, HR 106.   Pt reports that what he will do, put oxygen on/rest when sob. RN CM reinforced use of purse lip breathing.  Pt reports he is compliant with nebulizer treatments.   RN CM discussed with pt f/u again 7/14 telephonically as part of ongoing transition of care.      [name].   [place] Care Management  [phone]

## 2016-05-09 ENCOUNTER — Other Ambulatory Visit: Payer: Self-pay | Admitting: *Deleted

## 2016-05-09 NOTE — Patient Outreach (Signed)
Transition of care call successful (week 3, follow up on referral from [facility] telephonic RN CM for transition of care, COPD monitoring, education).  Last in patient stay 5/21-5/28 for community acquired pneumonia).  Spoke with pt, HIPAA verified.  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   Pt reports it is up to him how many days/hours he wants to work.   Pt reports not using his oxygen at night, cannot keep it in his nose to which RN CM suggested calling oxygen company, request mask.  Pt reports he does not want a mask, will put the oxygen on during the day if needed (when coming in after working outdoors).  Pt reports he has days when he comes home, checks his  Oxygen saturation, result is 93%.   Pt reports he has been doing the purse lip breathing when sob,still has congestion- when he coughs secretions clear, has Muccinex wanted to know okay to take (help bring up secretions) to which RN CM voiced would help.   Pt reports staying hydrated, stated earlier doing nebulizer treatments.     RN CM discussed with pt to let Lung MD (Dr. Alva Garnet) know not using oxygen at night (per pt to have breathing test, chest xray done 7/21, f/u with MD 7/25).      As discussed with pt, plan to follow up again telephonically 7/20- part of ongoing transition of care.     [name].   [place] Care Management  [phone]

## 2016-05-15 ENCOUNTER — Other Ambulatory Visit: Payer: Self-pay | Admitting: *Deleted

## 2016-05-15 NOTE — Patient Outreach (Signed)
Transition of care call successful (follow up on recent hospitalization 5/21-5/28 community acquired pneumonia).  Spoke with pt, HIPAA verified.  Pt reports doing fair as long as he does not overexert himself.  Pt reports he still has a lot of congestion,been going on for 3 weeks, secretions white/yellow tint.   Pt reports having difficulty getting secretions up, took Muccinex once yesterday, doing nebulizer treatments 3-4 times a day, both not really helping. RN CM discussed with pt several  signs/symptoms of pneumonia- fever, increased sob, change in mucous color to which pt reports no fever, no change in sob, did not notice a change in mucous color even though RN CM reminded pt on phone call last week reported secretions clear.   Pt  reports scheduled  to have a chest xray and breathing test tomorrow, then follow up with lung MD 7/26,will wait in out until see MD.  RN CM discussed importance of early detection, if develop symptoms  to call MD to which pt agreed.    As discussed with pt, plan to follow up again telephonically next week as part of ongoing transition of care.    [name].   [place] Care Management  [phone]

## 2016-05-16 ENCOUNTER — Ambulatory Visit (INDEPENDENT_AMBULATORY_CARE_PROVIDER_SITE_OTHER): Payer: PPO | Admitting: *Deleted

## 2016-05-16 ENCOUNTER — Ambulatory Visit
Admission: RE | Admit: 2016-05-16 | Discharge: 2016-05-16 | Disposition: A | Discharge status: Home / Self Care | Payer: PPO | Source: Ambulatory Visit | Attending: Pulmonary Disease | Admitting: Pulmonary Disease

## 2016-05-16 DIAGNOSIS — J449 Chronic obstructive pulmonary disease, unspecified: Secondary | ICD-10-CM | POA: Diagnosis not present

## 2016-05-16 DIAGNOSIS — R05 Cough: Secondary | ICD-10-CM | POA: Diagnosis not present

## 2016-05-16 DIAGNOSIS — I700 Atherosclerosis of aorta: Secondary | ICD-10-CM | POA: Insufficient documentation

## 2016-05-16 LAB — PULMONARY FUNCTION TEST
DL/VA % pred: 75 %
DL/VA: 3.49 ml/min/mmHg/L
DLCO unc % pred: 55 %
DLCO unc: 18.59 ml/min/mmHg
FEF 25-75 Post: 0.53 L/sec
FEF 25-75 Pre: 0.48 L/sec
FEF2575-%Change-Post: 8 %
FEF2575-%Pred-Post: 23 %
FEF2575-%Pred-Pre: 21 %
FEV1-%Change-Post: 6 %
FEV1-%Pred-Post: 36 %
FEV1-%Pred-Pre: 33 %
FEV1-Post: 1.13 L
FEV1-Pre: 1.06 L
FEV1FVC-%Change-Post: 3 %
FEV1FVC-%Pred-Pre: 65 %
FEV6-%Change-Post: 1 %
FEV6-%Pred-Post: 56 %
FEV6-%Pred-Pre: 55 %
FEV6-Post: 2.27 L
FEV6-Pre: 2.23 L
FEV6FVC-%Change-Post: 0 %
FEV6FVC-%Pred-Post: 104 %
FEV6FVC-%Pred-Pre: 105 %
FVC-%Change-Post: 2 %
FVC-%Pred-Post: 53 %
FVC-%Pred-Pre: 52 %
FVC-Post: 2.31 L
FVC-Pre: 2.25 L
Post FEV1/FVC ratio: 49 %
Post FEV6/FVC ratio: 98 %
Pre FEV1/FVC ratio: 47 %
Pre FEV6/FVC Ratio: 99 %

## 2016-05-16 NOTE — Progress Notes (Signed)
PFT performed today with Nitrogen washout. 

## 2016-05-21 ENCOUNTER — Ambulatory Visit (INDEPENDENT_AMBULATORY_CARE_PROVIDER_SITE_OTHER): Payer: PPO | Admitting: Pulmonary Disease

## 2016-05-21 ENCOUNTER — Encounter: Payer: Self-pay | Admitting: Pulmonary Disease

## 2016-05-21 VITALS — BP 124/60 | HR 67 | Ht 72.0 in | Wt 196.0 lb

## 2016-05-21 DIAGNOSIS — J449 Chronic obstructive pulmonary disease, unspecified: Secondary | ICD-10-CM | POA: Diagnosis not present

## 2016-05-21 DIAGNOSIS — Z87891 Personal history of nicotine dependence: Secondary | ICD-10-CM | POA: Diagnosis not present

## 2016-05-21 NOTE — Patient Instructions (Signed)
Continue Symbicort inhaler - 2 puffs twice a day Continue Duoneb treatments as needed Continue oxygen therapy - especially with sleep Follow up in 4-6 months

## 2016-05-21 NOTE — Progress Notes (Signed)
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 Using Duoneb 2-3 times per day. Using oxygen intermittently with exertion. Using Symbicort compliantly. Denies CP, fever, purulent sputum, hemoptysis, LE edema and calf tenderness. Here to review PFTs and CXR   OBJ: Vitals:   05/21/16 1015  BP: 124/60  Pulse: 67  SpO2: 93%  Weight: 196 lb (88.9 kg)  Height: 6' (1.829 m)   Pleasant, NAD HEENT WNL No JVD Mildly decreased BS, faint bibasilr crackles, no wheezes RRR s M NABS, soft R>L 1+ ankle edema   DATA: CXR 05/16/16: bilateral interstitial prominence improved. 2 RLL nodules that have appearance of benign granulomatous process  PFTs 05/16/16: Severe obstruction, mild-mod restriction, moderate decrease DLCO  IMPRESSION: 1) Severe COPD 2) Asbestos exposure without definitive evidence of asbestosis or pleural disease 3) Hypoxemia. SpO2 93% @ rest today 4) recent PNA - resolved   PLAN: 1) Continue Symbicort and duonebs 2) Cont nocturnal O2 and as needed during day 3) ROV 4 months  Merton Border, MD PCCM service Mobile [phone] Pager [phone] 05/21/2016

## 2016-05-23 ENCOUNTER — Ambulatory Visit: Payer: PPO | Admitting: *Deleted

## 2016-05-23 ENCOUNTER — Ambulatory Visit: Payer: Self-pay | Admitting: *Deleted

## 2016-05-23 ENCOUNTER — Other Ambulatory Visit: Payer: Self-pay | Admitting: *Deleted

## 2016-05-23 DIAGNOSIS — J441 Chronic obstructive pulmonary disease with (acute) exacerbation: Secondary | ICD-10-CM | POA: Diagnosis not present

## 2016-05-23 NOTE — Patient Outreach (Signed)
Attempt made to contact pt telephonically for ongoing transition of care.  HIPAA compliant voice message left with name and contact number.    Plan: if no response to voice message left today, will follow up again next week for  final transition of care call.      [name].   [place] Care Management  [phone]

## 2016-05-26 ENCOUNTER — Other Ambulatory Visit: Payer: Self-pay | Admitting: *Deleted

## 2016-05-26 ENCOUNTER — Ambulatory Visit: Payer: Self-pay | Admitting: *Deleted

## 2016-05-26 NOTE — Patient Outreach (Signed)
Final transition of care call (follow up on recent hospitalization 5/21-5/28 for community acquired pneumonia.    Received a return phone call from pt to voice message left earlier by RN CM.   Spoke with pt, HIPAA verified.   Pt reports doing good, saw Lung MD- told him about his congestion, indicated it will be chronic.   Pt reports he is using his oxygen at night, when he wakes up during the night, puts oxygen back on.   Pt reports working 3 days a week, 6 hours a day.  As discussed with pt, today is final transition of care call, no further case management needs, plan to close his case to which pt agreed.     Plan to fax Dr. Netty Starring case closure letter. Plan to inform Devin Martinez care management assistant to close case.    Devin Martinez.   [place] Care Management  [phone]

## 2016-05-26 NOTE — Patient Outreach (Signed)
Second attempt made to contact pt telephonically as part of ongoing transition of care.  Today would be the final transition of care call.   HIPAA compliant voice message left with contact name and number.    Plan:  If no response to voice message left today, plan to call again this week.     [name].   [place] Care Management  [phone]

## 2016-05-27 ENCOUNTER — Ambulatory Visit: Payer: Self-pay | Admitting: *Deleted

## 2016-05-27 ENCOUNTER — Encounter: Payer: Self-pay | Admitting: *Deleted

## 2016-06-11 ENCOUNTER — Telehealth: Payer: Self-pay | Admitting: Pulmonary Disease

## 2016-06-11 NOTE — Telephone Encounter (Signed)
Attempted to contact patient, left message for patient to return call.

## 2016-06-11 NOTE — Telephone Encounter (Signed)
Called spoke with pt. He reports is wanting a letter from Dr. Alva Garnet about his exposure to asbestos exposure.  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 He wants to try and get compensation for this.  Please advise Dr. Alva Garnet.  Thanks

## 2016-06-11 NOTE — Telephone Encounter (Signed)
Pt calling stating that last time he was here We stated to him we could hear he had some asbestos exposure Due to his work, and working right in it.  He found out that if this is true that he can get some Incompensation  Would like to know if we can possibly write a letter on this stating that due to his work conditions   he's been doing for years and it has caused him to get that along with his COPD Please advise if we can do this for him

## 2016-06-23 DIAGNOSIS — J441 Chronic obstructive pulmonary disease with (acute) exacerbation: Secondary | ICD-10-CM | POA: Diagnosis not present

## 2016-07-04 NOTE — Telephone Encounter (Signed)
Pt  returning call and can be reached @ [phone].Devin Martinez

## 2016-07-08 NOTE — Telephone Encounter (Signed)
It is not clear to me that his lung disease is attributable to asbestos exposure. His COPD is mostly or entirely due to smoking. I reviewed his chest Xray and there are no definitive findings of asbestos-related lung or pleural disease. We can discuss a CT of the chest (which would be the definitive test to look for this) at the next visit  Devin Martinez

## 2016-07-08 NOTE — Telephone Encounter (Signed)
Pt informed. Nothing further needed. 

## 2016-07-24 DIAGNOSIS — J441 Chronic obstructive pulmonary disease with (acute) exacerbation: Secondary | ICD-10-CM | POA: Diagnosis not present

## 2016-08-12 DIAGNOSIS — Z961 Presence of intraocular lens: Secondary | ICD-10-CM | POA: Diagnosis not present

## 2016-08-23 DIAGNOSIS — J441 Chronic obstructive pulmonary disease with (acute) exacerbation: Secondary | ICD-10-CM | POA: Diagnosis not present

## 2016-09-02 DIAGNOSIS — I10 Essential (primary) hypertension: Secondary | ICD-10-CM | POA: Diagnosis not present

## 2016-09-02 DIAGNOSIS — R7303 Prediabetes: Secondary | ICD-10-CM | POA: Diagnosis not present

## 2016-09-02 DIAGNOSIS — E782 Mixed hyperlipidemia: Secondary | ICD-10-CM | POA: Diagnosis not present

## 2016-09-11 DIAGNOSIS — Z0000 Encounter for general adult medical examination without abnormal findings: Secondary | ICD-10-CM | POA: Diagnosis not present

## 2016-09-11 DIAGNOSIS — R7303 Prediabetes: Secondary | ICD-10-CM | POA: Diagnosis not present

## 2016-09-11 DIAGNOSIS — I10 Essential (primary) hypertension: Secondary | ICD-10-CM | POA: Diagnosis not present

## 2016-09-11 DIAGNOSIS — E782 Mixed hyperlipidemia: Secondary | ICD-10-CM | POA: Diagnosis not present

## 2016-09-23 DIAGNOSIS — J441 Chronic obstructive pulmonary disease with (acute) exacerbation: Secondary | ICD-10-CM | POA: Diagnosis not present

## 2016-10-06 ENCOUNTER — Telehealth: Payer: Self-pay | Admitting: Pulmonary Disease

## 2016-10-06 NOTE — Telephone Encounter (Signed)
Faxed request to [place]. Awaiting response .  Requested report be faxed and images be mailed.

## 2016-10-06 NOTE — Telephone Encounter (Signed)
Patient attempted to get recent [facility] image results sent for upcoming ov. Will come to office to sign ROI for our office to fax a request for these images / results to the va   Fax: [phone]

## 2016-10-16 ENCOUNTER — Ambulatory Visit (INDEPENDENT_AMBULATORY_CARE_PROVIDER_SITE_OTHER): Payer: PPO | Admitting: Pulmonary Disease

## 2016-10-16 ENCOUNTER — Encounter: Payer: Self-pay | Admitting: Pulmonary Disease

## 2016-10-16 VITALS — BP 126/62 | HR 63 | Ht 72.0 in | Wt 210.0 lb

## 2016-10-16 DIAGNOSIS — R911 Solitary pulmonary nodule: Secondary | ICD-10-CM

## 2016-10-16 DIAGNOSIS — Z77090 Contact with and (suspected) exposure to asbestos: Secondary | ICD-10-CM | POA: Diagnosis not present

## 2016-10-16 DIAGNOSIS — J449 Chronic obstructive pulmonary disease, unspecified: Secondary | ICD-10-CM | POA: Diagnosis not present

## 2016-10-16 NOTE — Patient Instructions (Signed)
-   Continue Symbicort and Spiriva as your maintenance medications - Continue albuterol (nebulizer) as your rescue medication - Repeat CT scan of the chest planned in March 2018 - bring [place] copy of scan and/or report  - Follow up in 6 months or as needed

## 2016-10-21 NOTE — Progress Notes (Signed)
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 Using Duoneb 2-3 times per day. Using oxygen intermittently with exertion. Using Symbicort compliantly. Denies CP, fever, purulent sputum, hemoptysis, LE edema and calf tenderness. Here to review PFTs and CXR   OBJ: Vitals:   10/16/16 1204  BP: 126/62  Pulse: 63  SpO2: 97%  Weight: 210 lb (95.3 kg)  Height: 6' (1.829 m)  RA  Pleasant, NAD HEENT WNL No JVD Mildly decreased BS, faint bibasilar crackles, few scattered wheezes RRR s M NABS, soft R>L 1+ ankle edema   DATA: CXR 05/16/16: bilateral interstitial prominence improved. 2 RLL nodules that have appearance of benign granulomatous process  PFTs 05/16/16: Severe obstruction, mild-mod restriction, moderate decrease DLCO  IMPRESSION: 1) Severe COPD 2) Asbestos exposure without definitive evidence of asbestosis or pleural disease 3) Hypoxemia. SpO2 93% @ rest today 4) recent PNA - resolved   PLAN: - Continue Symbicort and Spiriva as maintenance medications - Continue albuterol (nebulizer) as rescue medication - Repeat CT scan of the chest planned in March 2018 - he is to bring [place] copy of scan and/or report  - Follow up in 6 months or as needed  Merton Border, MD PCCM service Mobile [phone] Pager [phone] 10/21/2016

## 2016-11-06 ENCOUNTER — Telehealth: Payer: Self-pay | Admitting: Pulmonary Disease

## 2016-11-06 DIAGNOSIS — J449 Chronic obstructive pulmonary disease, unspecified: Secondary | ICD-10-CM

## 2016-11-06 NOTE — Telephone Encounter (Signed)
Spoke with pt and he states a few months ago that the [place] told him he didn't need his O2. States he called [facility] and had them to pick up the O2. Says he gets SOB w/activity and wants to get requalified for O2. Is it ok to bring him in for a SMW to see if he qualifies? Please advise.

## 2016-11-06 NOTE — Telephone Encounter (Signed)
SMW scheduled for Monday 11/10/16 at 2:30 at [facility]. ONO order sent to [facility] to be done within one week. Patient is aware. Nothing else needed at this time. Rhonda J Cobb

## 2016-11-06 NOTE — Telephone Encounter (Signed)
Yes. Ambulatory SpO2 and ONO  Devin Martinez

## 2016-11-06 NOTE — Telephone Encounter (Signed)
Pt calling stating he did tests at the [facility] for his breathing and they told him he did not need any oxygen  But now he is using his nebulizer and inhalers a lot more now He can tell he does need the oxygen  He is asking since he did turn his nebulizer in, what needs to be done in order for him to get [facility] back [facility]  Its only been a few weeks He states he doesn't feel he would need it 24-7 just 3-4 times a day  He has also requested (at [place] ) to be retested so they can tell he needs the oxygen  Please advise

## 2016-11-06 NOTE — Telephone Encounter (Signed)
Devin Martinez, will schedule pt for ONO and SMW per DS. I believe pt is wanting SMW next week. Thanks.

## 2016-11-07 ENCOUNTER — Other Ambulatory Visit: Payer: Self-pay | Admitting: *Deleted

## 2016-11-07 DIAGNOSIS — J449 Chronic obstructive pulmonary disease, unspecified: Secondary | ICD-10-CM

## 2016-11-10 ENCOUNTER — Ambulatory Visit: Payer: PPO

## 2016-11-14 DIAGNOSIS — J441 Chronic obstructive pulmonary disease with (acute) exacerbation: Secondary | ICD-10-CM | POA: Diagnosis not present

## 2016-12-15 DIAGNOSIS — J441 Chronic obstructive pulmonary disease with (acute) exacerbation: Secondary | ICD-10-CM | POA: Diagnosis not present

## 2017-01-02 ENCOUNTER — Encounter: Payer: Self-pay | Admitting: Pulmonary Disease

## 2017-01-12 DIAGNOSIS — J441 Chronic obstructive pulmonary disease with (acute) exacerbation: Secondary | ICD-10-CM | POA: Diagnosis not present

## 2017-01-13 ENCOUNTER — Encounter: Payer: Non-veteran care | Attending: General Practice | Admitting: Respiratory Therapy

## 2017-01-13 VITALS — Ht 71.0 in | Wt 207.6 lb

## 2017-01-13 DIAGNOSIS — Z029 Encounter for administrative examinations, unspecified: Secondary | ICD-10-CM | POA: Insufficient documentation

## 2017-01-13 DIAGNOSIS — J449 Chronic obstructive pulmonary disease, unspecified: Secondary | ICD-10-CM

## 2017-01-13 NOTE — Patient Instructions (Signed)
Patient Instructions  Patient Details  Name: Devin Martinez MRN: [id number] Date of Birth: 1941-08-02 Referring Provider:  [place] are the personal goals you chose as well as exercise and nutrition goals. Our goal is to help you keep on track towards obtaining and maintaining your goals. We will be discussing your progress on these goals with you throughout the program.  Initial Exercise Prescription:     Initial Exercise Prescription - 01/13/17 1400      Date of Initial Exercise RX and Referring Provider   Date 01/13/17   Referring Provider [facility]     Treadmill   MPH 2   Grade 0.5   Minutes 15     Bike   Level 1   Minutes 15   METs 2     T5 Nustep   Level 2   SPM 60   Minutes 15   METs 2     Prescription Details   Frequency (times per week) 3   Duration Progress to 45 minutes of aerobic exercise without signs/symptoms of physical distress     Intensity   THRR 40-80% of Max Heartrate 99-128   Ratings of Perceived Exertion 11-15   Perceived Dyspnea 0-4     Resistance Training   Training Prescription Yes   Weight 3   Reps 10-15      Exercise Goals: Frequency: Be able to perform aerobic exercise three times per week working toward 3-5 days per week.  Intensity: Work with a perceived exertion of 11 (fairly light) - 15 (hard) as tolerated. Follow your new exercise prescription and watch for changes in prescription as you progress with the program. Changes will be reviewed with you when they are made.  Duration: You should be able to do 30 minutes of continuous aerobic exercise in addition to a 5 minute warm-up and a 5 minute cool-down routine.  Nutrition Goals: Your personal nutrition goals will be established when you do your nutrition analysis with the dietician.  The following are nutrition guidelines to follow: Cholesterol < 200mg /day Sodium < 1500mg /day Fiber: Men over 50 yrs - 30 grams per day  Personal Goals:     Personal Goals  and Risk Factors at Admission - 01/13/17 1451      Core Components/Risk Factors/Patient Goals on Admission   Improve shortness of breath with ADL's Yes   Intervention Provide education, individualized exercise plan and daily activity instruction to help decrease symptoms of SOB with activities of daily living.   Expected Outcomes Short Term: Achieves a reduction of symptoms when performing activities of daily living.   Develop more efficient breathing techniques such as purse lipped breathing and diaphragmatic breathing; and practicing self-pacing with activity Yes   Intervention Provide education, demonstration and support about specific breathing techniuqes utilized for more efficient breathing. Include techniques such as pursed lipped breathing, diaphragmatic breathing and self-pacing activity.   Expected Outcomes Short Term: Participant will be able to demonstrate and use breathing techniques as needed throughout daily activities.   Increase knowledge of respiratory medications and ability to use respiratory devices properly  Yes  Spiriva, Symbicort, SVN Albuterol; spacer given   Intervention Provide education and demonstration as needed of appropriate use of medications, inhalers, and oxygen therapy.   Expected Outcomes Short Term: Achieves understanding of medications use. Understands that oxygen is a medication prescribed by physician. Demonstrates appropriate use of inhaler and oxygen therapy.      Tobacco Use Initial Evaluation: History  Smoking Status  Former Smoker   Types: Cigarettes  Smokeless Tobacco   Never Used    Copy of goals given to participant.

## 2017-01-13 NOTE — Progress Notes (Signed)
Pulmonary Individual Treatment Plan  Patient Details  Name: Devin Martinez MRN: [id number] Date of Birth: [birth year] Referring Provider:     Pulmonary Rehab from 01/13/2017 in [facility] Cardiac and Pulmonary Rehab  Referring Provider  [place]      Initial Encounter Date:    Pulmonary Rehab from 01/13/2017 in [facility] Cardiac and Pulmonary Rehab  Date  01/13/17  Referring Provider  [facility]      Visit Diagnosis: Chronic obstructive pulmonary disease, unspecified COPD type ([place])  Patient's Home Medications on Admission:  Current Outpatient Prescriptions:    aspirin 81 MG tablet, Take 81 mg by mouth daily. Reported on 04/25/2016, Disp: , Rfl:    budesonide-formoterol (SYMBICORT) 160-4.5 MCG/ACT inhaler, Inhale 2 puffs into the lungs 2 (two) times daily., Disp: , Rfl:    enalapril-hydrochlorothiazide (VASERETIC) 10-25 MG per tablet, Take 1 tablet by mouth every other day. , Disp: , Rfl:    feeding supplement, ENSURE ENLIVE, (ENSURE ENLIVE) LIQD, Take 237 mLs by mouth 2 (two) times daily between meals., Disp: 237 mL, Rfl: 12   ipratropium-albuterol (DUONEB) 0.5-2.5 (3) MG/3ML SOLN, Take 3 mLs by nebulization every 6 (six) hours., Disp: 30 mL, Rfl: 1   Multiple Vitamin (MULTIVITAMIN) tablet, Take 1 tablet by mouth daily. Reported on 04/25/2016, Disp: , Rfl:    OVER THE COUNTER MEDICATION, Take 1 tablet by mouth daily as needed. Reported on 04/25/2016, Disp: , Rfl:    Red Yeast Rice Extract (RED YEAST RICE PO), Take 1 tablet by mouth daily.  , Disp: , Rfl:   Past Medical History: Past Medical History:  Diagnosis Date   Asthma    COPD (chronic obstructive pulmonary disease) (HCC)    Degenerative arthritis of left knee    Diabetes mellitus    Type II   Herpes    Hyperlipidemia    Hypertension    LVH (left ventricular hypertrophy)    Hx. of   Obesity    Smoker     Tobacco Use: History  Smoking Status   Former Smoker   Types: Cigarettes  Smokeless Tobacco   Never Used     Labs: Recent Review [facility] for [facility] Cardiac and Pulmonary Rehab Latest Ref Rng & Units 03/16/2016 03/17/2016   Hemoglobin A1c 4.0 - 6.0 % - 5.8   PHART 7.350 - 7.450 7.40 -   PCO2ART 32.0 - 48.0 mmHg 45 -   HCO3 21.0 - 28.0 mEq/L 27.9 -   O2SAT % 91.4 -       ADL UCSD:     Pulmonary Assessment Scores    Row Name 01/13/17 1446         ADL UCSD   ADL Phase Entry     SOB Score total 24     Rest 0     Walk 1     Stairs 2     Bath 3     Dress 1     Shop 1        Pulmonary Function Assessment:     Pulmonary Function Assessment - 01/13/17 1445      Pulmonary Function Tests   RV% 108 %   DLCO% 55 %     Initial Spirometry Results   FVC% 52 %   FEV1% 33 %   FEV1/FVC Ratio 47   Comments Test Date 05/16/16     Post Bronchodilator Spirometry Results   FVC% 53 %   FEV1% 36 %   FEV1/FVC Ratio 49  Breath   Bilateral Breath Sounds Decreased;Wheezes;Expiratory   Shortness of Breath Yes;Limiting activity      Exercise Target Goals: Date: 01/13/17  Exercise Program Goal: Individual exercise prescription set with THRR, safety & activity barriers. Participant demonstrates ability to understand and report RPE using BORG scale, to self-measure pulse accurately, and to acknowledge the importance of the exercise prescription.  Exercise Prescription Goal: Starting with aerobic activity 30 plus minutes a day, 3 days per week for initial exercise prescription. Provide home exercise prescription and guidelines that participant acknowledges understanding prior to discharge.  Activity Barriers & Risk Stratification:   6 Minute Walk:     6 Minute Walk    Row Name 01/13/17 1425         6 Minute Walk   Distance 1214 feet     Walk Time 6 minutes     # of Rest Breaks 0     MPH 2.29     METS 2.27     RPE 12     Perceived Dyspnea  2     VO2 Peak 7.95     Symptoms No     Resting HR 71 bpm     Resting BP 128/62     Max Ex. HR 97 bpm     Max Ex.  BP 126/58       Interval HR   Baseline HR 71     1 Minute HR 81     2 Minute HR 90     3 Minute HR 95     4 Minute HR 96     5 Minute HR 97     6 Minute HR 96     Interval Heart Rate? Yes       Interval Oxygen   Interval Oxygen? Yes     Baseline Oxygen Saturation % 94 %     1 Minute Oxygen Saturation % 93 %     2 Minute Oxygen Saturation % 90 %     3 Minute Oxygen Saturation % 91 %     4 Minute Oxygen Saturation % 92 %     5 Minute Oxygen Saturation % 92 %     6 Minute Oxygen Saturation % 93 %       Oxygen Initial Assessment:     Oxygen Initial Assessment - 01/13/17 1448      Home Oxygen   Home Oxygen Device Portable Concentrator;Home Concentrator   Sleep Oxygen Prescription Continuous   Liters per minute 1.5  as needed   Home Exercise Oxygen Prescription Continuous   Liters per minute 1.5  as needed   Home at Rest Exercise Oxygen Prescription Continuous   Liters per minute 1.5  as needed     Initial 6 min Walk   Oxygen Used None     Program Oxygen Prescription   Program Oxygen Prescription Continuous;E-Tanks   Liters per minute 1.5   Comments as needed     Intervention   Short Term Goals To learn and exhibit compliance with exercise, home and travel O2 prescription;To learn and understand importance of monitoring SPO2 with pulse oximeter and demonstrate accurate use of the pulse oximeter.;To Learn and understand importance of maintaining oxygen saturations>88%;To learn and demonstrate proper purse lipped breathing techniques or other breathing techniques.;To learn and demonstrate proper use of respiratory medications   Long  Term Goals Exhibits compliance with exercise, home and travel O2 prescription;Verbalizes importance of monitoring SPO2 with pulse oximeter and return demonstration;Maintenance of O2 saturations>88%;Exhibits  proper breathing techniques, such as purse lipped breathing or other method taught during program session;Compliance with respiratory  medication;Demonstrates proper use of MDIs      Oxygen Re-Evaluation:   Oxygen Discharge (Final Oxygen Re-Evaluation):   Initial Exercise Prescription:     Initial Exercise Prescription - 01/13/17 1400      Date of Initial Exercise RX and Referring Provider   Date 01/13/17   Referring Provider [facility]     Treadmill   MPH 2   Grade 0.5   Minutes 15     Bike   Level 1   Minutes 15   METs 2     T5 Nustep   Level 2   SPM 60   Minutes 15   METs 2     Prescription Details   Frequency (times per week) 3   Duration Progress to 45 minutes of aerobic exercise without signs/symptoms of physical distress     Intensity   THRR 40-80% of Max Heartrate 99-128   Ratings of Perceived Exertion 11-15   Perceived Dyspnea 0-4     Resistance Training   Training Prescription Yes   Weight 3   Reps 10-15      Perform Capillary Blood Glucose checks as needed.  Exercise Prescription Changes:   Exercise Comments:   Exercise Goals and Review:   Exercise Goals Re-Evaluation :   Discharge Exercise Prescription (Final Exercise Prescription Changes):   Nutrition:  Target Goals: Understanding of nutrition guidelines, daily intake of sodium 1500mg , cholesterol 200mg , calories 30% from fat and 7% or less from saturated fats, daily to have 5 or more servings of fruits and vegetables.  Biometrics:     Pre Biometrics - 01/13/17 1425      Pre Biometrics   Height 5\' 11"  (1.803 m)   Weight 207 lb 9.6 oz (94.2 kg)   Waist Circumference 46 inches   Hip Circumference 44 inches   Waist to Hip Ratio 1.05 %   BMI (Calculated) 29       Nutrition Therapy Plan and Nutrition Goals:   Nutrition Discharge: Rate Your Plate Scores:   Nutrition Goals Re-Evaluation:   Nutrition Goals Discharge (Final Nutrition Goals Re-Evaluation):   Psychosocial: Target Goals: Acknowledge presence or absence of significant depression and/or stress, maximize coping skills, provide positive  support system. Participant is able to verbalize types and ability to use techniques and skills needed for reducing stress and depression.   Initial Review & Psychosocial Screening:     Initial Psych Review & Screening - 01/13/17 [place]? Yes   Comments Mr Carrera has good support from his daughter, grandchildren, and his church family. He is looking forward to participating in [place].     Barriers   Psychosocial barriers to participate in program There are no identifiable barriers or psychosocial needs.;The patient should benefit from training in stress management and relaxation.     Screening Interventions   Interventions Encouraged to exercise      Quality of Life Scores:     Quality of Life - 01/13/17 1453      Quality of Life Scores   Health/Function Pre 20.38 %   Socioeconomic Pre 20 %   Psych/Spiritual Pre 20.29 %   Family Pre 20.6 %   GLOBAL Pre 20.32 %      PHQ-9: Recent Review Flowsheet Data    Depression screen [facility] 2/9 01/13/2017 04/25/2016   Decreased Interest 0 0  Down, Depressed, Hopeless 0 0   PHQ - 2 Score 0 0   Altered sleeping 0 -   Tired, decreased energy 1 -   Change in appetite 0 -   Feeling bad or failure about yourself  1 -   Trouble concentrating 0 -   Moving slowly or fidgety/restless 0 -   Suicidal thoughts 0 -   PHQ-9 Score 2 -     Interpretation of Total Score  Total Score Depression Severity:  1-4 = Minimal depression, 5-9 = Mild depression, 10-14 = Moderate depression, 15-19 = Moderately severe depression, 20-27 = Severe depression   Psychosocial Evaluation and Intervention:   Psychosocial Re-Evaluation:   Psychosocial Discharge (Final Psychosocial Re-Evaluation):   Education: Education Goals: Education classes will be provided on a weekly basis, covering required topics. Participant will state understanding/return demonstration of topics presented.  Learning Barriers/Preferences:      Learning Barriers/Preferences - 01/13/17 1444      Learning Barriers/Preferences   Learning Barriers None   Learning Preferences None      Education Topics: Initial Evaluation Education: - Verbal, written and demonstration of respiratory meds, RPE/PD scales, oximetry and breathing techniques. Instruction on use of nebulizers and MDIs: cleaning and proper use, rinsing mouth with steroid doses and importance of monitoring MDI activations.   Pulmonary Rehab from 01/13/2017 in [facility] Cardiac and Pulmonary Rehab  Date  01/13/17  Educator  LB  Instruction Review Code  2- meets goals/outcomes      General Nutrition Guidelines/Fats and Fiber: -Group instruction provided by verbal, written material, models and posters to present the general guidelines for heart healthy nutrition. Gives an explanation and review of dietary fats and fiber.   Controlling Sodium/Reading Food Labels: -Group verbal and written material supporting the discussion of sodium use in heart healthy nutrition. Review and explanation with models, verbal and written materials for utilization of the food label.   Exercise Physiology & Risk Factors: - Group verbal and written instruction with models to review the exercise physiology of the cardiovascular system and associated critical values. Details cardiovascular disease risk factors and the goals associated with each risk factor.   Aerobic Exercise & Resistance Training: - Gives group verbal and written discussion on the health impact of inactivity. On the components of aerobic and resistive training programs and the benefits of this training and how to safely progress through these programs.   Flexibility, Balance, General Exercise Guidelines: - Provides group verbal and written instruction on the benefits of flexibility and balance training programs. Provides general exercise guidelines with specific guidelines to those with heart or lung disease. Demonstration and  skill practice provided.   Stress Management: - Provides group verbal and written instruction about the health risks of elevated stress, cause of high stress, and healthy ways to reduce stress.   Depression: - Provides group verbal and written instruction on the correlation between heart/lung disease and depressed mood, treatment options, and the stigmas associated with seeking treatment.   Exercise & Equipment Safety: - Individual verbal instruction and demonstration of equipment use and safety with use of the equipment.   Infection Prevention: - Provides verbal and written material to individual with discussion of infection control including proper hand washing and proper equipment cleaning during exercise session.   Pulmonary Rehab from 01/13/2017 in [facility] Cardiac and Pulmonary Rehab  Date  01/13/17  Educator  LB  Instruction Review Code  2- meets goals/outcomes      Falls Prevention: - Provides verbal and written material  to individual with discussion of falls prevention and safety.   Pulmonary Rehab from 01/13/2017 in [facility] Cardiac and Pulmonary Rehab  Date  01/13/17  Educator  LB  Instruction Review Code  2- meets goals/outcomes      Diabetes: - Individual verbal and written instruction to review signs/symptoms of diabetes, desired ranges of glucose level fasting, after meals and with exercise. Advice that pre and post exercise glucose checks will be done for 3 sessions at entry of program.   Chronic Lung Diseases: - Group verbal and written instruction to review new updates, new respiratory medications, new advancements in procedures and treatments. Provide informative websites and "800" numbers of self-education.   Lung Procedures: - Group verbal and written instruction to describe testing methods done to diagnose lung disease. Review the outcome of test results. Describe the treatment choices: Pulmonary Function Tests, ABGs and oximetry.   Energy Conservation: -  Provide group verbal and written instruction for methods to conserve energy, plan and organize activities. Instruct on pacing techniques, use of adaptive equipment and posture/positioning to relieve shortness of breath.   Triggers: - Group verbal and written instruction to review types of environmental controls: home humidity, furnaces, filters, dust mite/pet prevention, HEPA vacuums. To discuss weather changes, air quality and the benefits of nasal washing.   Exacerbations: - Group verbal and written instruction to provide: warning signs, infection symptoms, calling MD promptly, preventive modes, and value of vaccinations. Review: effective airway clearance, coughing and/or vibration techniques. Create an Sports administrator.   Oxygen: - Individual and group verbal and written instruction on oxygen therapy. Includes supplement oxygen, available portable oxygen systems, continuous and intermittent flow rates, oxygen safety, concentrators, and Medicare reimbursement for oxygen.   Pulmonary Rehab from 01/13/2017 in [facility] Cardiac and Pulmonary Rehab  Date  01/13/17  Educator  LB  Instruction Review Code  2- meets goals/outcomes      Respiratory Medications: - Group verbal and written instruction to review medications for lung disease. Drug class, frequency, complications, importance of spacers, rinsing mouth after steroid MDI's, and proper cleaning methods for nebulizers.   Pulmonary Rehab from 01/13/2017 in [facility] Cardiac and Pulmonary Rehab  Date  01/13/17  Educator  LB  Instruction Review Code  2- meets goals/outcomes      AED/CPR: - Group verbal and written instruction with the use of models to demonstrate the basic use of the AED with the basic ABC's of resuscitation.   Breathing Retraining: - Provides individuals verbal and written instruction on purpose, frequency, and proper technique of diaphragmatic breathing and pursed-lipped breathing. Applies individual practice skills.   Pulmonary  Rehab from 01/13/2017 in [facility] Cardiac and Pulmonary Rehab  Date  01/13/17  Educator  LB  Instruction Review Code  2- meets goals/outcomes      Anatomy and Physiology of the Lungs: - Group verbal and written instruction with the use of models to provide basic lung anatomy and physiology related to function, structure and complications of lung disease.   Heart Failure: - Group verbal and written instruction on the basics of heart failure: signs/symptoms, treatments, explanation of ejection fraction, enlarged heart and cardiomyopathy.   Sleep Apnea: - Individual verbal and written instruction to review Obstructive Sleep Apnea. Review of risk factors, methods for diagnosing and types of masks and machines for OSA.   Anxiety: - Provides group, verbal and written instruction on the correlation between heart/lung disease and anxiety, treatment options, and management of anxiety.   Relaxation: - Provides group, verbal and written instruction  about the benefits of relaxation for patients with heart/lung disease. Also provides patients with examples of relaxation techniques.   Knowledge Questionnaire Score:     Knowledge Questionnaire Score - 01/13/17 1444      Knowledge Questionnaire Score   Pre Score 9/10       Core Components/Risk Factors/Patient Goals at Admission:     Personal Goals and Risk Factors at Admission - 01/13/17 1451      Core Components/Risk Factors/Patient Goals on Admission   Improve shortness of breath with ADL's Yes   Intervention Provide education, individualized exercise plan and daily activity instruction to help decrease symptoms of SOB with activities of daily living.   Expected Outcomes Short Term: Achieves a reduction of symptoms when performing activities of daily living.   Develop more efficient breathing techniques such as purse lipped breathing and diaphragmatic breathing; and practicing self-pacing with activity Yes   Intervention Provide  education, demonstration and support about specific breathing techniuqes utilized for more efficient breathing. Include techniques such as pursed lipped breathing, diaphragmatic breathing and self-pacing activity.   Expected Outcomes Short Term: Participant will be able to demonstrate and use breathing techniques as needed throughout daily activities.   Increase knowledge of respiratory medications and ability to use respiratory devices properly  Yes  Spiriva, Symbicort, SVN Albuterol; spacer given   Intervention Provide education and demonstration as needed of appropriate use of medications, inhalers, and oxygen therapy.   Expected Outcomes Short Term: Achieves understanding of medications use. Understands that oxygen is a medication prescribed by physician. Demonstrates appropriate use of inhaler and oxygen therapy.      Core Components/Risk Factors/Patient Goals Review:    Core Components/Risk Factors/Patient Goals at Discharge (Final Review):    ITP Comments:   Comments: Mr Lall plans to start [facility] on 01/25/17 and attend 3 days/week.

## 2017-01-26 ENCOUNTER — Encounter: Payer: Non-veteran care | Attending: Internal Medicine | Admitting: *Deleted

## 2017-01-26 DIAGNOSIS — J449 Chronic obstructive pulmonary disease, unspecified: Secondary | ICD-10-CM | POA: Insufficient documentation

## 2017-01-26 NOTE — Progress Notes (Signed)
Daily Session Note  Patient Details  Name: Devin Martinez MRN: [id number] Date of Birth: 1941/08/29 Referring Provider:     Pulmonary Rehab from 01/13/2017 in [facility] Cardiac and Pulmonary Rehab  Referring Provider  [facility]      Encounter Date: 01/26/2017  Check In:     Session Check In - 01/26/17 1220      Check-In   Location [facility]-Cardiac & Pulmonary Rehab   Staff Present Nada Maclachlan, BA, ACSM CEP, Exercise Physiologist;Laureen Owens Shark, BS, RRT, Respiratory Bertis Ruddy, BS, ACSM CEP, Exercise Physiologist   Supervising physician immediately available to respond to emergencies [facility] immediately available ER MD   Physician(s) Quentin Cornwall and Jimmye Norman    Medication changes reported     No   Fall or balance concerns reported    No   Warm-up and Cool-down Performed as group-led instruction   Resistance Training Performed Yes   VAD Patient? No     Pain Assessment   Currently in Pain? No/denies   Multiple Pain Sites No           Exercise Prescription Changes - 01/26/17 1200      Response to Exercise   Blood Pressure (Admit) 128/70   Blood Pressure (Exercise) 154/82   Blood Pressure (Exit) 112/70   Heart Rate (Admit) 83 bpm   Heart Rate (Exercise) 107 bpm   Heart Rate (Exit) 97 bpm   Oxygen Saturation (Admit) 95 %   Oxygen Saturation (Exercise) 94 %   Oxygen Saturation (Exit) 96 %   Rating of Perceived Exertion (Exercise) 13   Perceived Dyspnea (Exercise) 3     Resistance Training   Training Prescription Yes   Weight 3   Reps 10-15     Treadmill   MPH 2   Grade 0.5   Minutes 15     Bike   Level 1   Minutes 15   METs 2     T5 Nustep   Level 2   SPM 60   Minutes 15   METs 2      History  Smoking Status  . Former Smoker  . Types: Cigarettes  Smokeless Tobacco  . Never Used    Goals Met:  Proper associated with RPD/PD & O2 Sat Exercise tolerated well Personal goals reviewed Strength training completed today  Goals Unmet:  Not  Applicable  Comments: First full day of exercise!  Patient was oriented to gym and equipment including functions, settings, policies, and procedures.  Patient's individual exercise prescription and treatment plan were reviewed.  All starting workloads were established based on the results of the 6 minute walk test done at initial orientation visit.  The plan for exercise progression was also introduced and progression will be customized based on patient's performance and goals.    Dr. Emily Filbert is Medical Director for [place] and [facility].

## 2017-01-28 DIAGNOSIS — J449 Chronic obstructive pulmonary disease, unspecified: Secondary | ICD-10-CM | POA: Diagnosis not present

## 2017-01-28 NOTE — Progress Notes (Signed)
Daily Session Note  Patient Details  Name: Devin Martinez MRN: [id number] Date of Birth: 16-Mar-1941 Referring Provider:     Pulmonary Rehab from 01/13/2017 in [facility] Cardiac and Pulmonary Rehab  Referring Provider  [facility]      Encounter Date: 01/28/2017  Check In:     Session Check In - 01/28/17 1029      Check-In   Location [facility]-Cardiac & Pulmonary Rehab   Staff Present Alberteen Sam, MA, ACSM RCEP, Exercise Physiologist;Laureen Owens Shark, BS, RRT, Respiratory Dareen Piano, BA, ACSM CEP, Exercise Physiologist   Supervising physician immediately available to respond to emergencies [facility] immediately available ER MD   Physician(s) Drs. Lord and [employer]   Medication changes reported     No   Fall or balance concerns reported    No   Warm-up and Cool-down Performed as group-led Location manager Performed Yes   VAD Patient? No     Pain Assessment   Currently in Pain? No/denies   Multiple Pain Sites No         History  Smoking Status  . Former Smoker  . Types: Cigarettes  Smokeless Tobacco  . Never Used    Goals Met:  Proper associated with RPD/PD & O2 Sat Independence with exercise equipment Exercise tolerated well Strength training completed today  Goals Unmet:  Not Applicable  Comments: Pt able to follow exercise prescription today without complaint.  Will continue to monitor for progression.    Dr. Emily Filbert is Medical Director for [place] and [facility].

## 2017-01-30 ENCOUNTER — Encounter: Payer: Non-veteran care | Admitting: *Deleted

## 2017-01-30 DIAGNOSIS — J449 Chronic obstructive pulmonary disease, unspecified: Secondary | ICD-10-CM

## 2017-01-30 NOTE — Progress Notes (Signed)
Daily Session Note  Patient Details  Name: Devin Martinez MRN: [id number] Date of Birth: [birth year] Referring Provider:     Pulmonary Rehab from 01/13/2017 in [facility] Cardiac and Pulmonary Rehab  Referring Provider  [facility]      Encounter Date: 01/30/2017  Check In:     Session Check In - 01/30/17 1117      Check-In   Location [facility]-Cardiac & Pulmonary Rehab   Staff Present Gerlene Burdock, RN, Vickki Hearing, BA, ACSM CEP, Exercise Physiologist;Other  Darel Hong, RN   Supervising physician immediately available to respond to emergencies [facility] immediately available ER MD   Physician(s) Dr. Corky Downs and Dr. Mable Paris   Medication changes reported     No   Fall or balance concerns reported    No   Warm-up and Cool-down Performed on first and last piece of equipment   Resistance Training Performed Yes   VAD Patient? No     Pain Assessment   Currently in Pain? No/denies         History  Smoking Status  . Former Smoker  . Types: Cigarettes  Smokeless Tobacco  . Never Used    Goals Met:  Proper associated with RPD/PD & O2 Sat Exercise tolerated well  Goals Unmet:  Not Applicable  Comments:     Dr. Emily Filbert is Medical Director for [place] and [facility].

## 2017-02-02 ENCOUNTER — Encounter: Payer: Self-pay | Admitting: Respiratory Therapy

## 2017-02-02 DIAGNOSIS — J449 Chronic obstructive pulmonary disease, unspecified: Secondary | ICD-10-CM | POA: Diagnosis not present

## 2017-02-02 NOTE — Progress Notes (Signed)
Daily Session Note  Patient Details  Name: Devin Martinez MRN: [id number] Date of Birth: [birth year] Referring Provider:     Pulmonary Rehab from 01/13/2017 in [facility] Cardiac and Pulmonary Rehab  Referring Provider  [facility]      Encounter Date: 02/02/2017  Check In:     Session Check In - 02/02/17 1149      Check-In   Location [facility]-Cardiac & Pulmonary Rehab   Staff Present Earlean Shawl, BS, ACSM CEP, Exercise Physiologist;Laureen Owens Shark, BS, RRT, Respiratory Dareen Piano, BA, ACSM CEP, Exercise Physiologist   Supervising physician immediately available to respond to emergencies See telemetry face sheet for immediately available ER MD   Physician(s) Corky Downs and Jimmye Norman   Medication changes reported     No   Fall or balance concerns reported    No   Warm-up and Cool-down Performed as group-led instruction   Resistance Training Performed Yes   VAD Patient? No     Pain Assessment   Currently in Pain? No/denies         History  Smoking Status  . Former Smoker  . Types: Cigarettes  Smokeless Tobacco  . Never Used    Goals Met:  Proper associated with RPD/PD & O2 Sat Independence with exercise equipment Exercise tolerated well Strength training completed today  Goals Unmet:  Not Applicable  Comments: Pt able to follow exercise prescription today without complaint.  Will continue to monitor for progression.    Dr. Emily Filbert is Medical Director for [place] and [facility].

## 2017-02-02 NOTE — Progress Notes (Signed)
Pulmonary Individual Treatment Plan  Patient Details  Name: Devin Martinez MRN: [id number] Date of Birth: [birth year] Referring Provider:     Pulmonary Rehab from 01/13/2017 in [facility] Cardiac and Pulmonary Rehab  Referring Provider  [place]      Initial Encounter Date:    Pulmonary Rehab from 01/13/2017 in [facility] Cardiac and Pulmonary Rehab  Date  01/13/17  Referring Provider  [facility]      Visit Diagnosis: Chronic obstructive pulmonary disease, unspecified COPD type ([place])  Patient's Home Medications on Admission:  Current Outpatient Prescriptions:    aspirin 81 MG tablet, Take 81 mg by mouth daily. Reported on 04/25/2016, Disp: , Rfl:    budesonide-formoterol (SYMBICORT) 160-4.5 MCG/ACT inhaler, Inhale 2 puffs into the lungs 2 (two) times daily., Disp: , Rfl:    enalapril-hydrochlorothiazide (VASERETIC) 10-25 MG per tablet, Take 1 tablet by mouth every other day. , Disp: , Rfl:    feeding supplement, ENSURE ENLIVE, (ENSURE ENLIVE) LIQD, Take 237 mLs by mouth 2 (two) times daily between meals., Disp: 237 mL, Rfl: 12   ipratropium-albuterol (DUONEB) 0.5-2.5 (3) MG/3ML SOLN, Take 3 mLs by nebulization every 6 (six) hours., Disp: 30 mL, Rfl: 1   Multiple Vitamin (MULTIVITAMIN) tablet, Take 1 tablet by mouth daily. Reported on 04/25/2016, Disp: , Rfl:    OVER THE COUNTER MEDICATION, Take 1 tablet by mouth daily as needed. Reported on 04/25/2016, Disp: , Rfl:    Red Yeast Rice Extract (RED YEAST RICE PO), Take 1 tablet by mouth daily.  , Disp: , Rfl:   Past Medical History: Past Medical History:  Diagnosis Date   Asthma    COPD (chronic obstructive pulmonary disease) (HCC)    Degenerative arthritis of left knee    Diabetes mellitus    Type II   Herpes    Hyperlipidemia    Hypertension    LVH (left ventricular hypertrophy)    Hx. of   Obesity    Smoker     Tobacco Use: History  Smoking Status   Former Smoker   Types: Cigarettes  Smokeless Tobacco   Never Used     Labs: Recent Review [facility] for [facility] Cardiac and Pulmonary Rehab Latest Ref Rng & Units 03/16/2016 03/17/2016   Hemoglobin A1c 4.0 - 6.0 % - 5.8   PHART 7.350 - 7.450 7.40 -   PCO2ART 32.0 - 48.0 mmHg 45 -   HCO3 21.0 - 28.0 mEq/L 27.9 -   O2SAT % 91.4 -       ADL UCSD:     Pulmonary Assessment Scores    Row Name 01/13/17 1446         ADL UCSD   ADL Phase Entry     SOB Score total 24     Rest 0     Walk 1     Stairs 2     Bath 3     Dress 1     Shop 1        Pulmonary Function Assessment:     Pulmonary Function Assessment - 01/13/17 1445      Pulmonary Function Tests   RV% 108 %   DLCO% 55 %     Initial Spirometry Results   FVC% 52 %   FEV1% 33 %   FEV1/FVC Ratio 47   Comments Test Date 05/16/16     Post Bronchodilator Spirometry Results   FVC% 53 %   FEV1% 36 %   FEV1/FVC Ratio 49  Breath   Bilateral Breath Sounds Decreased;Wheezes;Expiratory   Shortness of Breath Yes;Limiting activity      Exercise Target Goals:    Exercise Program Goal: Individual exercise prescription set with THRR, safety & activity barriers. Participant demonstrates ability to understand and report RPE using BORG scale, to self-measure pulse accurately, and to acknowledge the importance of the exercise prescription.  Exercise Prescription Goal: Starting with aerobic activity 30 plus minutes a day, 3 days per week for initial exercise prescription. Provide home exercise prescription and guidelines that participant acknowledges understanding prior to discharge.  Activity Barriers & Risk Stratification:   6 Minute Walk:     6 Minute Walk    Row Name 01/13/17 1425         6 Minute Walk   Distance 1214 feet     Walk Time 6 minutes     # of Rest Breaks 0     MPH 2.29     METS 2.27     RPE 12     Perceived Dyspnea  2     VO2 Peak 7.95     Symptoms No     Resting HR 71 bpm     Resting BP 128/62     Max Ex. HR 97 bpm     Max Ex. BP 126/58        Interval HR   Baseline HR 71     1 Minute HR 81     2 Minute HR 90     3 Minute HR 95     4 Minute HR 96     5 Minute HR 97     6 Minute HR 96     Interval Heart Rate? Yes       Interval Oxygen   Interval Oxygen? Yes     Baseline Oxygen Saturation % 94 %     1 Minute Oxygen Saturation % 93 %     2 Minute Oxygen Saturation % 90 %     3 Minute Oxygen Saturation % 91 %     4 Minute Oxygen Saturation % 92 %     5 Minute Oxygen Saturation % 92 %     6 Minute Oxygen Saturation % 93 %       Oxygen Initial Assessment:     Oxygen Initial Assessment - 01/13/17 1448      Home Oxygen   Home Oxygen Device Portable Concentrator;Home Concentrator   Sleep Oxygen Prescription Continuous   Liters per minute 1.5  as needed   Home Exercise Oxygen Prescription Continuous   Liters per minute 1.5  as needed   Home at Rest Exercise Oxygen Prescription Continuous   Liters per minute 1.5  as needed     Initial 6 min Walk   Oxygen Used None     Program Oxygen Prescription   Program Oxygen Prescription Continuous;E-Tanks   Liters per minute 1.5   Comments as needed     Intervention   Short Term Goals To learn and exhibit compliance with exercise, home and travel O2 prescription;To learn and understand importance of monitoring SPO2 with pulse oximeter and demonstrate accurate use of the pulse oximeter.;To Learn and understand importance of maintaining oxygen saturations>88%;To learn and demonstrate proper purse lipped breathing techniques or other breathing techniques.;To learn and demonstrate proper use of respiratory medications   Long  Term Goals Exhibits compliance with exercise, home and travel O2 prescription;Verbalizes importance of monitoring SPO2 with pulse oximeter and return demonstration;Maintenance of O2 saturations>88%;Exhibits  proper breathing techniques, such as purse lipped breathing or other method taught during program session;Compliance with respiratory  medication;Demonstrates proper use of MDIs      Oxygen Re-Evaluation:   Oxygen Discharge (Final Oxygen Re-Evaluation):   Initial Exercise Prescription:     Initial Exercise Prescription - 01/13/17 1400      Date of Initial Exercise RX and Referring Provider   Date 01/13/17   Referring Provider [facility]     Treadmill   MPH 2   Grade 0.5   Minutes 15     Bike   Level 1   Minutes 15   METs 2     T5 Nustep   Level 2   SPM 60   Minutes 15   METs 2     Prescription Details   Frequency (times per week) 3   Duration Progress to 45 minutes of aerobic exercise without signs/symptoms of physical distress     Intensity   THRR 40-80% of Max Heartrate 99-128   Ratings of Perceived Exertion 11-15   Perceived Dyspnea 0-4     Resistance Training   Training Prescription Yes   Weight 3   Reps 10-15      Perform Capillary Blood Glucose checks as needed.  Exercise Prescription Changes:     Exercise Prescription Changes    Row Name 01/26/17 1200             Response to Exercise   Blood Pressure (Admit) 128/70       Blood Pressure (Exercise) 154/82       Blood Pressure (Exit) 112/70       Heart Rate (Admit) 83 bpm       Heart Rate (Exercise) 107 bpm       Heart Rate (Exit) 97 bpm       Oxygen Saturation (Admit) 95 %       Oxygen Saturation (Exercise) 94 %       Oxygen Saturation (Exit) 96 %       Rating of Perceived Exertion (Exercise) 13       Perceived Dyspnea (Exercise) 3         Resistance Training   Training Prescription Yes       Weight 3       Reps 10-15         Treadmill   MPH 2       Grade 0.5       Minutes 15         Bike   Level 1       Minutes 15       METs 2         T5 Nustep   Level 2       SPM 60       Minutes 15       METs 2          Exercise Comments:     Exercise Comments    Row Name 01/26/17 1226           Exercise Comments  First full day of exercise!  Patient was oriented to gym and equipment including functions,  settings, policies, and procedures.  Patient's individual exercise prescription and treatment plan were reviewed.  All starting workloads were established based on the results of the 6 minute walk test done at initial orientation visit.  The plan for exercise progression was also introduced and progression will be customized based on patient's performance and goals  Exercise Goals and Review:   Exercise Goals Re-Evaluation :   Discharge Exercise Prescription (Final Exercise Prescription Changes):     Exercise Prescription Changes - 01/26/17 1200      Response to Exercise   Blood Pressure (Admit) 128/70   Blood Pressure (Exercise) 154/82   Blood Pressure (Exit) 112/70   Heart Rate (Admit) 83 bpm   Heart Rate (Exercise) 107 bpm   Heart Rate (Exit) 97 bpm   Oxygen Saturation (Admit) 95 %   Oxygen Saturation (Exercise) 94 %   Oxygen Saturation (Exit) 96 %   Rating of Perceived Exertion (Exercise) 13   Perceived Dyspnea (Exercise) 3     Resistance Training   Training Prescription Yes   Weight 3   Reps 10-15     Treadmill   MPH 2   Grade 0.5   Minutes 15     Bike   Level 1   Minutes 15   METs 2     T5 Nustep   Level 2   SPM 60   Minutes 15   METs 2      Nutrition:  Target Goals: Understanding of nutrition guidelines, daily intake of sodium <1541m, cholesterol <2099m calories 30% from fat and 7% or less from saturated fats, daily to have 5 or more servings of fruits and vegetables.  Biometrics:     Pre Biometrics - 01/13/17 1425      Pre Biometrics   Height _0  (1.803 m)   Weight 207 lb 9.6 oz (94.2 kg)   Waist Circumference 46 inches   Hip Circumference 44 inches   Waist to Hip Ratio 1.05 %   BMI (Calculated) 29       Nutrition Therapy Plan and Nutrition Goals:   Nutrition Discharge: Rate Your Plate Scores:   Nutrition Goals Re-Evaluation:   Nutrition Goals Discharge (Final Nutrition Goals Re-Evaluation):   Psychosocial: Target  Goals: Acknowledge presence or absence of significant depression and/or stress, maximize coping skills, provide positive support system. Participant is able to verbalize types and ability to use techniques and skills needed for reducing stress and depression.   Initial Review & Psychosocial Screening:     Initial Psych Review & Screening - 01/13/17 14OrrtannaYes   Comments Mr GiDibelloas good support from his daughter, grandchildren, and his church family. He is looking forward to participating in [place]    Barriers   Psychosocial barriers to participate in program There are no identifiable barriers or psychosocial needs.;The patient should benefit from training in stress management and relaxation.     Screening Interventions   Interventions Encouraged to exercise      Quality of Life Scores:     Quality of Life - 01/13/17 1453      Quality of Life Scores   Health/Function Pre 20.38 %   Socioeconomic Pre 20 %   Psych/Spiritual Pre 20.29 %   Family Pre 20.6 %   GLOBAL Pre 20.32 %      PHQ-9: Recent Review Flowsheet Data    [facility] 01/13/2017 04/25/2016   Decreased Interest 0 0   Down, Depressed, Hopeless 0 0   PHQ - 2 Score 0 0   Altered sleeping 0 -   Tired, decreased energy 1 -   Change in appetite 0 -   Feeling bad or failure about yourself  1 -   Trouble concentrating 0 -   Moving slowly  or fidgety/restless 0 -   Suicidal thoughts 0 -   PHQ-9 Score 2 -     Interpretation of Total Score  Total Score Depression Severity:  1-4 = Minimal depression, 5-9 = Mild depression, 10-14 = Moderate depression, 15-19 = Moderately severe depression, 20-27 = Severe depression   Psychosocial Evaluation and Intervention:     Psychosocial Evaluation - 01/28/17 1053      Psychosocial Evaluation & Interventions   Interventions Encouraged to exercise with the program and follow exercise prescription;Stress management  education;Relaxation education   Comments Counselor met with Mr. [facility]) today for intitial psychosocial evaluation.   He is a 76 yr old (62 later this month).  He has a diagnosis of COPD and was referred to this program by the [place].  Rocio lives alone but has family & friends close by; and is actively involved in his local church.  He reports sleeping well; having a good appetite; and is in a positive mood most of the time.  He denies a history of depression or anxiety or any current symptoms.  He admits that his family and his health are his primary stressors currently.  Jaeden has goals to be more flexible in his body and to breathe better while in this program.  Staff will be following with him throughout the course of this program.     Expected Outcomes Nicholi will exercise consistently to accomplish his goals for this program.  He will also attend and participate in the psychoeducational components of this program to help with managing his stress in a positive way.  Staff will follow with Maria.   Continue Psychosocial Services  Follow up required by staff      Psychosocial Re-Evaluation:   Psychosocial Discharge (Final Psychosocial Re-Evaluation):   Education: Education Goals: Education classes will be provided on a weekly basis, covering required topics. Participant will state understanding/return demonstration of topics presented.  Learning Barriers/Preferences:     Learning Barriers/Preferences - 01/13/17 1444      Learning Barriers/Preferences   Learning Barriers None   Learning Preferences None      Education Topics: Initial Evaluation Education: - Verbal, written and demonstration of respiratory meds, RPE/PD scales, oximetry and breathing techniques. Instruction on use of nebulizers and MDIs: cleaning and proper use, rinsing mouth with steroid doses and importance of monitoring MDI activations.   Pulmonary Rehab from 01/28/2017 in [facility] Cardiac and Pulmonary Rehab  Date   01/13/17  Educator  LB  Instruction Review Code  2- meets goals/outcomes      General Nutrition Guidelines/Fats and Fiber: -Group instruction provided by verbal, written material, models and posters to present the general guidelines for heart healthy nutrition. Gives an explanation and review of dietary fats and fiber.   Controlling Sodium/Reading Food Labels: -Group verbal and written material supporting the discussion of sodium use in heart healthy nutrition. Review and explanation with models, verbal and written materials for utilization of the food label.   Exercise Physiology & Risk Factors: - Group verbal and written instruction with models to review the exercise physiology of the cardiovascular system and associated critical values. Details cardiovascular disease risk factors and the goals associated with each risk factor.   Aerobic Exercise & Resistance Training: - Gives group verbal and written discussion on the health impact of inactivity. On the components of aerobic and resistive training programs and the benefits of this training and how to safely progress through these programs.   Flexibility, Balance, General Exercise Guidelines: - Provides  group verbal and written instruction on the benefits of flexibility and balance training programs. Provides general exercise guidelines with specific guidelines to those with heart or lung disease. Demonstration and skill practice provided.   Stress Management: - Provides group verbal and written instruction about the health risks of elevated stress, cause of high stress, and healthy ways to reduce stress.   Depression: - Provides group verbal and written instruction on the correlation between heart/lung disease and depressed mood, treatment options, and the stigmas associated with seeking treatment.   Exercise & Equipment Safety: - Individual verbal instruction and demonstration of equipment use and safety with use of the  equipment.   Pulmonary Rehab from 01/28/2017 in [facility] Cardiac and Pulmonary Rehab  Date  01/26/17  Educator  AS  Instruction Review Code  2- meets goals/outcomes      Infection Prevention: - Provides verbal and written material to individual with discussion of infection control including proper hand washing and proper equipment cleaning during exercise session.   Pulmonary Rehab from 01/28/2017 in [facility] Cardiac and Pulmonary Rehab  Date  01/26/17  Educator  AS  Instruction Review Code  2- meets goals/outcomes      Falls Prevention: - Provides verbal and written material to individual with discussion of falls prevention and safety.   Pulmonary Rehab from 01/28/2017 in [facility] Cardiac and Pulmonary Rehab  Date  01/13/17  Educator  LB  Instruction Review Code  2- meets goals/outcomes      Diabetes: - Individual verbal and written instruction to review signs/symptoms of diabetes, desired ranges of glucose level fasting, after meals and with exercise. Advice that pre and post exercise glucose checks will be done for 3 sessions at entry of program.   Chronic Lung Diseases: - Group verbal and written instruction to review new updates, new respiratory medications, new advancements in procedures and treatments. Provide informative websites and "800" numbers of self-education.   Lung Procedures: - Group verbal and written instruction to describe testing methods done to diagnose lung disease. Review the outcome of test results. Describe the treatment choices: Pulmonary Function Tests, ABGs and oximetry.   Energy Conservation: - Provide group verbal and written instruction for methods to conserve energy, plan and organize activities. Instruct on pacing techniques, use of adaptive equipment and posture/positioning to relieve shortness of breath.   Triggers: - Group verbal and written instruction to review types of environmental controls: home humidity, furnaces, filters, dust mite/pet prevention,  HEPA vacuums. To discuss weather changes, air quality and the benefits of nasal washing.   Exacerbations: - Group verbal and written instruction to provide: warning signs, infection symptoms, calling MD promptly, preventive modes, and value of vaccinations. Review: effective airway clearance, coughing and/or vibration techniques. Create an Sports administrator.   Oxygen: - Individual and group verbal and written instruction on oxygen therapy. Includes supplement oxygen, available portable oxygen systems, continuous and intermittent flow rates, oxygen safety, concentrators, and Medicare reimbursement for oxygen.   Pulmonary Rehab from 01/28/2017 in [facility] Cardiac and Pulmonary Rehab  Date  01/13/17  Educator  LB  Instruction Review Code  2- meets goals/outcomes      Respiratory Medications: - Group verbal and written instruction to review medications for lung disease. Drug class, frequency, complications, importance of spacers, rinsing mouth after steroid MDI's, and proper cleaning methods for nebulizers.   Pulmonary Rehab from 01/28/2017 in [facility] Cardiac and Pulmonary Rehab  Date  01/13/17  Educator  LB  Instruction Review Code  2- meets goals/outcomes  AED/CPR: - Group verbal and written instruction with the use of models to demonstrate the basic use of the AED with the basic ABC's of resuscitation.   Breathing Retraining: - Provides individuals verbal and written instruction on purpose, frequency, and proper technique of diaphragmatic breathing and pursed-lipped breathing. Applies individual practice skills.   Pulmonary Rehab from 01/28/2017 in [facility] Cardiac and Pulmonary Rehab  Date  01/26/17  Educator  AS  Instruction Review Code  2- meets goals/outcomes      Anatomy and Physiology of the Lungs: - Group verbal and written instruction with the use of models to provide basic lung anatomy and physiology related to function, structure and complications of lung disease.   Heart Failure: -  Group verbal and written instruction on the basics of heart failure: signs/symptoms, treatments, explanation of ejection fraction, enlarged heart and cardiomyopathy.   Sleep Apnea: - Individual verbal and written instruction to review Obstructive Sleep Apnea. Review of risk factors, methods for diagnosing and types of masks and machines for OSA.   Anxiety: - Provides group, verbal and written instruction on the correlation between heart/lung disease and anxiety, treatment options, and management of anxiety.   Pulmonary Rehab from 01/28/2017 in [facility] Cardiac and Pulmonary Rehab  Date  01/28/17  Educator  [facility]  Instruction Review Code  2- Meets goals/outcomes      Relaxation: - Provides group, verbal and written instruction about the benefits of relaxation for patients with heart/lung disease. Also provides patients with examples of relaxation techniques.   Knowledge Questionnaire Score:     Knowledge Questionnaire Score - 01/13/17 1444      Knowledge Questionnaire Score   Pre Score 9/10       Core Components/Risk Factors/Patient Goals at Admission:     Personal Goals and Risk Factors at Admission - 01/13/17 1451      Core Components/Risk Factors/Patient Goals on Admission   Improve shortness of breath with ADL's Yes   Intervention Provide education, individualized exercise plan and daily activity instruction to help decrease symptoms of SOB with activities of daily living.   Expected Outcomes Short Term: Achieves a reduction of symptoms when performing activities of daily living.   Develop more efficient breathing techniques such as purse lipped breathing and diaphragmatic breathing; and practicing self-pacing with activity Yes   Intervention Provide education, demonstration and support about specific breathing techniuqes utilized for more efficient breathing. Include techniques such as pursed lipped breathing, diaphragmatic breathing and self-pacing activity.   Expected Outcomes  Short Term: Participant will be able to demonstrate and use breathing techniques as needed throughout daily activities.   Increase knowledge of respiratory medications and ability to use respiratory devices properly  Yes  Spiriva, Symbicort, SVN Albuterol; spacer given   Intervention Provide education and demonstration as needed of appropriate use of medications, inhalers, and oxygen therapy.   Expected Outcomes Short Term: Achieves understanding of medications use. Understands that oxygen is a medication prescribed by physician. Demonstrates appropriate use of inhaler and oxygen therapy.      Core Components/Risk Factors/Patient Goals Review:      Goals and Risk Factor Review    Row Name 01/26/17 1222             Core Components/Risk Factors/Patient Goals Review   Personal Goals Review Develop more efficient breathing techniques such as purse lipped breathing and diaphragmatic breathing and practicing self-pacing with activity.       Review Pursed lip breathing was discusses with patient and he demonstrated understanding on  how and when to use this breathing technique to help control SOB and acceptable oxygen saturations.        Expected Outcomes Patient will use PLB during exercise and ADL's to aid in improving SOB and SaO2.           Core Components/Risk Factors/Patient Goals at Discharge (Final Review):      Goals and Risk Factor Review - 01/26/17 1222      Core Components/Risk Factors/Patient Goals Review   Personal Goals Review Develop more efficient breathing techniques such as purse lipped breathing and diaphragmatic breathing and practicing self-pacing with activity.   Review Pursed lip breathing was discusses with patient and he demonstrated understanding on how and when to use this breathing technique to help control SOB and acceptable oxygen saturations.    Expected Outcomes Patient will use PLB during exercise and ADL's to aid in improving SOB and SaO2.       ITP  Comments:   Comments: 30 Day Note Review

## 2017-02-04 DIAGNOSIS — J449 Chronic obstructive pulmonary disease, unspecified: Secondary | ICD-10-CM

## 2017-02-04 NOTE — Progress Notes (Signed)
Daily Session Note  Patient Details  Name: Devin Martinez MRN: [id number] Date of Birth: 1941-01-13 Referring Provider:     Pulmonary Rehab from 01/13/2017 in [facility] Cardiac and Pulmonary Rehab  Referring Provider  [facility]      Encounter Date: 02/04/2017  Check In:     Session Check In - 02/04/17 1310      Check-In   Location [facility]-Cardiac & Pulmonary Rehab   Staff Present Carson Myrtle, BS, RRT, Respiratory Lennie Hummer, MA, ACSM RCEP, Exercise Physiologist;Amanda Oletta Darter, BA, ACSM CEP, Exercise Physiologist   Supervising physician immediately available to respond to emergencies [facility] immediately available ER MD   Physician(s) Jimmye Norman and Darl Householder   Medication changes reported     No   Fall or balance concerns reported    No   Warm-up and Cool-down Performed as group-led instruction   Resistance Training Performed Yes   VAD Patient? No     Pain Assessment   Currently in Pain? No/denies   Multiple Pain Sites No           Exercise Prescription Changes - 02/04/17 1300      Response to Exercise   Blood Pressure (Admit) 138/68   Blood Pressure (Exercise) 146/84   Blood Pressure (Exit) 120/60   Heart Rate (Admit) 73 bpm   Heart Rate (Exercise) 97 bpm   Heart Rate (Exit) 96 bpm   Oxygen Saturation (Admit) 95 %   Oxygen Saturation (Exercise) 97 %   Oxygen Saturation (Exit) 96 %   Rating of Perceived Exertion (Exercise) 13   Perceived Dyspnea (Exercise) 2     Resistance Training   Training Prescription Yes   Weight 3   Reps 10-15     Treadmill   MPH 2   Grade 0.5   Minutes 15     T5 Nustep   Level 3   SPM 60   Minutes 15   METs 2      History  Smoking Status  . Former Smoker  . Types: Cigarettes  Smokeless Tobacco  . Never Used    Goals Met:  Proper associated with RPD/PD & O2 Sat Independence with exercise equipment Exercise tolerated well Strength training completed today  Goals Unmet:  Not Applicable  Comments: Pt able to follow  exercise prescription today without complaint.  Will continue to monitor for progression.    Dr. Emily Filbert is Medical Director for [place] and [facility].

## 2017-02-06 DIAGNOSIS — J449 Chronic obstructive pulmonary disease, unspecified: Secondary | ICD-10-CM

## 2017-02-06 NOTE — Progress Notes (Signed)
Daily Session Note  Patient Details  Name: Devin Martinez MRN: [id number] Date of Birth: 1941/09/01 Referring Provider:     Pulmonary Rehab from 01/13/2017 in [facility] Cardiac and Pulmonary Rehab  Referring Provider  [facility]      Encounter Date: 02/06/2017  Check In:     Session Check In - 02/06/17 1055      Check-In   Location [facility]-Cardiac & Pulmonary Rehab   Staff Present Gerlene Burdock, RN, Vickki Hearing, BA, ACSM CEP, Exercise Physiologist;Other  Darel Hong RN   Supervising physician immediately available to respond to emergencies [facility] immediately available ER MD   Physician(s) Burlene Arnt and Schaevitz   Medication changes reported     No   Fall or balance concerns reported    No   Warm-up and Cool-down Performed as group-led Location manager Performed Yes   VAD Patient? No     Pain Assessment   Currently in Pain? No/denies         History  Smoking Status  . Former Smoker  . Types: Cigarettes  Smokeless Tobacco  . Never Used    Goals Met:  Proper associated with RPD/PD & O2 Sat Independence with exercise equipment Exercise tolerated well Strength training completed today  Goals Unmet:  Not Applicable  Comments: Pt able to follow exercise prescription today without complaint.  Will continue to monitor for progression.    Dr. Emily Filbert is Medical Director for [place] and [facility].

## 2017-02-09 ENCOUNTER — Encounter: Payer: Non-veteran care | Admitting: *Deleted

## 2017-02-09 DIAGNOSIS — J449 Chronic obstructive pulmonary disease, unspecified: Secondary | ICD-10-CM | POA: Diagnosis not present

## 2017-02-09 NOTE — Progress Notes (Signed)
Daily Session Note  Patient Details  Name: Devin Martinez MRN: [id number] Date of Birth: [birth year] Referring Provider:     Pulmonary Rehab from 01/13/2017 in [facility] Cardiac and Pulmonary Rehab  Referring Provider  [facility]      Encounter Date: 02/09/2017  Check In:     Session Check In - 02/09/17 1010      Check-In   Location [facility]-Cardiac & Pulmonary Rehab   Staff Present Nada Maclachlan, BA, ACSM CEP, Exercise Physiologist;Laureen Owens Shark, BS, RRT, Respiratory Bertis Ruddy, BS, ACSM CEP, Exercise Physiologist   Supervising physician immediately available to respond to emergencies [facility] immediately available ER MD   Physician(s) Drs. Rifenbark and Kinner   Medication changes reported     No   Fall or balance concerns reported    No   Warm-up and Cool-down Performed as group-led Location manager Performed Yes   VAD Patient? No     Pain Assessment   Currently in Pain? No/denies   Multiple Pain Sites No         History  Smoking Status  . Former Smoker  . Types: Cigarettes  Smokeless Tobacco  . Never Used    Goals Met:  Proper associated with RPD/PD & O2 Sat Independence with exercise equipment Exercise tolerated well Personal goals reviewed Strength training completed today  Goals Unmet:  Not Applicable  Comments: Pt able to follow exercise prescription today without complaint.  Will continue to monitor for progression.    Dr. Emily Filbert is Medical Director for [place] and [facility].

## 2017-02-12 DIAGNOSIS — J441 Chronic obstructive pulmonary disease with (acute) exacerbation: Secondary | ICD-10-CM | POA: Diagnosis not present

## 2017-02-16 ENCOUNTER — Encounter: Payer: Non-veteran care | Admitting: *Deleted

## 2017-02-16 DIAGNOSIS — J449 Chronic obstructive pulmonary disease, unspecified: Secondary | ICD-10-CM

## 2017-02-16 NOTE — Progress Notes (Signed)
Daily Session Note  Patient Details  Name: Devin Martinez MRN: [id number] Date of Birth: [birth year] Referring Provider:     Pulmonary Rehab from 01/13/2017 in [facility] Cardiac and Pulmonary Rehab  Referring Provider  [facility]      Encounter Date: 02/16/2017  Check In:     Session Check In - 02/16/17 1013      Check-In   Location [facility]-Cardiac & Pulmonary Rehab   Staff Present Carson Myrtle, BS, RRT, Respiratory Dareen Piano, BA, ACSM CEP, Exercise Physiologist;Jarad Barth Amedeo Plenty, BS, ACSM CEP, Exercise Physiologist   Supervising physician immediately available to respond to emergencies [facility] immediately available ER MD   Physician(s) Mariea Clonts and Alfred Levins   Medication changes reported     No   Fall or balance concerns reported    No   Warm-up and Cool-down Performed as group-led Location manager Performed Yes   VAD Patient? No     Pain Assessment   Currently in Pain? No/denies   Multiple Pain Sites No         History  Smoking Status  . Former Smoker  . Types: Cigarettes  Smokeless Tobacco  . Never Used    Goals Met:  Proper associated with RPD/PD & O2 Sat Independence with exercise equipment Exercise tolerated well Strength training completed today  Goals Unmet:  Not Applicable  Comments: Pt able to follow exercise prescription today without complaint.  Will continue to monitor for progression.    Dr. Emily Filbert is Medical Director for [place] and [facility].

## 2017-02-18 DIAGNOSIS — J449 Chronic obstructive pulmonary disease, unspecified: Secondary | ICD-10-CM | POA: Diagnosis not present

## 2017-02-18 NOTE — Progress Notes (Signed)
Daily Session Note  Patient Details  Name: KERT SHACKETT MRN: [id number] Date of Birth: October 01, 1941 Referring Provider:     Pulmonary Rehab from 01/13/2017 in [facility] Cardiac and Pulmonary Rehab  Referring Provider  [facility]      Encounter Date: 02/18/2017  Check In:     Session Check In - 02/18/17 1220      Check-In   Location [facility]-Cardiac & Pulmonary Rehab   Staff Present Carson Myrtle, BS, RRT, Respiratory Lennie Hummer, MA, ACSM RCEP, Exercise Physiologist;Amanda Oletta Darter, BA, ACSM CEP, Exercise Physiologist   Supervising physician immediately available to respond to emergencies [facility] immediately available ER MD   Physician(s) Joni Fears and Jimmye Norman   Medication changes reported     No   Fall or balance concerns reported    No   Warm-up and Cool-down Performed as group-led Location manager Performed Yes   VAD Patient? No     Pain Assessment   Currently in Pain? No/denies         History  Smoking Status  . Former Smoker  . Types: Cigarettes  Smokeless Tobacco  . Never Used    Goals Met:  Proper associated with RPD/PD & O2 Sat Independence with exercise equipment Exercise tolerated well Strength training completed today  Goals Unmet:  Not Applicable  Comments: Pt able to follow exercise prescription today without complaint.  Will continue to monitor for progression.    Dr. Emily Filbert is Medical Director for [place] and [facility].

## 2017-02-20 DIAGNOSIS — J449 Chronic obstructive pulmonary disease, unspecified: Secondary | ICD-10-CM | POA: Diagnosis not present

## 2017-02-20 NOTE — Progress Notes (Signed)
Daily Session Note  Patient Details  Name: Devin Martinez MRN: [id number] Date of Birth: 03-23-1941 Referring Provider:     Pulmonary Rehab from 01/13/2017 in [facility] Cardiac and Pulmonary Rehab  Referring Provider  [facility]      Encounter Date: 02/20/2017  Check In:     Session Check In - 02/20/17 1135      Check-In   Location [facility]-Cardiac & Pulmonary Rehab   Staff Present Alberteen Sam, MA, ACSM RCEP, Exercise Physiologist;Amanda Oletta Darter, BA, ACSM CEP, Exercise Physiologist;Mary Kellie Shropshire, RN, BSN, MA   Supervising physician immediately available to respond to emergencies [facility] immediately available ER MD   Physician(s) Tilda Franco and McShane   Medication changes reported     No   Fall or balance concerns reported    No   Warm-up and Cool-down Performed as group-led instruction   Resistance Training Performed Yes   VAD Patient? No     Pain Assessment   Currently in Pain? No/denies         History  Smoking Status  . Former Smoker  . Types: Cigarettes  Smokeless Tobacco  . Never Used    Goals Met:  Proper associated with RPD/PD & O2 Sat Independence with exercise equipment Exercise tolerated well Strength training completed today  Goals Unmet:  Not Applicable  Comments: Pt able to follow exercise prescription today without complaint.  Will continue to monitor for progression.    Dr. Emily Filbert is Medical Director for [place] and [facility].

## 2017-02-23 ENCOUNTER — Encounter: Payer: Non-veteran care | Admitting: *Deleted

## 2017-02-23 DIAGNOSIS — J449 Chronic obstructive pulmonary disease, unspecified: Secondary | ICD-10-CM

## 2017-02-23 NOTE — Progress Notes (Signed)
Daily Session Note  Patient Details  Name: Devin Martinez MRN: [id number] Date of Birth: 1941/03/12 Referring Provider:     Pulmonary Rehab from 01/13/2017 in [facility] Cardiac and Pulmonary Rehab  Referring Provider  [facility]      Encounter Date: 02/23/2017  Check In:     Session Check In - 02/23/17 1118      Check-In   Location [facility]-Cardiac & Pulmonary Rehab   Staff Present Earlean Shawl, BS, ACSM CEP, Exercise Physiologist;Laureen Owens Shark, BS, RRT, Respiratory Dareen Piano, BA, ACSM CEP, Exercise Physiologist   Supervising physician immediately available to respond to emergencies [facility] immediately available ER MD   Physician(s) Clearnce Hasten and Corky Downs   Medication changes reported     No   Fall or balance concerns reported    No   Warm-up and Cool-down Performed as group-led Location manager Performed Yes   VAD Patient? No     Pain Assessment   Currently in Pain? No/denies   Multiple Pain Sites No         History  Smoking Status  . Former Smoker  . Types: Cigarettes  Smokeless Tobacco  . Never Used    Goals Met:  Proper associated with RPD/PD & O2 Sat Independence with exercise equipment Exercise tolerated well Strength training completed today  Goals Unmet:  Not Applicable  Comments: Pt able to follow exercise prescription today without complaint.  Will continue to monitor for progression.    Dr. Emily Filbert is Medical Director for [place] and [facility].

## 2017-02-25 ENCOUNTER — Encounter: Payer: Non-veteran care | Attending: Internal Medicine

## 2017-02-25 DIAGNOSIS — J449 Chronic obstructive pulmonary disease, unspecified: Secondary | ICD-10-CM | POA: Diagnosis present

## 2017-02-25 NOTE — Progress Notes (Signed)
Daily Session Note  Patient Details  Name: Devin Martinez MRN: [id number] Date of Birth: July 30, 1941 Referring Provider:     Pulmonary Rehab from 01/13/2017 in [facility] Cardiac and Pulmonary Rehab  Referring Provider  [facility]      Encounter Date: 02/25/2017  Check In:     Session Check In - 02/25/17 1049      Check-In   Location [facility]-Cardiac & Pulmonary Rehab   Staff Present Alberteen Sam, MA, ACSM RCEP, Exercise Physiologist;Laureen Owens Shark, BS, RRT, Respiratory Dareen Piano, BA, ACSM CEP, Exercise Physiologist   Supervising physician immediately available to respond to emergencies [facility] immediately available ER MD   Physician(s) Mariea Clonts and Reita Cliche   Medication changes reported     No   Fall or balance concerns reported    No   Warm-up and Cool-down Performed as group-led Location manager Performed Yes   VAD Patient? No     Pain Assessment   Currently in Pain? No/denies         History  Smoking Status  . Former Smoker  . Types: Cigarettes  Smokeless Tobacco  . Never Used    Goals Met:  Proper associated with RPD/PD & O2 Sat Independence with exercise equipment Exercise tolerated well Strength training completed today  Goals Unmet:  Not Applicable  Comments: Pt able to follow exercise prescription today without complaint.  Will continue to monitor for progression.    Dr. Emily Filbert is Medical Director for [place] and [facility].

## 2017-02-27 ENCOUNTER — Encounter: Payer: Non-veteran care | Admitting: *Deleted

## 2017-02-27 DIAGNOSIS — J449 Chronic obstructive pulmonary disease, unspecified: Secondary | ICD-10-CM | POA: Diagnosis not present

## 2017-02-27 NOTE — Progress Notes (Signed)
Daily Session Note  Patient Details  Name: TRASON SHIFFLET MRN: [id number] Date of Birth: 16-Mar-1941 Referring Provider:     Pulmonary Rehab from 01/13/2017 in [facility] Cardiac and Pulmonary Rehab  Referring Provider  [facility]      Encounter Date: 02/27/2017  Check In:     Session Check In - 02/27/17 1111      Check-In   Location [facility]-Cardiac & Pulmonary Rehab   Staff Present Gerlene Burdock, RN, Vickki Hearing, BA, ACSM CEP, Exercise Physiologist;Other  Darel Hong, RN   Supervising physician immediately available to respond to emergencies [facility] immediately available ER MD   Physician(s) Clearnce Hasten and Corky Downs   Medication changes reported     No   Fall or balance concerns reported    No   Tobacco Cessation No Change   Warm-up and Cool-down Performed as group-led instruction   Resistance Training Performed Yes   VAD Patient? No     Pain Assessment   Currently in Pain? No/denies         History  Smoking Status  . Former Smoker  . Types: Cigarettes  Smokeless Tobacco  . Never Used    Goals Met:  Proper associated with RPD/PD & O2 Sat Exercise tolerated well  Goals Unmet:  Not Applicable  Comments:     Dr. Emily Filbert is Medical Director for [place] and [facility].

## 2017-03-02 ENCOUNTER — Encounter: Payer: Self-pay | Admitting: Respiratory Therapy

## 2017-03-02 ENCOUNTER — Encounter: Payer: Non-veteran care | Admitting: *Deleted

## 2017-03-02 DIAGNOSIS — J449 Chronic obstructive pulmonary disease, unspecified: Secondary | ICD-10-CM

## 2017-03-02 NOTE — Progress Notes (Signed)
Daily Session Note  Patient Details  Name: PATRIC BUCKHALTER MRN: [id number] Date of Birth: 1941/07/31 Referring Provider:     Pulmonary Rehab from 01/13/2017 in [facility] Cardiac and Pulmonary Rehab  Referring Provider  [facility]      Encounter Date: 03/02/2017  Check In:     Session Check In - 03/02/17 1309      Check-In   Location [facility]-Cardiac & Pulmonary Rehab   Staff Present Nada Maclachlan, BA, ACSM CEP, Exercise Physiologist;Laureen Owens Shark, BS, RRT, Respiratory Bertis Ruddy, BS, ACSM CEP, Exercise Physiologist   Supervising physician immediately available to respond to emergencies [facility] immediately available ER MD   Physician(s) Burlene Arnt and Jimmye Norman   Medication changes reported     No   Fall or balance concerns reported    No   Warm-up and Cool-down Performed as group-led instruction   Resistance Training Performed Yes   VAD Patient? No     Pain Assessment   Currently in Pain? No/denies   Multiple Pain Sites No         History  Smoking Status  . Former Smoker  . Types: Cigarettes  Smokeless Tobacco  . Never Used    Goals Met:  Proper associated with RPD/PD & O2 Sat Independence with exercise equipment Exercise tolerated well Strength training completed today  Goals Unmet:  Not Applicable  Comments: Pt able to follow exercise prescription today without complaint.  Will continue to monitor for progression.    Dr. Emily Filbert is Medical Director for [place] and [facility].

## 2017-03-02 NOTE — Progress Notes (Signed)
Pulmonary Individual Treatment Plan  Patient Details  Name: Devin Martinez MRN: [id number] Date of Birth: [birth year] Referring Provider:     Pulmonary Rehab from 01/13/2017 in [facility] Cardiac and Pulmonary Rehab  Referring Provider  [place]      Initial Encounter Date:    Pulmonary Rehab from 01/13/2017 in [facility] Cardiac and Pulmonary Rehab  Date  01/13/17  Referring Provider  [facility]      Visit Diagnosis: Chronic obstructive pulmonary disease, unspecified COPD type ([place])  Patient's Home Medications on Admission:  Current Outpatient Prescriptions:    aspirin 81 MG tablet, Take 81 mg by mouth daily. Reported on 04/25/2016, Disp: , Rfl:    budesonide-formoterol (SYMBICORT) 160-4.5 MCG/ACT inhaler, Inhale 2 puffs into the lungs 2 (two) times daily., Disp: , Rfl:    enalapril-hydrochlorothiazide (VASERETIC) 10-25 MG per tablet, Take 1 tablet by mouth every other day. , Disp: , Rfl:    feeding supplement, ENSURE ENLIVE, (ENSURE ENLIVE) LIQD, Take 237 mLs by mouth 2 (two) times daily between meals., Disp: 237 mL, Rfl: 12   ipratropium-albuterol (DUONEB) 0.5-2.5 (3) MG/3ML SOLN, Take 3 mLs by nebulization every 6 (six) hours., Disp: 30 mL, Rfl: 1   Multiple Vitamin (MULTIVITAMIN) tablet, Take 1 tablet by mouth daily. Reported on 04/25/2016, Disp: , Rfl:    OVER THE COUNTER MEDICATION, Take 1 tablet by mouth daily as needed. Reported on 04/25/2016, Disp: , Rfl:    Red Yeast Rice Extract (RED YEAST RICE PO), Take 1 tablet by mouth daily.  , Disp: , Rfl:   Past Medical History: Past Medical History:  Diagnosis Date   Asthma    COPD (chronic obstructive pulmonary disease) (HCC)    Degenerative arthritis of left knee    Diabetes mellitus    Type II   Herpes    Hyperlipidemia    Hypertension    LVH (left ventricular hypertrophy)    Hx. of   Obesity    Smoker     Tobacco Use: History  Smoking Status   Former Smoker   Types: Cigarettes  Smokeless Tobacco   Never Used     Labs: Recent Review [facility] manager for [facility] Cardiac and Pulmonary Rehab Latest Ref Rng & Units 03/16/2016 03/17/2016   Hemoglobin A1c 4.0 - 6.0 % - 5.8   PHART 7.350 - 7.450 7.40 -   PCO2ART 32.0 - 48.0 mmHg 45 -   HCO3 21.0 - 28.0 mEq/L 27.9 -   O2SAT % 91.4 -       ADL UCSD:     Pulmonary Assessment Scores    Row Name 01/13/17 1446         ADL UCSD   ADL Phase Entry     SOB Score total 24     Rest 0     Walk 1     Stairs 2     Bath 3     Dress 1     Shop 1        Pulmonary Function Assessment:     Pulmonary Function Assessment - 01/13/17 1445      Pulmonary Function Tests   RV% 108 %   DLCO% 55 %     Initial Spirometry Results   FVC% 52 %   FEV1% 33 %   FEV1/FVC Ratio 47   Comments Test Date 05/16/16     Post Bronchodilator Spirometry Results   FVC% 53 %   FEV1% 36 %   FEV1/FVC Ratio 49  Breath   Bilateral Breath Sounds Decreased;Wheezes;Expiratory   Shortness of Breath Yes;Limiting activity      Exercise Target Goals:    Exercise Program Goal: Individual exercise prescription set with THRR, safety & activity barriers. Participant demonstrates ability to understand and report RPE using BORG scale, to self-measure pulse accurately, and to acknowledge the importance of the exercise prescription.  Exercise Prescription Goal: Starting with aerobic activity 30 plus minutes a day, 3 days per week for initial exercise prescription. Provide home exercise prescription and guidelines that participant acknowledges understanding prior to discharge.  Activity Barriers & Risk Stratification:   6 Minute Walk:     6 Minute Walk    Row Name 01/13/17 1425         6 Minute Walk   Distance 1214 feet     Walk Time 6 minutes     # of Rest Breaks 0     MPH 2.29     METS 2.27     RPE 12     Perceived Dyspnea  2     VO2 Peak 7.95     Symptoms No     Resting HR 71 bpm     Resting BP 128/62     Max Ex. HR 97 bpm     Max Ex. BP 126/58        Interval HR   Baseline HR 71     1 Minute HR 81     2 Minute HR 90     3 Minute HR 95     4 Minute HR 96     5 Minute HR 97     6 Minute HR 96     Interval Heart Rate? Yes       Interval Oxygen   Interval Oxygen? Yes     Baseline Oxygen Saturation % 94 %     1 Minute Oxygen Saturation % 93 %     2 Minute Oxygen Saturation % 90 %     3 Minute Oxygen Saturation % 91 %     4 Minute Oxygen Saturation % 92 %     5 Minute Oxygen Saturation % 92 %     6 Minute Oxygen Saturation % 93 %       Oxygen Initial Assessment:     Oxygen Initial Assessment - 01/13/17 1448      Home Oxygen   Home Oxygen Device Portable Concentrator;Home Concentrator   Sleep Oxygen Prescription Continuous   Liters per minute 1.5  as needed   Home Exercise Oxygen Prescription Continuous   Liters per minute 1.5  as needed   Home at Rest Exercise Oxygen Prescription Continuous   Liters per minute 1.5  as needed     Initial 6 min Walk   Oxygen Used None     Program Oxygen Prescription   Program Oxygen Prescription Continuous;E-Tanks   Liters per minute 1.5   Comments as needed     Intervention   Short Term Goals To learn and exhibit compliance with exercise, home and travel O2 prescription;To learn and understand importance of monitoring SPO2 with pulse oximeter and demonstrate accurate use of the pulse oximeter.;To Learn and understand importance of maintaining oxygen saturations>88%;To learn and demonstrate proper purse lipped breathing techniques or other breathing techniques.;To learn and demonstrate proper use of respiratory medications   Long  Term Goals Exhibits compliance with exercise, home and travel O2 prescription;Verbalizes importance of monitoring SPO2 with pulse oximeter and return demonstration;Maintenance of O2 saturations>88%;Exhibits  proper breathing techniques, such as purse lipped breathing or other method taught during program session;Compliance with respiratory  medication;Demonstrates proper use of MDIs      Oxygen Re-Evaluation:   Oxygen Discharge (Final Oxygen Re-Evaluation):   Initial Exercise Prescription:     Initial Exercise Prescription - 01/13/17 1400      Date of Initial Exercise RX and Referring Provider   Date 01/13/17   Referring Provider [facility]     Treadmill   MPH 2   Grade 0.5   Minutes 15     Bike   Level 1   Minutes 15   METs 2     T5 Nustep   Level 2   SPM 60   Minutes 15   METs 2     Prescription Details   Frequency (times per week) 3   Duration Progress to 45 minutes of aerobic exercise without signs/symptoms of physical distress     Intensity   THRR 40-80% of Max Heartrate 99-128   Ratings of Perceived Exertion 11-15   Perceived Dyspnea 0-4     Resistance Training   Training Prescription Yes   Weight 3   Reps 10-15      Perform Capillary Blood Glucose checks as needed.  Exercise Prescription Changes:     Exercise Prescription Changes    Row Name 01/26/17 1200 02/04/17 1300 02/18/17 1200         Response to Exercise   Blood Pressure (Admit) 128/70 138/68 124/64     Blood Pressure (Exercise) 154/82 146/84 126/66     Blood Pressure (Exit) 112/70 120/60 126/66     Heart Rate (Admit) 83 bpm 73 bpm 80 bpm     Heart Rate (Exercise) 107 bpm 97 bpm 104 bpm     Heart Rate (Exit) 97 bpm 96 bpm 72 bpm     Oxygen Saturation (Admit) 95 % 95 % 100 %     Oxygen Saturation (Exercise) 94 % 97 % 96 %     Oxygen Saturation (Exit) 96 % 96 % 93 %     Rating of Perceived Exertion (Exercise) '13 13 12     '$ Perceived Dyspnea (Exercise) '3 2 2     '$ Duration  --  -- Progress to 45 minutes of aerobic exercise without signs/symptoms of physical distress     Intensity  --  -- THRR unchanged       Progression   Progression  --  -- Continue to progress workloads to maintain intensity without signs/symptoms of physical distress.       Resistance Training   Training Prescription Yes Yes Yes     Weight '3 3 3      '$ Reps 10-15 10-15 10-15       Treadmill   MPH 2 2 2.1     Grade 0.5 0.5 0.5     Minutes '15 15 15       '$ Bike   Level 1  --  --     Minutes 15  --  --     METs 2  --  --       T5 Nustep   Level '2 3 3     '$ SPM 60 60 108     Minutes '15 15 15     '$ METs 2 2 2.4        Exercise Comments:     Exercise Comments    Row Name 01/26/17 1226 02/04/17 1331 02/18/17 1225       Exercise  Comments  First full day of exercise!  Patient was oriented to gym and equipment including functions, settings, policies, and procedures.  Patient's individual exercise prescription and treatment plan were reviewed.  All starting workloads were established based on the results of the 6 minute walk test done at initial orientation visit.  The plan for exercise progression was also introduced and progression will be customized based on patient's performance and goals Shone is tolerating exercise well. Zakar is doing well keeping his heart rate in proper training range.        Exercise Goals and Review:   Exercise Goals Re-Evaluation :     Exercise Goals Re-Evaluation    Row Name 02/09/17 1225             Exercise Goal Re-Evaluation   Exercise Goals Review Increase Strenth and Stamina       Comments Patient reports that his legs are starting to feel stronger. Home exercise was reviewed with patient and he plans to investigate a possible gym membership at a community facility.        Expected Outcomes Patient will investigate area gym memberships.           Discharge Exercise Prescription (Final Exercise Prescription Changes):     Exercise Prescription Changes - 02/18/17 1200      Response to Exercise   Blood Pressure (Admit) 124/64   Blood Pressure (Exercise) 126/66   Blood Pressure (Exit) 126/66   Heart Rate (Admit) 80 bpm   Heart Rate (Exercise) 104 bpm   Heart Rate (Exit) 72 bpm   Oxygen Saturation (Admit) 100 %   Oxygen Saturation (Exercise) 96 %   Oxygen Saturation (Exit) 93 %   Rating of  Perceived Exertion (Exercise) 12   Perceived Dyspnea (Exercise) 2   Duration Progress to 45 minutes of aerobic exercise without signs/symptoms of physical distress   Intensity THRR unchanged     Progression   Progression Continue to progress workloads to maintain intensity without signs/symptoms of physical distress.     Resistance Training   Training Prescription Yes   Weight 3   Reps 10-15     Treadmill   MPH 2.1   Grade 0.5   Minutes 15     T5 Nustep   Level 3   SPM 108   Minutes 15   METs 2.4      Nutrition:  Target Goals: Understanding of nutrition guidelines, daily intake of sodium '1500mg'$ , cholesterol '200mg'$ , calories 30% from fat and 7% or less from saturated fats, daily to have 5 or more servings of fruits and vegetables.  Biometrics:     Pre Biometrics - 01/13/17 1425      Pre Biometrics   Height '5\' 11"'$  (1.803 m)   Weight 207 lb 9.6 oz (94.2 kg)   Waist Circumference 46 inches   Hip Circumference 44 inches   Waist to Hip Ratio 1.05 %   BMI (Calculated) 29       Nutrition Therapy Plan and Nutrition Goals:   Nutrition Discharge: Rate Your Plate Scores:   Nutrition Goals Re-Evaluation:   Nutrition Goals Discharge (Final Nutrition Goals Re-Evaluation):   Psychosocial: Target Goals: Acknowledge presence or absence of significant depression and/or stress, maximize coping skills, provide positive support system. Participant is able to verbalize types and ability to use techniques and skills needed for reducing stress and depression.   Initial Review & Psychosocial Screening:     Initial Psych Review & Screening - 01/13/17 1452  Family Dynamics   Good Support System? Yes   Comments Mr Lina has good support from his daughter, grandchildren, and his church family. He is looking forward to participating in [place].     Barriers   Psychosocial barriers to participate in program There are no identifiable barriers or psychosocial needs.;The  patient should benefit from training in stress management and relaxation.     Screening Interventions   Interventions Encouraged to exercise      Quality of Life Scores:     Quality of Life - 01/13/17 1453      Quality of Life Scores   Health/Function Pre 20.38 %   Socioeconomic Pre 20 %   Psych/Spiritual Pre 20.29 %   Family Pre 20.6 %   GLOBAL Pre 20.32 %      PHQ-9: Recent Review Flowsheet Data    Depression screen [facility] 2/9 01/13/2017 04/25/2016   Decreased Interest 0 0   Down, Depressed, Hopeless 0 0   PHQ - 2 Score 0 0   Altered sleeping 0 -   Tired, decreased energy 1 -   Change in appetite 0 -   Feeling bad or failure about yourself  1 -   Trouble concentrating 0 -   Moving slowly or fidgety/restless 0 -   Suicidal thoughts 0 -   PHQ-9 Score 2 -     Interpretation of Total Score  Total Score Depression Severity:  1-4 = Minimal depression, 5-9 = Mild depression, 10-14 = Moderate depression, 15-19 = Moderately severe depression, 20-27 = Severe depression   Psychosocial Evaluation and Intervention:     Psychosocial Evaluation - 01/28/17 1053      Psychosocial Evaluation & Interventions   Interventions Encouraged to exercise with the program and follow exercise prescription;Stress management education;Relaxation education   Comments Counselor met with Mr. Vester [facility]) today for intitial psychosocial evaluation.   He is a 76 yr old (48 later this month).  He has a diagnosis of COPD and was referred to this program by the [place].  Mikhi lives alone but has family & friends close by; and is actively involved in his local church.  He reports sleeping well; having a good appetite; and is in a positive mood most of the time.  He denies a history of depression or anxiety or any current symptoms.  He admits that his family and his health are his primary stressors currently.  Woodley has goals to be more flexible in his body and to breathe better while in this program.  Staff will be  following with him throughout the course of this program.     Expected Outcomes Masoud will exercise consistently to accomplish his goals for this program.  He will also attend and participate in the psychoeducational components of this program to help with managing his stress in a positive way.  Staff will follow with [place].   Continue Psychosocial Services  Follow up required by staff      Psychosocial Re-Evaluation:   Psychosocial Discharge (Final Psychosocial Re-Evaluation):   Education: Education Goals: Education classes will be provided on a weekly basis, covering required topics. Participant will state understanding/return demonstration of topics presented.  Learning Barriers/Preferences:     Learning Barriers/Preferences - 01/13/17 1444      Learning Barriers/Preferences   Learning Barriers None   Learning Preferences None      Education Topics: Initial Evaluation Education: - Verbal, written and demonstration of respiratory meds, RPE/PD scales, oximetry and breathing techniques. Instruction on use of  nebulizers and MDIs: cleaning and proper use, rinsing mouth with steroid doses and importance of monitoring MDI activations.   Pulmonary Rehab from 02/25/2017 in [facility] Cardiac and Pulmonary Rehab  Date  01/13/17  Educator  LB  Instruction Review Code  2- meets goals/outcomes      General Nutrition Guidelines/Fats and Fiber: -Group instruction provided by verbal, written material, models and posters to present the general guidelines for heart healthy nutrition. Gives an explanation and review of dietary fats and fiber.   Pulmonary Rehab from 02/25/2017 in [facility] Cardiac and Pulmonary Rehab  Date  02/23/17  Educator  CR  Instruction Review Code  2- meets goals/outcomes      Controlling Sodium/Reading Food Labels: -Group verbal and written material supporting the discussion of sodium use in heart healthy nutrition. Review and explanation with models, verbal and written materials  for utilization of the food label.   Exercise Physiology & Risk Factors: - Group verbal and written instruction with models to review the exercise physiology of the cardiovascular system and associated critical values. Details cardiovascular disease risk factors and the goals associated with each risk factor.   Aerobic Exercise & Resistance Training: - Gives group verbal and written discussion on the health impact of inactivity. On the components of aerobic and resistive training programs and the benefits of this training and how to safely progress through these programs.   Flexibility, Balance, General Exercise Guidelines: - Provides group verbal and written instruction on the benefits of flexibility and balance training programs. Provides general exercise guidelines with specific guidelines to those with heart or lung disease. Demonstration and skill practice provided.   Stress Management: - Provides group verbal and written instruction about the health risks of elevated stress, cause of high stress, and healthy ways to reduce stress.   Depression: - Provides group verbal and written instruction on the correlation between heart/lung disease and depressed mood, treatment options, and the stigmas associated with seeking treatment.   Exercise & Equipment Safety: - Individual verbal instruction and demonstration of equipment use and safety with use of the equipment.   Pulmonary Rehab from 02/25/2017 in [facility] Cardiac and Pulmonary Rehab  Date  01/26/17  Educator  AS  Instruction Review Code  2- meets goals/outcomes      Infection Prevention: - Provides verbal and written material to individual with discussion of infection control including proper hand washing and proper equipment cleaning during exercise session.   Pulmonary Rehab from 02/25/2017 in [facility] Cardiac and Pulmonary Rehab  Date  01/26/17  Educator  AS  Instruction Review Code  2- meets goals/outcomes      Falls  Prevention: - Provides verbal and written material to individual with discussion of falls prevention and safety.   Pulmonary Rehab from 02/25/2017 in [facility] Cardiac and Pulmonary Rehab  Date  01/13/17  Educator  LB  Instruction Review Code  2- meets goals/outcomes      Diabetes: - Individual verbal and written instruction to review signs/symptoms of diabetes, desired ranges of glucose level fasting, after meals and with exercise. Advice that pre and post exercise glucose checks will be done for 3 sessions at entry of program.   Chronic Lung Diseases: - Group verbal and written instruction to review new updates, new respiratory medications, new advancements in procedures and treatments. Provide informative websites and "800" numbers of self-education.   Pulmonary Rehab from 02/25/2017 in [facility] Cardiac and Pulmonary Rehab  Date  02/04/17  Educator  LB  Instruction Review Code  2- meets  goals/outcomes      Lung Procedures: - Group verbal and written instruction to describe testing methods done to diagnose lung disease. Review the outcome of test results. Describe the treatment choices: Pulmonary Function Tests, ABGs and oximetry.   Energy Conservation: - Provide group verbal and written instruction for methods to conserve energy, plan and organize activities. Instruct on pacing techniques, use of adaptive equipment and posture/positioning to relieve shortness of breath.   Triggers: - Group verbal and written instruction to review types of environmental controls: home humidity, furnaces, filters, dust mite/pet prevention, HEPA vacuums. To discuss weather changes, air quality and the benefits of nasal washing.   Exacerbations: - Group verbal and written instruction to provide: warning signs, infection symptoms, calling MD promptly, preventive modes, and value of vaccinations. Review: effective airway clearance, coughing and/or vibration techniques. Create an Sports administrator.   Oxygen: -  Individual and group verbal and written instruction on oxygen therapy. Includes supplement oxygen, available portable oxygen systems, continuous and intermittent flow rates, oxygen safety, concentrators, and Medicare reimbursement for oxygen.   Pulmonary Rehab from 02/25/2017 in [facility] Cardiac and Pulmonary Rehab  Date  01/13/17  Educator  LB  Instruction Review Code  2- meets goals/outcomes      Respiratory Medications: - Group verbal and written instruction to review medications for lung disease. Drug class, frequency, complications, importance of spacers, rinsing mouth after steroid MDI's, and proper cleaning methods for nebulizers.   Pulmonary Rehab from 02/25/2017 in [facility] Cardiac and Pulmonary Rehab  Date  01/13/17  Educator  LB  Instruction Review Code  2- meets goals/outcomes      AED/CPR: - Group verbal and written instruction with the use of models to demonstrate the basic use of the AED with the basic ABC's of resuscitation.   Pulmonary Rehab from 02/25/2017 in [facility] Cardiac and Pulmonary Rehab  Date  02/20/17  Educator  MA  Instruction Review Code  2- meets goals/outcomes      Breathing Retraining: - Provides individuals verbal and written instruction on purpose, frequency, and proper technique of diaphragmatic breathing and pursed-lipped breathing. Applies individual practice skills.   Pulmonary Rehab from 02/25/2017 in [facility] Cardiac and Pulmonary Rehab  Date  01/26/17  Educator  AS  Instruction Review Code  2- meets goals/outcomes      Anatomy and Physiology of the Lungs: - Group verbal and written instruction with the use of models to provide basic lung anatomy and physiology related to function, structure and complications of lung disease.   Heart Failure: - Group verbal and written instruction on the basics of heart failure: signs/symptoms, treatments, explanation of ejection fraction, enlarged heart and cardiomyopathy.   Sleep Apnea: - Individual verbal and written  instruction to review Obstructive Sleep Apnea. Review of risk factors, methods for diagnosing and types of masks and machines for OSA.   Anxiety: - Provides group, verbal and written instruction on the correlation between heart/lung disease and anxiety, treatment options, and management of anxiety.   Pulmonary Rehab from 02/25/2017 in [facility] Cardiac and Pulmonary Rehab  Date  01/28/17  Educator  [facility]  Instruction Review Code  2- Meets goals/outcomes      Relaxation: - Provides group, verbal and written instruction about the benefits of relaxation for patients with heart/lung disease. Also provides patients with examples of relaxation techniques.   Pulmonary Rehab from 02/25/2017 in [facility] Cardiac and Pulmonary Rehab  Date  02/25/17  Educator  [facility]  Instruction Review Code  2- Meets goals/outcomes  Knowledge Questionnaire Score:     Knowledge Questionnaire Score - 01/13/17 1444      Knowledge Questionnaire Score   Pre Score 9/10       Core Components/Risk Factors/Patient Goals at Admission:     Personal Goals and Risk Factors at Admission - 01/13/17 1451      Core Components/Risk Factors/Patient Goals on Admission   Improve shortness of breath with ADL's Yes   Intervention Provide education, individualized exercise plan and daily activity instruction to help decrease symptoms of SOB with activities of daily living.   Expected Outcomes Short Term: Achieves a reduction of symptoms when performing activities of daily living.   Develop more efficient breathing techniques such as purse lipped breathing and diaphragmatic breathing; and practicing self-pacing with activity Yes   Intervention Provide education, demonstration and support about specific breathing techniuqes utilized for more efficient breathing. Include techniques such as pursed lipped breathing, diaphragmatic breathing and self-pacing activity.   Expected Outcomes Short Term: Participant will be able to demonstrate and use  breathing techniques as needed throughout daily activities.   Increase knowledge of respiratory medications and ability to use respiratory devices properly  Yes  Spiriva, Symbicort, SVN Albuterol; spacer given   Intervention Provide education and demonstration as needed of appropriate use of medications, inhalers, and oxygen therapy.   Expected Outcomes Short Term: Achieves understanding of medications use. Understands that oxygen is a medication prescribed by physician. Demonstrates appropriate use of inhaler and oxygen therapy.      Core Components/Risk Factors/Patient Goals Review:      Goals and Risk Factor Review    Row Name 01/26/17 1222 02/09/17 1227           Core Components/Risk Factors/Patient Goals Review   Personal Goals Review Develop more efficient breathing techniques such as purse lipped breathing and diaphragmatic breathing and practicing self-pacing with activity. Improve shortness of breath with ADL's;Develop more efficient breathing techniques such as purse lipped breathing and diaphragmatic breathing and practicing self-pacing with activity.      Review Pursed lip breathing was discusses with patient and he demonstrated understanding on how and when to use this breathing technique to help control SOB and acceptable oxygen saturations.  Patient reported that he uses PLB when needed to help control SOB during ADL's and exercise.       Expected Outcomes Patient will use PLB during exercise and ADL's to aid in improving SOB and SaO2.  Patient will continue to attend class regularly and use PLB as needed.          Core Components/Risk Factors/Patient Goals at Discharge (Final Review):      Goals and Risk Factor Review - 02/09/17 1227      Core Components/Risk Factors/Patient Goals Review   Personal Goals Review Improve shortness of breath with ADL's;Develop more efficient breathing techniques such as purse lipped breathing and diaphragmatic breathing and practicing  self-pacing with activity.   Review Patient reported that he uses PLB when needed to help control SOB during ADL's and exercise.    Expected Outcomes Patient will continue to attend class regularly and use PLB as needed.       ITP Comments:     ITP Comments    Row Name 03/02/17 0758           ITP Comments  30 day note review by Dr Bethann Punches, Medical Director of [facility]          Comments:  30 day note review by Dr Bethann Punches,  Medical Director of [facility]

## 2017-03-04 DIAGNOSIS — J449 Chronic obstructive pulmonary disease, unspecified: Secondary | ICD-10-CM

## 2017-03-04 NOTE — Progress Notes (Signed)
Daily Session Note  Patient Details  Name: JUVENAL UMAR MRN: [id number] Date of Birth: 01/03/1941 Referring Provider:     Pulmonary Rehab from 01/13/2017 in [facility] Cardiac and Pulmonary Rehab  Referring Provider  [facility]      Encounter Date: 03/04/2017  Check In:     Session Check In - 03/04/17 1311      Check-In   Location [facility]-Cardiac & Pulmonary Rehab   Staff Present Carson Myrtle, BS, RRT, Respiratory Lennie Hummer, MA, ACSM RCEP, Exercise Physiologist;Amanda Oletta Darter, BA, ACSM CEP, Exercise Physiologist   Supervising physician immediately available to respond to emergencies [facility] immediately available ER MD   Physician(s) Alfred Levins and Jimmye Norman   Medication changes reported     No   Fall or balance concerns reported    No   Warm-up and Cool-down Performed as group-led instruction   Resistance Training Performed Yes   VAD Patient? No     Pain Assessment   Currently in Pain? No/denies           Exercise Prescription Changes - 03/04/17 1300      Response to Exercise   Blood Pressure (Admit) 138/64   Blood Pressure (Exercise) 134/60   Blood Pressure (Exit) 122/58   Heart Rate (Admit) 92 bpm   Heart Rate (Exercise) 125 bpm   Heart Rate (Exit) 99 bpm   Oxygen Saturation (Admit) 96 %   Oxygen Saturation (Exercise) 90 %   Oxygen Saturation (Exit) 94 %   Rating of Perceived Exertion (Exercise) 13   Perceived Dyspnea (Exercise) 3   Duration Progress to 45 minutes of aerobic exercise without signs/symptoms of physical distress   Intensity THRR unchanged     Progression   Progression Continue to progress workloads to maintain intensity without signs/symptoms of physical distress.     Resistance Training   Training Prescription Yes   Weight 4   Reps 10-15     Treadmill   MPH 2.1   Grade 0.5   Minutes 15     REL-XR   Level 3   Minutes 15   METs 4.1     T5 Nustep   Level 3   SPM 96   Minutes 15   METs 2.2      History  Smoking Status  .  Former Smoker  . Types: Cigarettes  Smokeless Tobacco  . Never Used    Goals Met:  Proper associated with RPD/PD & O2 Sat Independence with exercise equipment Exercise tolerated well Strength training completed today  Goals Unmet:  Not Applicable  Comments:      6 Minute Walk    Row Name 01/13/17 1425         6 Minute Walk   Distance 1214 feet     Walk Time 6 minutes     # of Rest Breaks 0     MPH 2.29     METS 2.27     RPE 12     Perceived Dyspnea  2     VO2 Peak 7.95     Symptoms No     Resting HR 71 bpm     Resting BP 128/62     Max Ex. HR 97 bpm     Max Ex. BP 126/58       Interval HR   Baseline HR 71     1 Minute HR 81     2 Minute HR 90     3 Minute HR 95     4 Minute  HR 96     5 Minute HR 97     6 Minute HR 96     Interval Heart Rate? Yes       Interval Oxygen   Interval Oxygen? Yes     Baseline Oxygen Saturation % 94 %     1 Minute Oxygen Saturation % 93 %     2 Minute Oxygen Saturation % 90 %     3 Minute Oxygen Saturation % 91 %     4 Minute Oxygen Saturation % 92 %     5 Minute Oxygen Saturation % 92 %     6 Minute Oxygen Saturation % 93 %          Dr. Emily Filbert is Medical Director for [place] and [facility].

## 2017-03-06 ENCOUNTER — Encounter: Payer: Non-veteran care | Admitting: *Deleted

## 2017-03-06 DIAGNOSIS — J449 Chronic obstructive pulmonary disease, unspecified: Secondary | ICD-10-CM

## 2017-03-06 NOTE — Progress Notes (Signed)
Daily Session Note  Patient Details  Name: Devin Martinez MRN: [id number] Date of Birth: 07/26/1941 Referring Provider:     Pulmonary Rehab from 01/13/2017 in [facility] Cardiac and Pulmonary Rehab  Referring Provider  [facility]      Encounter Date: 03/06/2017  Check In:     Session Check In - 03/06/17 1036      Check-In   Location [facility]-Cardiac & Pulmonary Rehab   Staff Present Gerlene Burdock, RN, Vickki Hearing, BA, ACSM CEP, Exercise Physiologist;Susanne Bice, RN, BSN, CCRP   Supervising physician immediately available to respond to emergencies [facility] immediately available ER MD   Physician(s) Dr. Jacqualine Code and Dr. Alfred Levins   Medication changes reported     No   Fall or balance concerns reported    No   Tobacco Cessation No Change   Warm-up and Cool-down Performed as group-led instruction   Resistance Training Performed Yes   VAD Patient? No     Pain Assessment   Currently in Pain? No/denies         History  Smoking Status  . Former Smoker  . Types: Cigarettes  Smokeless Tobacco  . Never Used    Goals Met:  Proper associated with RPD/PD & O2 Sat Exercise tolerated well  Goals Unmet:  Not Applicable  Comments:     Dr. Emily Filbert is Medical Director for [place] and [facility].

## 2017-03-09 ENCOUNTER — Encounter: Payer: Non-veteran care | Admitting: *Deleted

## 2017-03-09 DIAGNOSIS — R7303 Prediabetes: Secondary | ICD-10-CM | POA: Diagnosis not present

## 2017-03-09 DIAGNOSIS — I10 Essential (primary) hypertension: Secondary | ICD-10-CM | POA: Diagnosis not present

## 2017-03-09 DIAGNOSIS — J449 Chronic obstructive pulmonary disease, unspecified: Secondary | ICD-10-CM

## 2017-03-09 DIAGNOSIS — E782 Mixed hyperlipidemia: Secondary | ICD-10-CM | POA: Diagnosis not present

## 2017-03-09 NOTE — Progress Notes (Signed)
Daily Session Note  Patient Details  Name: Devin Martinez MRN: [id number] Date of Birth: [birth year] Referring Provider:     Pulmonary Rehab from 01/13/2017 in [facility] Cardiac and Pulmonary Rehab  Referring Provider  [facility]      Encounter Date: 03/09/2017  Check In:     Session Check In - 03/09/17 1036      Check-In   Location [facility]-Cardiac & Pulmonary Rehab   Staff Present Earlean Shawl, BS, ACSM CEP, Exercise Physiologist;Laureen Owens Shark, BS, RRT, Respiratory Dareen Piano, BA, ACSM CEP, Exercise Physiologist   Supervising physician immediately available to respond to emergencies [facility] immediately available ER MD   Physician(s) Alfred Levins and Corky Downs   Medication changes reported     No   Fall or balance concerns reported    No   Warm-up and Cool-down Performed as group-led Location manager Performed Yes   VAD Patient? No     Pain Assessment   Currently in Pain? No/denies   Multiple Pain Sites No         History  Smoking Status  . Former Smoker  . Types: Cigarettes  Smokeless Tobacco  . Never Used    Goals Met:  Proper associated with RPD/PD & O2 Sat Independence with exercise equipment Exercise tolerated well Strength training completed today  Goals Unmet:  Not Applicable  Comments: Pt able to follow exercise prescription today without complaint.  Will continue to monitor for progression.    Dr. Emily Filbert is Medical Director for [place] and [facility].

## 2017-03-12 DIAGNOSIS — I10 Essential (primary) hypertension: Secondary | ICD-10-CM | POA: Diagnosis not present

## 2017-03-12 DIAGNOSIS — E782 Mixed hyperlipidemia: Secondary | ICD-10-CM | POA: Diagnosis not present

## 2017-03-12 DIAGNOSIS — R7303 Prediabetes: Secondary | ICD-10-CM | POA: Diagnosis not present

## 2017-03-13 DIAGNOSIS — J449 Chronic obstructive pulmonary disease, unspecified: Secondary | ICD-10-CM

## 2017-03-13 NOTE — Progress Notes (Signed)
Daily Session Note  Patient Details  Name: Devin Martinez MRN: [id number] Date of Birth: Mar 20, 1941 Referring Provider:     Pulmonary Rehab from 01/13/2017 in [facility] Cardiac and Pulmonary Rehab  Referring Provider  [facility]      Encounter Date: 03/13/2017  Check In:     Session Check In - 03/13/17 1131      Check-In   Location [facility]-Cardiac & Pulmonary Rehab   Staff Present Heath Lark, RN, BSN, CCRP;Carroll Enterkin, RN, Vickki Hearing, BA, ACSM CEP, Exercise Physiologist   Supervising physician immediately available to respond to emergencies [facility] immediately available ER MD   Physician(s) Reita Cliche and Event organiser   Medication changes reported     No   Fall or balance concerns reported    No   Warm-up and Cool-down Performed as group-led Location manager Performed Yes   VAD Patient? No     Pain Assessment   Currently in Pain? No/denies         History  Smoking Status  . Former Smoker  . Types: Cigarettes  Smokeless Tobacco  . Never Used    Goals Met:  Proper associated with RPD/PD & O2 Sat Independence with exercise equipment Exercise tolerated well Strength training completed today  Goals Unmet: Not applicable   Comments: Pt able to follow exercise prescription today without complaint.  Will continue to monitor for progression.    Dr. Emily Filbert is Medical Director for [place] and [facility].

## 2017-03-14 DIAGNOSIS — J441 Chronic obstructive pulmonary disease with (acute) exacerbation: Secondary | ICD-10-CM | POA: Diagnosis not present

## 2017-03-16 ENCOUNTER — Encounter: Payer: Non-veteran care | Admitting: *Deleted

## 2017-03-16 DIAGNOSIS — J449 Chronic obstructive pulmonary disease, unspecified: Secondary | ICD-10-CM

## 2017-03-16 NOTE — Progress Notes (Signed)
Daily Session Note  Patient Details  Name: Devin Martinez MRN: [id number] Date of Birth: 12-18-1940 Referring Provider:     Pulmonary Rehab from 01/13/2017 in [facility] Cardiac and Pulmonary Rehab  Referring Provider  [facility]      Encounter Date: 03/16/2017  Check In:     Session Check In - 03/16/17 1014      Check-In   Location [facility]-Cardiac & Pulmonary Rehab   Staff Present Carson Myrtle, BS, RRT, Respiratory Therapist;Rosaline Ezekiel Amedeo Plenty, BS, ACSM CEP, Exercise Physiologist;Amanda Oletta Darter, BA, ACSM CEP, Exercise Physiologist   Supervising physician immediately available to respond to emergencies [facility] immediately available ER MD   Physician(s) Jimmye Norman and Corky Downs   Medication changes reported     No   Fall or balance concerns reported    No   Warm-up and Cool-down Performed as group-led Location manager Performed Yes   VAD Patient? No     Pain Assessment   Currently in Pain? No/denies   Multiple Pain Sites No         History  Smoking Status  . Former Smoker  . Types: Cigarettes  Smokeless Tobacco  . Never Used    Goals Met:  Proper associated with RPD/PD & O2 Sat Independence with exercise equipment Exercise tolerated well Strength training completed today  Goals Unmet:  Not Applicable  Comments: Pt able to follow exercise prescription today without complaint.  Will continue to monitor for progression.    Dr. Emily Filbert is Medical Director for [place] and [facility].

## 2017-03-18 ENCOUNTER — Telehealth: Payer: Self-pay

## 2017-03-18 NOTE — Telephone Encounter (Signed)
Tashi will be absent due to end of school event.  He will be here Friday.

## 2017-03-20 ENCOUNTER — Encounter: Payer: Non-veteran care | Admitting: *Deleted

## 2017-03-20 DIAGNOSIS — J449 Chronic obstructive pulmonary disease, unspecified: Secondary | ICD-10-CM | POA: Diagnosis not present

## 2017-03-20 NOTE — Progress Notes (Signed)
Daily Session Note  Patient Details  Name: Devin Martinez MRN: [id number] Date of Birth: 13-Jul-1941 Referring Provider:     Pulmonary Rehab from 01/13/2017 in [facility] Cardiac and Pulmonary Rehab  Referring Provider  [facility]      Encounter Date: 03/20/2017  Check In:     Session Check In - 03/20/17 1121      Check-In   Location [facility]-Cardiac & Pulmonary Rehab   Staff Present Gerlene Burdock, RN, Vickki Hearing, BA, ACSM CEP, Exercise Physiologist;Chirsty Armistead Frederico Hamman, RN BSN   Supervising physician immediately available to respond to emergencies [facility] immediately available ER MD   Physician(s) Drs. McShane and Quale   Medication changes reported     No   Fall or balance concerns reported    No   Tobacco Cessation No Change   Warm-up and Cool-down Performed as group-led instruction   Resistance Training Performed Yes   VAD Patient? No     Pain Assessment   Currently in Pain? No/denies         History  Smoking Status  . Former Smoker  . Types: Cigarettes  Smokeless Tobacco  . Never Used    Goals Met:  Proper associated with RPD/PD & O2 Sat Improved SOB with ADL's Using PLB without cueing & demonstrates good technique Exercise tolerated well Strength training completed today  Goals Unmet:  Not Applicable  Comments: Pt able to follow exercise prescription today without complaint.  Will continue to monitor for progression.    Dr. Emily Filbert is Medical Director for [place] and [facility].

## 2017-03-25 DIAGNOSIS — J449 Chronic obstructive pulmonary disease, unspecified: Secondary | ICD-10-CM | POA: Diagnosis not present

## 2017-03-25 NOTE — Progress Notes (Signed)
Daily Session Note  Patient Details  Name: Devin Martinez MRN: [id number] Date of Birth: [birth year] Referring Provider:     Pulmonary Rehab from 01/13/2017 in [facility] Cardiac and Pulmonary Rehab  Referring Provider  [facility]      Encounter Date: 03/25/2017  Check In:     Session Check In - 03/25/17 1206      Check-In   Location [facility]-Cardiac & Pulmonary Rehab   Staff Present Carson Myrtle, BS, RRT, Respiratory Lennie Hummer, MA, ACSM RCEP, Exercise Physiologist;Amanda Oletta Darter, BA, ACSM CEP, Exercise Physiologist   Supervising physician immediately available to respond to emergencies [facility] immediately available ER MD   Physician(s) Kerman Passey and Jimmye Norman   Medication changes reported     No   Fall or balance concerns reported    No   Warm-up and Cool-down Performed as group-led instruction   Resistance Training Performed Yes   VAD Patient? No     Pain Assessment   Currently in Pain? No/denies         History  Smoking Status  . Former Smoker  . Types: Cigarettes  Smokeless Tobacco  . Never Used    Goals Met:  Proper associated with RPD/PD & O2 Sat Independence with exercise equipment Exercise tolerated well Strength training completed today  Goals Unmet:  Not Applicable  Comments: Pt able to follow exercise prescription today without complaint.  Will continue to monitor for progression.    Dr. Emily Filbert is Medical Director for [place] and [facility].

## 2017-03-27 ENCOUNTER — Encounter: Payer: Non-veteran care | Attending: Internal Medicine | Admitting: *Deleted

## 2017-03-27 DIAGNOSIS — J449 Chronic obstructive pulmonary disease, unspecified: Secondary | ICD-10-CM

## 2017-03-27 NOTE — Progress Notes (Signed)
Daily Session Note  Patient Details  Name: Devin Martinez MRN: [id number] Date of Birth: 03-04-1941 Referring Provider:     Pulmonary Rehab from 01/13/2017 in [facility] Cardiac and Pulmonary Rehab  Referring Provider  [facility]      Encounter Date: 03/27/2017  Check In:     Session Check In - 03/27/17 1132      Check-In   Location [facility]-Cardiac & Pulmonary Rehab   Staff Present Heath Lark, RN, BSN, CCRP;Demira Gwynne, RN, Vickki Hearing, BA, ACSM CEP, Exercise Physiologist   Supervising physician immediately available to respond to emergencies [facility] immediately available ER MD   Physician(s) Dr. Alfred Levins and Dr. Burlene Arnt   Medication changes reported     No   Fall or balance concerns reported    No   Tobacco Cessation No Change   Warm-up and Cool-down Performed on first and last piece of equipment   Resistance Training Performed Yes   VAD Patient? No     Pain Assessment   Currently in Pain? No/denies         History  Smoking Status  . Former Smoker  . Types: Cigarettes  Smokeless Tobacco  . Never Used    Goals Met:  Proper associated with RPD/PD & O2 Sat Exercise tolerated well Strength training completed today  Goals Unmet:  Not Applicable  Comments:     Dr. Emily Filbert is Medical Director for [place] and [facility].

## 2017-03-30 ENCOUNTER — Encounter: Payer: Self-pay | Admitting: Respiratory Therapy

## 2017-03-30 ENCOUNTER — Encounter: Payer: Non-veteran care | Admitting: *Deleted

## 2017-03-30 DIAGNOSIS — J449 Chronic obstructive pulmonary disease, unspecified: Secondary | ICD-10-CM

## 2017-03-30 NOTE — Progress Notes (Signed)
Daily Session Note  Patient Details  Name: Devin Martinez MRN: [id number] Date of Birth: [birth year] Referring Provider:     Pulmonary Rehab from 01/13/2017 in [facility] Cardiac and Pulmonary Rehab  Referring Provider  [facility]      Encounter Date: 03/30/2017  Check In:     Session Check In - 03/30/17 1018      Check-In   Location [facility]-Cardiac & Pulmonary Rehab   Staff Present Earlean Shawl, BS, ACSM CEP, Exercise Physiologist;Laureen Owens Shark, BS, RRT, Respiratory Dareen Piano, BA, ACSM CEP, Exercise Physiologist   Supervising physician immediately available to respond to emergencies [facility] immediately available ER MD   Physician(s) Kerman Passey and Schaevitz   Medication changes reported     No   Fall or balance concerns reported    No   Warm-up and Cool-down Performed as group-led instruction   Resistance Training Performed Yes   VAD Patient? No     Pain Assessment   Currently in Pain? No/denies   Multiple Pain Sites No         History  Smoking Status  . Former Smoker  . Types: Cigarettes  Smokeless Tobacco  . Never Used    Goals Met:  Proper associated with RPD/PD & O2 Sat Independence with exercise equipment Exercise tolerated well Strength training completed today  Goals Unmet:  Not Applicable  Comments: Pt able to follow exercise prescription today without complaint.  Will continue to monitor for progression.    Dr. Emily Filbert is Medical Director for [place] and [facility].

## 2017-03-30 NOTE — Progress Notes (Signed)
Pulmonary Individual Treatment Plan  Patient Details  Name: [name] MRN: [id number] Date of Birth: 10-30-1940 Referring Provider:     Pulmonary Rehab from 01/13/2017 in [facility] Cardiac and Pulmonary Rehab  Referring Provider  [place]      Initial Encounter Date:    Pulmonary Rehab from 01/13/2017 in [facility] Cardiac and Pulmonary Rehab  Date  01/13/17  Referring Provider  [facility]      Visit Diagnosis: Chronic obstructive pulmonary disease, unspecified COPD type ([place])  Patient's Home Medications on Admission:  Current Outpatient Prescriptions:    aspirin 81 MG tablet, Take 81 mg by mouth daily. Reported on 04/25/2016, Disp: , Rfl:    budesonide-formoterol (SYMBICORT) 160-4.5 MCG/ACT inhaler, Inhale 2 puffs into the lungs 2 (two) times daily., Disp: , Rfl:    enalapril-hydrochlorothiazide (VASERETIC) 10-25 MG per tablet, Take 1 tablet by mouth every other day. , Disp: , Rfl:    feeding supplement, ENSURE ENLIVE, (ENSURE ENLIVE) LIQD, Take 237 mLs by mouth 2 (two) times daily between meals., Disp: 237 mL, Rfl: 12   ipratropium-albuterol (DUONEB) 0.5-2.5 (3) MG/3ML SOLN, Take 3 mLs by nebulization every 6 (six) hours., Disp: 30 mL, Rfl: 1   Multiple Vitamin (MULTIVITAMIN) tablet, Take 1 tablet by mouth daily. Reported on 04/25/2016, Disp: , Rfl:    OVER THE COUNTER MEDICATION, Take 1 tablet by mouth daily as needed. Reported on 04/25/2016, Disp: , Rfl:    Red Yeast Rice Extract (RED YEAST RICE PO), Take 1 tablet by mouth daily.  , Disp: , Rfl:   Past Medical History: Past Medical History:  Diagnosis Date   Asthma    COPD (chronic obstructive pulmonary disease) (HCC)    Degenerative arthritis of left knee    Diabetes mellitus    Type II   Herpes    Hyperlipidemia    Hypertension    LVH (left ventricular hypertrophy)    Hx. of   Obesity    Smoker     Tobacco Use: History  Smoking Status   Former Smoker   Types: Cigarettes  Smokeless Tobacco   Never Used     Labs: Recent Review [facility] manager for [facility] Cardiac and Pulmonary Rehab Latest Ref Rng & Units 03/16/2016 03/17/2016   Hemoglobin A1c 4.0 - 6.0 % - 5.8   PHART 7.350 - 7.450 7.40 -   PCO2ART 32.0 - 48.0 mmHg 45 -   HCO3 21.0 - 28.0 mEq/L 27.9 -   O2SAT % 91.4 -       ADL UCSD:     Pulmonary Assessment Scores    Row Name 03/04/17 1453         ADL UCSD   ADL Phase Entry     SOB Score total 30     Rest 0     Walk 0     Stairs 3     Bath 3     Dress 1     Shop 1        Pulmonary Function Assessment:   Exercise Target Goals:    Exercise Program Goal: Individual exercise prescription set with THRR, safety & activity barriers. Participant demonstrates ability to understand and report RPE using BORG scale, to self-measure pulse accurately, and to acknowledge the importance of the exercise prescription.  Exercise Prescription Goal: Starting with aerobic activity 30 plus minutes a day, 3 days per week for initial exercise prescription. Provide home exercise prescription and guidelines that participant acknowledges understanding prior to discharge.  Activity  Barriers & Risk Stratification:   6 Minute Walk:     6 Minute Walk    Row Name 03/04/17 1317         6 Minute Walk   Phase Mid Program     Distance 1267 feet     Walk Time 6 minutes     # of Rest Breaks 0     MPH 2.4     METS 2.7     RPE 13     Perceived Dyspnea  3     VO2 Peak 9.5     Symptoms No     Resting HR 92 bpm     Resting BP 138/64     Max Ex. HR 125 bpm     Max Ex. BP 128/58       Interval HR   Baseline HR 92     1 Minute HR 103     2 Minute HR 102     3 Minute HR 103     5 Minute HR 104     6 Minute HR 125     2 Minute Post HR 97       Interval Oxygen   Baseline Oxygen Saturation % 96 %     1 Minute Oxygen Saturation % 91 %     2 Minute Oxygen Saturation % 90 %     3 Minute Oxygen Saturation % 90 %     4 Minute Oxygen Saturation % 91 %     5 Minute Oxygen Saturation  % 91 %     6 Minute Oxygen Saturation % 91 %     2 Minute Post Oxygen Saturation % 94 %       Oxygen Initial Assessment:   Oxygen Re-Evaluation:   Oxygen Discharge (Final Oxygen Re-Evaluation):   Initial Exercise Prescription:   Perform Capillary Blood Glucose checks as needed.  Exercise Prescription Changes:     Exercise Prescription Changes    Row Name 02/04/17 1300 02/18/17 1200 03/04/17 1300 03/18/17 1400       Response to Exercise   Blood Pressure (Admit) 138/68 124/64 138/64 142/82    Blood Pressure (Exercise) 146/84 126/66 134/60 130/70    Blood Pressure (Exit) 120/60 126/66 122/58 126/64    Heart Rate (Admit) 73 bpm 80 bpm 92 bpm 82 bpm    Heart Rate (Exercise) 97 bpm 104 bpm 125 bpm 100 bpm    Heart Rate (Exit) 96 bpm 72 bpm 99 bpm 78 bpm    Oxygen Saturation (Admit) 95 % 100 % 96 % 94 %    Oxygen Saturation (Exercise) 97 % 96 % 90 % 90 %    Oxygen Saturation (Exit) 96 % 93 % 94 % 93 %    Rating of Perceived Exertion (Exercise) 13 12 13 12     Perceived Dyspnea (Exercise) 2 2 3 2     Duration  -- Progress to 45 minutes of aerobic exercise without signs/symptoms of physical distress Progress to 45 minutes of aerobic exercise without signs/symptoms of physical distress Continue with 45 min of aerobic exercise without signs/symptoms of physical distress.    Intensity  -- THRR unchanged THRR unchanged THRR New      Progression   Progression  -- Continue to progress workloads to maintain intensity without signs/symptoms of physical distress. Continue to progress workloads to maintain intensity without signs/symptoms of physical distress. Continue to progress workloads to maintain intensity without signs/symptoms of physical distress.      Resistance  Training   Training Prescription Yes Yes Yes Yes    Weight 3 3 4 4     Reps 10-15 10-15 10-15 10-15      Treadmill   MPH 2 2.1 2.1 2    Grade 0.5 0.5 0.5 0.5    Minutes 15 15 15 15       REL-XR   Level  --  -- 3 4     Minutes  --  -- 15 15    METs  --  -- 4.1 3.3      T5 Nustep   Level 3 3 3 5     SPM 60 108 96 94    Minutes 15 15 15 15     METs 2 2.4 2.2 2.2       Exercise Comments:     Exercise Comments    Row Name 02/04/17 1331 02/18/17 1225 03/04/17 1316 03/18/17 1421     Exercise Comments Zakary is tolerating exercise well. Jaelon is doing well keeping his heart rate in proper training range. Leopold has increased to 4 lb on strength work and increased level on the XR. Kvion continues to progress well with exercise.       Exercise Goals and Review:   Exercise Goals Re-Evaluation :     Exercise Goals Re-Evaluation    Row Name 02/09/17 1225             Exercise Goal Re-Evaluation   Exercise Goals Review Increase Strenth and Stamina       Comments Patient reports that his legs are starting to feel stronger. Home exercise was reviewed with patient and he plans to investigate a possible gym membership at a community facility.        Expected Outcomes Patient will investigate area gym memberships.           Discharge Exercise Prescription (Final Exercise Prescription Changes):     Exercise Prescription Changes - 03/18/17 1400      Response to Exercise   Blood Pressure (Admit) 142/82   Blood Pressure (Exercise) 130/70   Blood Pressure (Exit) 126/64   Heart Rate (Admit) 82 bpm   Heart Rate (Exercise) 100 bpm   Heart Rate (Exit) 78 bpm   Oxygen Saturation (Admit) 94 %   Oxygen Saturation (Exercise) 90 %   Oxygen Saturation (Exit) 93 %   Rating of Perceived Exertion (Exercise) 12   Perceived Dyspnea (Exercise) 2   Duration Continue with 45 min of aerobic exercise without signs/symptoms of physical distress.   Intensity THRR New     Progression   Progression Continue to progress workloads to maintain intensity without signs/symptoms of physical distress.     Resistance Training   Training Prescription Yes   Weight 4   Reps 10-15     Treadmill   MPH 2   Grade 0.5   Minutes  15     REL-XR   Level 4   Minutes 15   METs 3.3     T5 Nustep   Level 5   SPM 94   Minutes 15   METs 2.2      Nutrition:  Target Goals: Understanding of nutrition guidelines, daily intake of sodium 1500mg , cholesterol 200mg , calories 30% from fat and 7% or less from saturated fats, daily to have 5 or more servings of fruits and vegetables.  Biometrics:    Nutrition Therapy Plan and Nutrition Goals:   Nutrition Discharge: Rate Your Plate Scores:   Nutrition Goals Re-Evaluation:   Nutrition Goals  Discharge (Final Nutrition Goals Re-Evaluation):   Psychosocial: Target Goals: Acknowledge presence or absence of significant depression and/or stress, maximize coping skills, provide positive support system. Participant is able to verbalize types and ability to use techniques and skills needed for reducing stress and depression.   Initial Review & Psychosocial Screening:   Quality of Life Scores:   PHQ-9: Recent Review Flowsheet Data    Depression screen [facility] 2/9 01/13/2017 04/25/2016   Decreased Interest 0 0   Down, Depressed, Hopeless 0 0   PHQ - 2 Score 0 0   Altered sleeping 0 -   Tired, decreased energy 1 -   Change in appetite 0 -   Feeling bad or failure about yourself  1 -   Trouble concentrating 0 -   Moving slowly or fidgety/restless 0 -   Suicidal thoughts 0 -   PHQ-9 Score 2 -     Interpretation of Total Score  Total Score Depression Severity:  1-4 = Minimal depression, 5-9 = Mild depression, 10-14 = Moderate depression, 15-19 = Moderately severe depression, 20-27 = Severe depression   Psychosocial Evaluation and Intervention:   Psychosocial Re-Evaluation:     Psychosocial Re-Evaluation    [place] Name 03/16/17 1455             Psychosocial Re-Evaluation   Comments Mr Colao is very positive with his attitude in [facility] class and makes the class laugh with jokes. He works hard at his exercise goals and has made some progress with increased  stamina at home with activities at home. Mr Bastin attends regularly and participates in education.         Expected Outcomes Continue to progress with his exercise goals, enjoy the socialization in [place], and learn new knowledge to self-manage his COPD.          Psychosocial Discharge (Final Psychosocial Re-Evaluation):     Psychosocial Re-Evaluation - 03/16/17 1455      Psychosocial Re-Evaluation   Comments Mr Tigges is very positive with his attitude in [facility] class and makes the class laugh with jokes. He works hard at his exercise goals and has made some progress with increased stamina at home with activities at home. Mr Waymire attends regularly and participates in education.     Expected Outcomes Continue to progress with his exercise goals, enjoy the socialization in [place], and learn new knowledge to self-manage his COPD.      Education: Education Goals: Education classes will be provided on a weekly basis, covering required topics. Participant will state understanding/return demonstration of topics presented.  Learning Barriers/Preferences:   Education Topics: Initial Evaluation Education: - Verbal, written and demonstration of respiratory meds, RPE/PD scales, oximetry and breathing techniques. Instruction on use of nebulizers and MDIs: cleaning and proper use, rinsing mouth with steroid doses and importance of monitoring MDI activations.   Pulmonary Rehab from 03/27/2017 in [facility] Cardiac and Pulmonary Rehab  Date  01/13/17  Educator  LB  Instruction Review Code  2- meets goals/outcomes      General Nutrition Guidelines/Fats and Fiber: -Group instruction provided by verbal, written material, models and posters to present the general guidelines for heart healthy nutrition. Gives an explanation and review of dietary fats and fiber.   Pulmonary Rehab from 03/27/2017 in [facility] Cardiac and Pulmonary Rehab  Date  02/23/17  Educator  CR  Instruction Review Code  2- meets  goals/outcomes      Controlling Sodium/Reading Food Labels: -Group verbal and written material supporting the discussion of sodium  use in heart healthy nutrition. Review and explanation with models, verbal and written materials for utilization of the food label.   Pulmonary Rehab from 03/27/2017 in [facility] Cardiac and Pulmonary Rehab  Date  03/02/17  Educator  CR  Instruction Review Code  2- meets goals/outcomes      Exercise Physiology & Risk Factors: - Group verbal and written instruction with models to review the exercise physiology of the cardiovascular system and associated critical values. Details cardiovascular disease risk factors and the goals associated with each risk factor.   Aerobic Exercise & Resistance Training: - Gives group verbal and written discussion on the health impact of inactivity. On the components of aerobic and resistive training programs and the benefits of this training and how to safely progress through these programs.   Flexibility, Balance, General Exercise Guidelines: - Provides group verbal and written instruction on the benefits of flexibility and balance training programs. Provides general exercise guidelines with specific guidelines to those with heart or lung disease. Demonstration and skill practice provided.   Pulmonary Rehab from 03/27/2017 in [facility] Cardiac and Pulmonary Rehab  Date  03/27/17  Educator  Nada Maclachlan,  Instruction Review Code  2- meets goals/outcomes      Stress Management: - Provides group verbal and written instruction about the health risks of elevated stress, cause of high stress, and healthy ways to reduce stress.   Depression: - Provides group verbal and written instruction on the correlation between heart/lung disease and depressed mood, treatment options, and the stigmas associated with seeking treatment.   Pulmonary Rehab from 03/27/2017 in [facility] Cardiac and Pulmonary Rehab  Date  03/25/17  Educator  [facility]  Instruction Review  Code  2- meets goals/outcomes      Exercise & Equipment Safety: - Individual verbal instruction and demonstration of equipment use and safety with use of the equipment.   Pulmonary Rehab from 03/27/2017 in [facility] Cardiac and Pulmonary Rehab  Date  01/26/17  Educator  AS  Instruction Review Code  2- meets goals/outcomes      Infection Prevention: - Provides verbal and written material to individual with discussion of infection control including proper hand washing and proper equipment cleaning during exercise session.   Pulmonary Rehab from 03/27/2017 in [facility] Cardiac and Pulmonary Rehab  Date  01/26/17  Educator  AS  Instruction Review Code  2- meets goals/outcomes      Falls Prevention: - Provides verbal and written material to individual with discussion of falls prevention and safety.   Pulmonary Rehab from 03/27/2017 in [facility] Cardiac and Pulmonary Rehab  Date  01/13/17  Educator  LB  Instruction Review Code  2- meets goals/outcomes      Diabetes: - Individual verbal and written instruction to review signs/symptoms of diabetes, desired ranges of glucose level fasting, after meals and with exercise. Advice that pre and post exercise glucose checks will be done for 3 sessions at entry of program.   Pulmonary Rehab from 03/27/2017 in [facility] Cardiac and Pulmonary Rehab  Date  03/13/17  Educator  CE  Instruction Review Code  2- meets goals/outcomes      Chronic Lung Diseases: - Group verbal and written instruction to review new updates, new respiratory medications, new advancements in procedures and treatments. Provide informative websites and "800" numbers of self-education.   Pulmonary Rehab from 03/27/2017 in [facility] Cardiac and Pulmonary Rehab  Date  02/04/17  Educator  LB  Instruction Review Code  2- meets goals/outcomes      Lung Procedures: -  Group verbal and written instruction to describe testing methods done to diagnose lung disease. Review the outcome of test results. Describe  the treatment choices: Pulmonary Function Tests, ABGs and oximetry.   Energy Conservation: - Provide group verbal and written instruction for methods to conserve energy, plan and organize activities. Instruct on pacing techniques, use of adaptive equipment and posture/positioning to relieve shortness of breath.   Triggers: - Group verbal and written instruction to review types of environmental controls: home humidity, furnaces, filters, dust mite/pet prevention, HEPA vacuums. To discuss weather changes, air quality and the benefits of nasal washing.   Exacerbations: - Group verbal and written instruction to provide: warning signs, infection symptoms, calling MD promptly, preventive modes, and value of vaccinations. Review: effective airway clearance, coughing and/or vibration techniques. Create an Sports administrator.   Oxygen: - Individual and group verbal and written instruction on oxygen therapy. Includes supplement oxygen, available portable oxygen systems, continuous and intermittent flow rates, oxygen safety, concentrators, and Medicare reimbursement for oxygen.   Pulmonary Rehab from 03/27/2017 in [facility] Cardiac and Pulmonary Rehab  Date  01/13/17  Educator  LB  Instruction Review Code  2- meets goals/outcomes      Respiratory Medications: - Group verbal and written instruction to review medications for lung disease. Drug class, frequency, complications, importance of spacers, rinsing mouth after steroid MDI's, and proper cleaning methods for nebulizers.   Pulmonary Rehab from 03/27/2017 in [facility] Cardiac and Pulmonary Rehab  Date  01/13/17  Educator  LB  Instruction Review Code  2- meets goals/outcomes      AED/CPR: - Group verbal and written instruction with the use of models to demonstrate the basic use of the AED with the basic ABC's of resuscitation.   Pulmonary Rehab from 03/27/2017 in [facility] Cardiac and Pulmonary Rehab  Date  02/20/17  Educator  MA  Instruction Review Code  2- meets  goals/outcomes      Breathing Retraining: - Provides individuals verbal and written instruction on purpose, frequency, and proper technique of diaphragmatic breathing and pursed-lipped breathing. Applies individual practice skills.   Pulmonary Rehab from 03/27/2017 in [facility] Cardiac and Pulmonary Rehab  Date  01/26/17  Educator  AS  Instruction Review Code  2- meets goals/outcomes      Anatomy and Physiology of the Lungs: - Group verbal and written instruction with the use of models to provide basic lung anatomy and physiology related to function, structure and complications of lung disease.   Heart Failure: - Group verbal and written instruction on the basics of heart failure: signs/symptoms, treatments, explanation of ejection fraction, enlarged heart and cardiomyopathy.   Sleep Apnea: - Individual verbal and written instruction to review Obstructive Sleep Apnea. Review of risk factors, methods for diagnosing and types of masks and machines for OSA.   Anxiety: - Provides group, verbal and written instruction on the correlation between heart/lung disease and anxiety, treatment options, and management of anxiety.   Pulmonary Rehab from 03/27/2017 in [facility] Cardiac and Pulmonary Rehab  Date  01/28/17  Educator  [facility]  Instruction Review Code  2- Meets goals/outcomes      Relaxation: - Provides group, verbal and written instruction about the benefits of relaxation for patients with heart/lung disease. Also provides patients with examples of relaxation techniques.   Pulmonary Rehab from 03/27/2017 in [facility] Cardiac and Pulmonary Rehab  Date  02/25/17  Educator  [facility]  Instruction Review Code  2- Meets goals/outcomes      Knowledge Questionnaire Score:    Core  Components/Risk Factors/Patient Goals at Admission:   Core Components/Risk Factors/Patient Goals Review:      Goals and Risk Factor Review    Row Name 02/09/17 1227 03/16/17 1516 03/16/17 1519         Core Components/Risk  Factors/Patient Goals Review   Personal Goals Review Improve shortness of breath with ADL's;Develop more efficient breathing techniques such as purse lipped breathing and diaphragmatic breathing and practicing self-pacing with activity. Increase knowledge of respiratory medications and ability to use respiratory devices properly.;Improve shortness of breath with ADL's;Develop more efficient breathing techniques such as purse lipped breathing and diaphragmatic breathing and practicing self-pacing with activity.  --     Review Patient reported that he uses PLB when needed to help control SOB during ADL's and exercise.  Mr Prabhakar has completed 20 sessions in [place] and has progressed with his exercise goals. He improved his mid 53mwd by 59ft.   Mr Wiseman has completed 20 sessions in [place] and has progressed with his exercise goals. He improved his mid 10mwd by 75ft.  He uses his oxygen as needed, especially if outside and develops shortness of breath. I encouraged him to use PLB.  Although his shortness of breath and stamina has not improved but slightly, he is interested in joining a gym after [facility]  and use his [employer]. Mr Ra is commended for his regular attendence and participation in [employer] education.     Expected Outcomes Patient will continue to attend class regularly and use PLB as needed.   -- Continue exercising and improving his goals and attend education regularly for self-management of his COPD.        Core Components/Risk Factors/Patient Goals at Discharge (Final Review):      Goals and Risk Factor Review - 03/16/17 1519      Core Components/Risk Factors/Patient Goals Review   Review Mr Vizzini has completed 20 sessions in [facility] and has progressed with his exercise goals. He improved his mid 57mwd by 67ft.  He uses his oxygen as needed, especially if outside and develops shortness of breath. I encouraged him to use PLB.  Although his shortness of breath and  stamina has not improved but slightly, he is interested in joining a gym after [facility]  and use his [employer]. Mr Ng is commended for his regular attendence and participation in [employer] education.   Expected Outcomes Continue exercising and improving his goals and attend education regularly for self-management of his COPD.      ITP Comments:     ITP Comments    Row Name 03/02/17 0758 03/30/17 0755         ITP Comments  30 day note review by Dr Emily Filbert, Medical Director of [place] 30 day note review by Dr Emily Filbert, Medical Director of [facility]         Comments: 30 day note review by Dr Emily Filbert, Medical Director of [place]

## 2017-04-01 ENCOUNTER — Encounter: Payer: Non-veteran care | Admitting: *Deleted

## 2017-04-01 DIAGNOSIS — J449 Chronic obstructive pulmonary disease, unspecified: Secondary | ICD-10-CM

## 2017-04-01 NOTE — Progress Notes (Signed)
Daily Session Note  Patient Details  Name: [name] MRN: [id number] Date of Birth: 1940/12/02 Referring Provider:     Pulmonary Rehab from 01/13/2017 in [facility] Cardiac and Pulmonary Rehab  Referring Provider  [facility]      Encounter Date: 04/01/2017  Check In:     Session Check In - 04/01/17 1110      Check-In   Location [facility]-Cardiac & Pulmonary Rehab   Staff Present Alberteen Sam, MA, ACSM RCEP, Exercise Physiologist;Amanda Oletta Darter, BA, ACSM CEP, Exercise Physiologist;Laureen Janell Quiet, RRT, Respiratory Therapist   Supervising physician immediately available to respond to emergencies [facility] immediately available ER MD   Physician(s) Drs. Darl Householder and McShane   Medication changes reported     No   Fall or balance concerns reported    No   Warm-up and Cool-down Performed as group-led Location manager Performed Yes   VAD Patient? No     Pain Assessment   Currently in Pain? No/denies   Multiple Pain Sites No         History  Smoking Status  . Former Smoker  . Types: Cigarettes  Smokeless Tobacco  . Never Used    Goals Met:  Proper associated with RPD/PD & O2 Sat Independence with exercise equipment Using PLB without cueing & demonstrates good technique Exercise tolerated well Strength training completed today  Goals Unmet:  Not Applicable  Comments: Pt able to follow exercise prescription today without complaint.  Will continue to monitor for progression.    Dr. Emily Filbert is Medical Director for [place] and [facility] Pulmonary Rehabilitation.

## 2017-04-06 ENCOUNTER — Encounter: Payer: Non-veteran care | Admitting: *Deleted

## 2017-04-06 DIAGNOSIS — J449 Chronic obstructive pulmonary disease, unspecified: Secondary | ICD-10-CM

## 2017-04-06 NOTE — Progress Notes (Signed)
Daily Session Note  Patient Details  Name: TERI DILTZ MRN: [id number] Date of Birth: 10-15-1941 Referring Provider:     Pulmonary Rehab from 01/13/2017 in [facility] Cardiac and Pulmonary Rehab  Referring Provider  [facility]      Encounter Date: 04/06/2017  Check In:     Session Check In - 04/06/17 1105      Check-In   Location [facility]-Cardiac & Pulmonary Rehab   Staff Present Earlean Shawl, BS, ACSM CEP, Exercise Physiologist;Laureen Owens Shark, BS, RRT, Respiratory Dareen Piano, BA, ACSM CEP, Exercise Physiologist   Supervising physician immediately available to respond to emergencies [facility] immediately available ER MD   Physician(s) Mable Paris and Quentin Cornwall   Medication changes reported     No   Fall or balance concerns reported    No   Warm-up and Cool-down Performed as group-led instruction   Resistance Training Performed Yes   VAD Patient? No     Pain Assessment   Currently in Pain? No/denies   Multiple Pain Sites No         History  Smoking Status  . Former Smoker  . Types: Cigarettes  Smokeless Tobacco  . Never Used    Goals Met:  Proper associated with RPD/PD & O2 Sat Independence with exercise equipment Exercise tolerated well Strength training completed today  Goals Unmet:  Not Applicable  Comments: Pt able to follow exercise prescription today without complaint.  Will continue to monitor for progression.    Dr. Emily Filbert is Medical Director for [place] and [facility].

## 2017-04-08 DIAGNOSIS — J449 Chronic obstructive pulmonary disease, unspecified: Secondary | ICD-10-CM

## 2017-04-08 NOTE — Progress Notes (Signed)
Daily Session Note  Patient Details  Name: Devin Martinez MRN: [id number] Date of Birth: 05/24/1941 Referring Provider:     Pulmonary Rehab from 01/13/2017 in [facility] Cardiac and Pulmonary Rehab  Referring Provider  [facility]      Encounter Date: 04/08/2017  Check In:     Session Check In - 04/08/17 1153      Check-In   Location [facility]-Cardiac & Pulmonary Rehab   Staff Present Laureen Brown, BS, RRT, Respiratory Therapist;Jessica Hawkins, MA, ACSM RCEP, Exercise Physiologist;Amanda Sommer, BA, ACSM CEP, Exercise Physiologist   Supervising physician immediately available to respond to emergencies [facility] immediately available ER MD   Physician(s) Paduchowski and Norman   Medication changes reported     No   Fall or balance concerns reported    No   Warm-up and Cool-down Performed as group-led instruction   Resistance Training Performed Yes   VAD Patient? No     Pain Assessment   Currently in Pain? No/denies         History  Smoking Status  . Former Smoker  . Types: Cigarettes  Smokeless Tobacco  . Never Used    Goals Met:  Proper associated with RPD/PD & O2 Sat Independence with exercise equipment Exercise tolerated well Strength training completed today  Goals Unmet:  Not Applicable  Comments: Pt able to follow exercise prescription today without complaint.  Will continue to monitor for progression.    Dr. Mark Miller is Medical Director for [facility] and [facility]. 

## 2017-04-10 ENCOUNTER — Encounter: Payer: Non-veteran care | Admitting: *Deleted

## 2017-04-10 DIAGNOSIS — J449 Chronic obstructive pulmonary disease, unspecified: Secondary | ICD-10-CM | POA: Diagnosis not present

## 2017-04-10 NOTE — Progress Notes (Signed)
Daily Session Note  Patient Details  Name: Devin Martinez MRN: [id number] Date of Birth: November 03, 1940 Referring Provider:     Pulmonary Rehab from 01/13/2017 in [facility] Cardiac and Pulmonary Rehab  Referring Provider  [facility]      Encounter Date: 04/10/2017  Check In:     Session Check In - 04/10/17 1046      Check-In   Location [facility]-Cardiac & Pulmonary Rehab   Staff Present Justin Mend RCP,RRT,BSRT;Chandlor Noecker, RN, Vickki Hearing, BA, ACSM CEP, Exercise Physiologist   Supervising physician immediately available to respond to emergencies See telemetry face sheet for immediately available ER MD   Medication changes reported     No   Fall or balance concerns reported    No   Tobacco Cessation No Change   Warm-up and Cool-down Performed on first and last piece of equipment   Resistance Training Performed Yes   VAD Patient? No     Pain Assessment   Currently in Pain? No/denies         History  Smoking Status  . Former Smoker  . Types: Cigarettes  Smokeless Tobacco  . Never Used    Goals Met:  Proper associated with RPD/PD & O2 Sat No report of cardiac concerns or symptoms  Goals Unmet:  Not Applicable  Comments:     Dr. Emily Filbert is Medical Director for [place] and [facility].

## 2017-04-13 ENCOUNTER — Encounter: Payer: Non-veteran care | Admitting: *Deleted

## 2017-04-13 DIAGNOSIS — J449 Chronic obstructive pulmonary disease, unspecified: Secondary | ICD-10-CM

## 2017-04-13 NOTE — Progress Notes (Signed)
Daily Session Note  Patient Details  Name: Devin Martinez MRN: [id number] Date of Birth: June 05, 1941 Referring Provider:     Pulmonary Rehab from 01/13/2017 in [facility] Cardiac and Pulmonary Rehab  Referring Provider  [facility]      Encounter Date: 04/13/2017  Check In:     Session Check In - 04/13/17 1041      Check-In   Location [facility]Cardiac & Pulmonary Rehab   Staff Present Justin Mend RCP,RRT,BSRT;Laureen Owens Shark, BS, RRT, Respiratory Bertis Ruddy, BS, ACSM CEP, Exercise Physiologist;Amanda Oletta Darter, BA, ACSM CEP, Exercise Physiologist   Supervising physician immediately available to respond to emergencies [facility] immediately available ER MD   Physician(s) Dr. Burlene Arnt and Dr. Mariea Clonts   Medication changes reported     No   Fall or balance concerns reported    No   Warm-up and Cool-down Performed as group-led instruction   Resistance Training Performed Yes   VAD Patient? No     Pain Assessment   Currently in Pain? No/denies   Multiple Pain Sites No         History  Smoking Status  . Former Smoker  . Types: Cigarettes  Smokeless Tobacco  . Never Used    Goals Met:  Proper associated with RPD/PD & O2 Sat Independence with exercise equipment Exercise tolerated well Strength training completed today  Goals Unmet:  Not Applicable  Comments: Pt able to follow exercise prescription today without complaint.  Will continue to monitor for progression.    Dr. Emily Filbert is Medical Director for [place] and [facility].

## 2017-04-14 DIAGNOSIS — J441 Chronic obstructive pulmonary disease with (acute) exacerbation: Secondary | ICD-10-CM | POA: Diagnosis not present

## 2017-04-17 ENCOUNTER — Telehealth: Payer: Self-pay

## 2017-04-17 NOTE — Telephone Encounter (Signed)
Devin Martinez called to say that he is not feeling well and will be back next week.

## 2017-04-20 ENCOUNTER — Encounter: Payer: Non-veteran care | Admitting: *Deleted

## 2017-04-20 DIAGNOSIS — J449 Chronic obstructive pulmonary disease, unspecified: Secondary | ICD-10-CM

## 2017-04-20 NOTE — Progress Notes (Signed)
Daily Session Note  Patient Details  Name: Devin Martinez MRN: [id number] Date of Birth: [birth year] Referring Provider:     Pulmonary Rehab from 01/13/2017 in [facility] Cardiac and Pulmonary Rehab  Referring Provider  [facility]      Encounter Date: 04/20/2017  Check In:     Session Check In - 04/20/17 1047      Check-In   Location [facility]Cardiac & Pulmonary Rehab   Staff Present Carson Myrtle, BS, RRT, Respiratory Therapist;Islay Polanco Amedeo Plenty, BS, ACSM CEP, Exercise Physiologist;Amanda Oletta Darter, BA, ACSM CEP, Exercise Physiologist   Supervising physician immediately available to respond to emergencies [facility] immediately available ER MD   Physician(s) Dr. Corky Downs and Dr. Mable Paris   Medication changes reported     No   Fall or balance concerns reported    No   Warm-up and Cool-down Performed as group-led instruction   Resistance Training Performed Yes   VAD Patient? No     Pain Assessment   Currently in Pain? No/denies   Multiple Pain Sites No         History  Smoking Status  . Former Smoker  . Types: Cigarettes  Smokeless Tobacco  . Never Used    Goals Met:  Proper associated with RPD/PD & O2 Sat Independence with exercise equipment Exercise tolerated well Strength training completed today  Goals Unmet:  Not Applicable  Comments: Pt able to follow exercise prescription today without complaint.  Will continue to monitor for progression.    Dr. Emily Filbert is Medical Director for [place] and [facility].

## 2017-04-22 ENCOUNTER — Encounter: Payer: Non-veteran care | Admitting: *Deleted

## 2017-04-22 DIAGNOSIS — J449 Chronic obstructive pulmonary disease, unspecified: Secondary | ICD-10-CM | POA: Diagnosis not present

## 2017-04-22 NOTE — Progress Notes (Signed)
Daily Session Note  Patient Details  Name: Devin Martinez MRN: [id number] Date of Birth: 1941-02-21 Referring Provider:     Pulmonary Rehab from 01/13/2017 in [facility] Cardiac and Pulmonary Rehab  Referring Provider  [facility]      Encounter Date: 04/22/2017  Check In:     Session Check In - 04/22/17 1116      Check-In   Location [facility]-Cardiac & Pulmonary Rehab   Staff Present Alberteen Sam, MA, ACSM RCEP, Exercise Physiologist;Krista Frederico Hamman, RN BSN;Laureen Janell Quiet, RRT, Respiratory Therapist   Supervising physician immediately available to respond to emergencies [facility] immediately available ER MD   Physician(s) Drs. Jimmye Norman and Quentin Cornwall   Medication changes reported     No   Fall or balance concerns reported    No   Warm-up and Cool-down Performed as group-led Location manager Performed Yes   VAD Patient? No     Pain Assessment   Currently in Pain? No/denies   Multiple Pain Sites No         History  Smoking Status  . Former Smoker  . Types: Cigarettes  Smokeless Tobacco  . Never Used    Goals Met:  Proper associated with RPD/PD & O2 Sat Independence with exercise equipment Using PLB without cueing & demonstrates good technique Exercise tolerated well Strength training completed today  Goals Unmet:  Not Applicable  Comments: Pt able to follow exercise prescription today without complaint.  Will continue to monitor for progression.     [place] Name 01/13/17 1425 03/04/17 1317 04/22/17 1118     6 Minute Walk   Phase  - Mid Program Discharge   Distance 1214 feet 1267 feet 1315 feet   Distance % Change  -  - 8.3 %  101 ft   Walk Time 6 minutes 6 minutes 6 minutes   # of Rest Breaks 0 0 0   MPH 2.29 2.4 2.49   METS 2.27 2.7 3.04   RPE _0 Perceived Dyspnea  _1 VO2 Peak 7.95 9.5 10.64   Symptoms No No No   Resting HR 71 bpm 92 bpm 95 bpm   Resting BP 128/62 138/64 142/60   Max Ex. HR 97 bpm 125 bpm 122 bpm   Max Ex. BP 126/58 128/58 154/74   2 Minute Post BP  -  - 146/70     Interval HR   Baseline HR 71 92 95   1 Minute HR 81 103 121   2 Minute HR 90 102 116   3 Minute HR 95 103 119   4 Minute HR 96  - 122   5 Minute HR 97 104 122   6 Minute HR 96 125 113   2 Minute Post HR  - 97 97   Interval Heart Rate? Yes  - Yes     Interval Oxygen   Interval Oxygen? Yes  - Yes   Baseline Oxygen Saturation % 94 % 96 % 92 %   Baseline Liters of Oxygen  -  - 0 L  Room Air   1 Minute Oxygen Saturation % 93 % 91 % 91 %   1 Minute Liters of Oxygen  -  - 0 L   2 Minute Oxygen Saturation % 90 % 90 % 91 %   2 Minute Liters of Oxygen  -  - 0 L   3 Minute Oxygen Saturation % 91 % 90 %  91 %   3 Minute Liters of Oxygen  -  - 0 L   4 Minute Oxygen Saturation % 92 % 91 % 91 %   4 Minute Liters of Oxygen  -  - 0 L   5 Minute Oxygen Saturation % 92 % 91 % 91 %   5 Minute Liters of Oxygen  -  - 0 L   6 Minute Oxygen Saturation % 93 % 91 % 91 %   6 Minute Liters of Oxygen  -  - 0 L   2 Minute Post Oxygen Saturation %  - 94 % 94 %   2 Minute Post Liters of Oxygen  -  - 0 L        Dr. Emily Filbert is Medical Director for [place] and [facility].

## 2017-04-24 ENCOUNTER — Encounter: Payer: Non-veteran care | Admitting: *Deleted

## 2017-04-24 DIAGNOSIS — J449 Chronic obstructive pulmonary disease, unspecified: Secondary | ICD-10-CM

## 2017-04-24 NOTE — Progress Notes (Signed)
Daily Session Note  Patient Details  Name: Devin Martinez MRN: [id number] Date of Birth: [birth year] Referring Provider:     Pulmonary Rehab from 01/13/2017 in [facility] Cardiac and Pulmonary Rehab  Referring Provider  [facility]      Encounter Date: 04/24/2017  Check In:     Session Check In - 04/24/17 1028      Check-In   Location [facility]-Cardiac & Pulmonary Rehab   Staff Present Renita Papa, RN BSN;Carroll Enterkin, RN, BSN   Supervising physician immediately available to respond to emergencies [facility] immediately available ER MD   Physician(s) Dr. Clearnce Hasten and Alfred Levins    Medication changes reported     No   Fall or balance concerns reported    No   Warm-up and Cool-down Performed as group-led instruction   Resistance Training Performed Yes   VAD Patient? No     Pain Assessment   Currently in Pain? No/denies         History  Smoking Status  . Former Smoker  . Types: Cigarettes  Smokeless Tobacco  . Never Used    Goals Met:  Proper associated with RPD/PD & O2 Sat Independence with exercise equipment Using PLB without cueing & demonstrates good technique Exercise tolerated well Strength training completed today  Goals Unmet:  Not Applicable  Comments: Pt able to follow exercise prescription today without complaint.  Will continue to monitor for progression.    Dr. Emily Filbert is Medical Director for [place] and [facility].

## 2017-04-27 ENCOUNTER — Encounter: Payer: Non-veteran care | Attending: Family Medicine | Admitting: *Deleted

## 2017-04-27 DIAGNOSIS — J449 Chronic obstructive pulmonary disease, unspecified: Secondary | ICD-10-CM

## 2017-04-27 NOTE — Progress Notes (Signed)
Pulmonary Individual Treatment Plan  Patient Details  Name: Devin Martinez MRN: [id number] Date of Birth: 1941-09-07 Referring Provider:     Pulmonary Rehab from 01/13/2017 in [facility] Pul[facility] Re[facility]  Referring Provider  Delaplaine      Initial Encounter Date:    Pulmonary Rehab from 01/13/2017 in [facility] Cardiac and Pulmonary Rehab  Date  01/13/17  Referring Provider  [facility]      Visit Diagnosis: Chronic obstructive pulmonary disease, unspecified COPD type ([place])  Patient's Home Medications on Admission:  Current Outpatient Prescriptions:  .  aspirin 81 MG tablet, Take 81 mg by mouth daily. Reported on 04/25/2016, Disp: , Rfl:  .  budesonide-formoterol (SYMBICORT) 160-4.5 MCG/ACT inhaler, Inhale 2 puffs into the lungs 2 (two) times daily., Disp: , Rfl:  .  enalapril-hydrochlorothiazide (VASERETIC) 10-25 MG per tablet, Take 1 tablet by mouth every other day. , Disp: , Rfl:  .  feeding supplement, ENSURE ENLIVE, (ENSURE ENLIVE) LIQD, Take 237 mLs by mouth 2 (two) times daily between meals., Disp: 237 mL, Rfl: 12 .  ipratropium-albuterol (DUONEB) 0.5-2.5 (3) MG/3ML SOLN, Take 3 mLs by nebulization every 6 (six) hours., Disp: 30 mL, Rfl: 1 .  Multiple Vitamin (MULTIVITAMIN) tablet, Take 1 tablet by mouth daily. Reported on 04/25/2016, Disp: , Rfl:  .  OVER THE COUNTER MEDICATION, Take 1 tablet by mouth daily as needed. Reported on 04/25/2016, Disp: , Rfl:  .  Red Yeast Rice Extract (RED YEAST RICE PO), Take 1 tablet by mouth daily.  , Disp: , Rfl:   Past Medical History: Past Medical History:  Diagnosis Date  . Asthma   . COPD (chronic obstructive pulmonary disease) ([place])   . Degenerative arthritis of left knee   . Diabetes mellitus    Type II  . Herpes   . Hyperlipidemia   . Hypertension   . LVH (left ventricular hypertrophy)    Hx. of  . Obesity   . Smoker     Tobacco Use: History  Smoking Status  . Former Smoker  . Types: Cigarettes  Smokeless Tobacco  . Never Used     Labs: Recent Review Flowsheet Data    Labs for ITP Cardiac and Pulmonary Rehab Latest Ref Rng & Units 03/16/2016 03/17/2016   Hemoglobin A1c 4.0 - 6.0 % - 5.8   PHART 7.350 - 7.450 7.40 -   PCO2ART 32.0 - 48.0 mmHg 45 -   HCO3 21.0 - 28.0 mEq/L 27.9 -   O2SAT % 91.4 -       ADL UCSD:     Pulmonary Assessment Scores    Row Name 03/04/17 1453 04/20/17 1443       ADL UCSD   ADL Phase Entry Exit    SOB Score total 30 44    Rest 0 1    Walk 0 2    Stairs 3 3    Bath 3 3    Dress 1 2    Shop 1 2       Pulmonary Function Assessment:   Exercise Target Goals:    Exercise Program Goal: Individual exercise prescription set with THRR, safety & activity barriers. Participant demonstrates ability to understand and report RPE using BORG scale, to self-measure pulse accurately, and to acknowledge the importance of the exercise prescription.  Exercise Prescription Goal: Starting with aerobic activity 30 plus minutes a day, 3 days per week for initial exercise prescription. Provide home exercise prescription and guidelines that participant acknowledges understanding prior to discharge.  Activity  Barriers & Risk Stratification:   6 Minute Walk:     6 Minute Walk    Row Name 03/04/17 1317 04/22/17 1118       6 Minute Walk   Phase Mid Program Discharge    Distance 1267 feet 1315 feet    Distance % Change  - 8.3 %  101 ft    Walk Time 6 minutes 6 minutes    # of Rest Breaks 0 0    MPH 2.4 2.49    METS 2.7 3.04    RPE 13 13    Perceived Dyspnea  3 3    VO2 Peak 9.5 10.64    Symptoms No No    Resting HR 92 bpm 95 bpm    Resting BP 138/64 142/60    Max Ex. HR 125 bpm 122 bpm    Max Ex. BP 128/58 154/74    2 Minute Post BP  - 146/70      Interval HR   Baseline HR 92 95    1 Minute HR 103 121    2 Minute HR 102 116    3 Minute HR 103 119    4 Minute HR  - 122    5 Minute HR 104 122    6 Minute HR 125 113    2 Minute Post HR 97 97    Interval Heart Rate?  -  Yes      Interval Oxygen   Interval Oxygen?  - Yes    Baseline Oxygen Saturation % 96 % 92 %    Baseline Liters of Oxygen  - 0 L  Room Air    1 Minute Oxygen Saturation % 91 % 91 %    1 Minute Liters of Oxygen  - 0 L    2 Minute Oxygen Saturation % 90 % 91 %    2 Minute Liters of Oxygen  - 0 L    3 Minute Oxygen Saturation % 90 % 91 %    3 Minute Liters of Oxygen  - 0 L    4 Minute Oxygen Saturation % 91 % 91 %    4 Minute Liters of Oxygen  - 0 L    5 Minute Oxygen Saturation % 91 % 91 %    5 Minute Liters of Oxygen  - 0 L    6 Minute Oxygen Saturation % 91 % 91 %    6 Minute Liters of Oxygen  - 0 L    2 Minute Post Oxygen Saturation % 94 % 94 %    2 Minute Post Liters of Oxygen  - 0 L      Oxygen Initial Assessment:   Oxygen Re-Evaluation:   Oxygen Discharge (Final Oxygen Re-Evaluation):   Initial Exercise Prescription:   Perform Capillary Blood Glucose checks as needed.  Exercise Prescription Changes:     Exercise Prescription Changes    Row Name 03/04/17 1300 03/18/17 1400 04/01/17 1200 04/15/17 1200       Response to Exercise   Blood Pressure (Admit) 138/64 142/82 126/74 118/66    Blood Pressure (Exercise) 134/60 130/70 126/64 162/76    Blood Pressure (Exit) 122/58 126/64 106/54 120/62    Heart Rate (Admit) 92 bpm 82 bpm 86 bpm 65 bpm    Heart Rate (Exercise) 125 bpm 100 bpm 105 bpm 98 bpm    Heart Rate (Exit) 99 bpm 78 bpm 82 bpm 87 bpm    Oxygen Saturation (Admit) 96 % 94 % 96 %  95 %    Oxygen Saturation (Exercise) 90 % 90 % 94 % 93 %    Oxygen Saturation (Exit) 94 % 93 % 94 % 92 %    Rating of Perceived Exertion (Exercise) '13 12 12 12    ' Perceived Dyspnea (Exercise) '3 2 3 3    ' Duration Progress to 45 minutes of aerobic exercise without signs/symptoms of physical distress Continue with 45 min of aerobic exercise without signs/symptoms of physical distress. Continue with 45 min of aerobic exercise without signs/symptoms of physical distress. Continue with  45 min of aerobic exercise without signs/symptoms of physical distress.    Intensity THRR unchanged THRR New  - THRR unchanged      Progression   Progression Continue to progress workloads to maintain intensity without signs/symptoms of physical distress. Continue to progress workloads to maintain intensity without signs/symptoms of physical distress. Continue to progress workloads to maintain intensity without signs/symptoms of physical distress. Continue to progress workloads to maintain intensity without signs/symptoms of physical distress.      Resistance Training   Training Prescription Yes Yes Yes Yes    Weight '4 4 4 4    ' Reps 10-15 10-15 10-15 10-15      Treadmill   MPH 2.1 2 2.4 2.5    Grade 0.5 0.5 0.5 0.5    Minutes '15 15 15 15      ' REL-XR   Level 3 4  - 4    Minutes 15 15  - 15    METs 4.1 3.3  - 4.4      T5 Nustep   Level '3 5 3 4    ' SPM 96 94 94 110    Minutes '15 15 15 15    ' METs 2.2 2.2 2.2 2.4       Exercise Comments:     Exercise Comments    Row Name 03/04/17 1316 03/18/17 1421 04/01/17 1240 04/15/17 1300     Exercise Comments Kirtis has increased to 4 lb on strength work and increased level on the XR. Aydien continues to progress well with exercise. Donyea is tolerating exercise well.  Staff will encourage him to add interval training. Staff will encourage Kalan to work a little harder to reach his THR.       Exercise Goals and Review:   Exercise Goals Re-Evaluation :     Exercise Goals Re-Evaluation    Row Name 04/22/17 1120             Exercise Goal Re-Evaluation   Exercise Goals Review Increase Strenth and Stamina;Increase Physical Activity       Comments Saheed improved his post 6MWT by 138f!!          Discharge Exercise Prescription (Final Exercise Prescription Changes):     Exercise Prescription Changes - 04/15/17 1200      Response to Exercise   Blood Pressure (Admit) 118/66   Blood Pressure (Exercise) 162/76   Blood Pressure (Exit)  120/62   Heart Rate (Admit) 65 bpm   Heart Rate (Exercise) 98 bpm   Heart Rate (Exit) 87 bpm   Oxygen Saturation (Admit) 95 %   Oxygen Saturation (Exercise) 93 %   Oxygen Saturation (Exit) 92 %   Rating of Perceived Exertion (Exercise) 12   Perceived Dyspnea (Exercise) 3   Duration Continue with 45 min of aerobic exercise without signs/symptoms of physical distress.   Intensity THRR unchanged     Progression   Progression Continue to progress workloads to maintain intensity  without signs/symptoms of physical distress.     Resistance Training   Training Prescription Yes   Weight 4   Reps 10-15     Treadmill   MPH 2.5   Grade 0.5   Minutes 15     REL-XR   Level 4   Minutes 15   METs 4.4     T5 Nustep   Level 4   SPM 110   Minutes 15   METs 2.4      Nutrition:  Target Goals: Understanding of nutrition guidelines, daily intake of sodium <1528m, cholesterol <2066m calories 30% from fat and 7% or less from saturated fats, daily to have 5 or more servings of fruits and vegetables.  Biometrics:    Nutrition Therapy Plan and Nutrition Goals:   Nutrition Discharge: Rate Your Plate Scores:   Nutrition Goals Re-Evaluation:   Nutrition Goals Discharge (Final Nutrition Goals Re-Evaluation):   Psychosocial: Target Goals: Acknowledge presence or absence of significant depression and/or stress, maximize coping skills, provide positive support system. Participant is able to verbalize types and ability to use techniques and skills needed for reducing stress and depression.   Initial Review & Psychosocial Screening:   Quality of Life Scores:     Quality of Life - 04/20/17 1505      Quality of Life Scores   Health/Function Pre 20.38 %   Health/Function Post 15.19 %   Health/Function % Change -25.47 %   Socioeconomic Pre 20 %   Socioeconomic Post 21.71 %   Socioeconomic % Change  8.55 %   Psych/Spiritual Pre 20.29 %   Psych/Spiritual Post 24.86 %    Psych/Spiritual % Change 22.52 %   Family Pre 20.6 %   Family Post 12.63 %   Family % Change -38.69 %   GLOBAL Pre 20.32 %   GLOBAL Post 18.22 %   GLOBAL % Change -10.33 %      PHQ-9: Recent Review Flowsheet Data    [facility] 04/22/2017 01/13/2017 04/25/2016   Decreased Interest 0 0 0   Down, Depressed, Hopeless 0 0 0   PHQ - 2 Score 0 0 0   Altered sleeping 0 0 -   Tired, decreased energy 1 1 -   Change in appetite 0 0 -   Feeling bad or failure about yourself  0 1 -   Trouble concentrating 0 0 -   Moving slowly or fidgety/restless 0 0 -   Suicidal thoughts 0 0 -   PHQ-9 Score 1 2 -     Interpretation of Total Score  Total Score Depression Severity:  1-4 = Minimal depression, 5-9 = Mild depression, 10-14 = Moderate depression, 15-19 = Moderately severe depression, 20-27 = Severe depression   Psychosocial Evaluation and Intervention:     Psychosocial Evaluation - 04/22/17 1103      Discharge Psychosocial Assessment & Intervention   Comments Counselor met today with Jeremyah for discharge evaluation.  He reports progress in many areas since coming into this program, including: increased flexibility; increased muscle strength; and the ability to do more around the house and yard than before.  His mood has remained positive and he continues to sleep well and enjoy a healthy appetite.  He also had a goal to breathe better; but admits that is about the same.  Vester continues to manage his stress with a good sense of humor and exercise primarily.  He has plans to work out at the [employer]ollowing completion of this program.  Counselor commended Pelham on all his hard work and achieving his exercise program goals.  His presence and humor will be missed in this class.        Psychosocial Re-Evaluation:     Psychosocial Re-Evaluation    [place] Name 03/16/17 1455             Psychosocial Re-Evaluation   Comments Mr Evetts is very positive with his attitude in [facility]  class and makes the class laugh with jokes. He works hard at his exercise goals and has made some progress with increased stamina at home with activities at home. Mr Grenz attends regularly and participates in education.         Expected Outcomes Continue to progress with his exercise goals, enjoy the socialization in [place], and learn new knowledge to self-manage his COPD.          Psychosocial Discharge (Final Psychosocial Re-Evaluation):     Psychosocial Re-Evaluation - 03/16/17 1455      Psychosocial Re-Evaluation   Comments Mr Mineer is very positive with his attitude in [facility] class and makes the class laugh with jokes. He works hard at his exercise goals and has made some progress with increased stamina at home with activities at home. Mr Lucken attends regularly and participates in education.     Expected Outcomes Continue to progress with his exercise goals, enjoy the socialization in [place], and learn new knowledge to self-manage his COPD.      Education: Education Goals: Education classes will be provided on a weekly basis, covering required topics. Participant will state understanding/return demonstration of topics presented.  Learning Barriers/Preferences:   Education Topics: Initial Evaluation Education: - Verbal, written and demonstration of respiratory meds, RPE/PD scales, oximetry and breathing techniques. Instruction on use of nebulizers and MDIs: cleaning and proper use, rinsing mouth with steroid doses and importance of monitoring MDI activations.   Pulmonary Rehab from 04/01/2017 in [facility] Cardiac and Pulmonary Rehab  Date  01/13/17  Educator  LB  Instruction Review Code  2- meets goals/outcomes      General Nutrition Guidelines/Fats and Fiber: -Group instruction provided by verbal, written material, models and posters to present the general guidelines for heart healthy nutrition. Gives an explanation and review of dietary fats and fiber.   Pulmonary Rehab  from 04/01/2017 in [facility] Cardiac and Pulmonary Rehab  Date  02/23/17  Educator  CR  Instruction Review Code  2- meets goals/outcomes      Controlling Sodium/Reading Food Labels: -Group verbal and written material supporting the discussion of sodium use in heart healthy nutrition. Review and explanation with models, verbal and written materials for utilization of the food label.   Pulmonary Rehab from 04/01/2017 in [facility] Cardiac and Pulmonary Rehab  Date  03/02/17  Educator  CR  Instruction Review Code  2- meets goals/outcomes      Exercise Physiology & Risk Factors: - Group verbal and written instruction with models to review the exercise physiology of the cardiovascular system and associated critical values. Details cardiovascular disease risk factors and the goals associated with each risk factor.   Aerobic Exercise & Resistance Training: - Gives group verbal and written discussion on the health impact of inactivity. On the components of aerobic and resistive training programs and the benefits of this training and how to safely progress through these programs.   Flexibility, Balance, General Exercise Guidelines: - Provides group verbal and written instruction on the benefits of flexibility and balance training programs. Provides general exercise guidelines with  specific guidelines to those with heart or lung disease. Demonstration and skill practice provided.   Pulmonary Rehab from 04/01/2017 in [facility] Cardiac and Pulmonary Rehab  Date  03/27/17  Educator  Nada Maclachlan,  Instruction Review Code  2- meets goals/outcomes      Stress Management: - Provides group verbal and written instruction about the health risks of elevated stress, cause of high stress, and healthy ways to reduce stress.   Depression: - Provides group verbal and written instruction on the correlation between heart/lung disease and depressed mood, treatment options, and the stigmas associated with seeking treatment.    Pulmonary Rehab from 04/01/2017 in [facility] Cardiac and Pulmonary Rehab  Date  03/25/17  Educator  [facility]  Instruction Review Code  2- meets goals/outcomes      Exercise & Equipment Safety: - Individual verbal instruction and demonstration of equipment use and safety with use of the equipment.   Pulmonary Rehab from 04/01/2017 in [facility] Cardiac and Pulmonary Rehab  Date  01/26/17  Educator  AS  Instruction Review Code  2- meets goals/outcomes      Infection Prevention: - Provides verbal and written material to individual with discussion of infection control including proper hand washing and proper equipment cleaning during exercise session.   Pulmonary Rehab from 04/01/2017 in [facility] Cardiac and Pulmonary Rehab  Date  01/26/17  Educator  AS  Instruction Review Code  2- meets goals/outcomes      Falls Prevention: - Provides verbal and written material to individual with discussion of falls prevention and safety.   Pulmonary Rehab from 04/01/2017 in [facility] Cardiac and Pulmonary Rehab  Date  01/13/17  Educator  LB  Instruction Review Code  2- meets goals/outcomes      Diabetes: - Individual verbal and written instruction to review signs/symptoms of diabetes, desired ranges of glucose level fasting, after meals and with exercise. Advice that pre and post exercise glucose checks will be done for 3 sessions at entry of program.   Pulmonary Rehab from 04/01/2017 in [facility] Cardiac and Pulmonary Rehab  Date  03/13/17  Educator  CE  Instruction Review Code  2- meets goals/outcomes      Chronic Lung Diseases: - Group verbal and written instruction to review new updates, new respiratory medications, new advancements in procedures and treatments. Provide informative websites and "800" numbers of self-education.   Pulmonary Rehab from 04/01/2017 in [facility] Cardiac and Pulmonary Rehab  Date  02/04/17  Educator  LB  Instruction Review Code  2- meets goals/outcomes      Lung Procedures: - Group verbal and  written instruction to describe testing methods done to diagnose lung disease. Review the outcome of test results. Describe the treatment choices: Pulmonary Function Tests, ABGs and oximetry.   Energy Conservation: - Provide group verbal and written instruction for methods to conserve energy, plan and organize activities. Instruct on pacing techniques, use of adaptive equipment and posture/positioning to relieve shortness of breath.   Triggers: - Group verbal and written instruction to review types of environmental controls: home humidity, furnaces, filters, dust mite/pet prevention, HEPA vacuums. To discuss weather changes, air quality and the benefits of nasal washing.   Exacerbations: - Group verbal and written instruction to provide: warning signs, infection symptoms, calling MD promptly, preventive modes, and value of vaccinations. Review: effective airway clearance, coughing and/or vibration techniques. Create an Sports administrator.   Oxygen: - Individual and group verbal and written instruction on oxygen therapy. Includes supplement oxygen, available portable oxygen systems, continuous and intermittent  flow rates, oxygen safety, concentrators, and Medicare reimbursement for oxygen.   Pulmonary Rehab from 04/01/2017 in [facility] Cardiac and Pulmonary Rehab  Date  01/13/17  Educator  LB  Instruction Review Code  2- meets goals/outcomes      Respiratory Medications: - Group verbal and written instruction to review medications for lung disease. Drug class, frequency, complications, importance of spacers, rinsing mouth after steroid MDI's, and proper cleaning methods for nebulizers.   Pulmonary Rehab from 04/01/2017 in [facility] Cardiac and Pulmonary Rehab  Date  01/13/17  Educator  LB  Instruction Review Code  2- meets goals/outcomes      AED/CPR: - Group verbal and written instruction with the use of models to demonstrate the basic use of the AED with the basic ABC's of resuscitation.   Pulmonary  Rehab from 04/01/2017 in [facility] Cardiac and Pulmonary Rehab  Date  02/20/17  Educator  MA  Instruction Review Code  2- meets goals/outcomes      Breathing Retraining: - Provides individuals verbal and written instruction on purpose, frequency, and proper technique of diaphragmatic breathing and pursed-lipped breathing. Applies individual practice skills.   Pulmonary Rehab from 04/01/2017 in [facility] Cardiac and Pulmonary Rehab  Date  01/26/17  Educator  AS  Instruction Review Code  2- meets goals/outcomes      Anatomy and Physiology of the Lungs: - Group verbal and written instruction with the use of models to provide basic lung anatomy and physiology related to function, structure and complications of lung disease.   Pulmonary Rehab from 04/01/2017 in [facility] Cardiac and Pulmonary Rehab  Date  04/01/17  Educator  LB  Instruction Review Code  2- meets goals/outcomes      Heart Failure: - Group verbal and written instruction on the basics of heart failure: signs/symptoms, treatments, explanation of ejection fraction, enlarged heart and cardiomyopathy.   Sleep Apnea: - Individual verbal and written instruction to review Obstructive Sleep Apnea. Review of risk factors, methods for diagnosing and types of masks and machines for OSA.   Anxiety: - Provides group, verbal and written instruction on the correlation between heart/lung disease and anxiety, treatment options, and management of anxiety.   Pulmonary Rehab from 04/01/2017 in [facility] Cardiac and Pulmonary Rehab  Date  01/28/17  Educator  [facility]  Instruction Review Code  2- Meets goals/outcomes      Relaxation: - Provides group, verbal and written instruction about the benefits of relaxation for patients with heart/lung disease. Also provides patients with examples of relaxation techniques.   Pulmonary Rehab from 04/01/2017 in [facility] Cardiac and Pulmonary Rehab  Date  02/25/17  Educator  [facility]  Instruction Review Code  2- Meets goals/outcomes       Knowledge Questionnaire Score:     Knowledge Questionnaire Score - 04/20/17 1443      Knowledge Questionnaire Score   Post Score 9/10       Core Components/Risk Factors/Patient Goals at Admission:   Core Components/Risk Factors/Patient Goals Review:      Goals and Risk Factor Review    Row Name 03/16/17 1516 03/16/17 1519 04/01/17 1233         Core Components/Risk Factors/Patient Goals Review   Personal Goals Review Increase knowledge of respiratory medications and ability to use respiratory devices properly.;Improve shortness of breath with ADL's;Develop more efficient breathing techniques such as purse lipped breathing and diaphragmatic breathing and practicing self-pacing with activity.  - Improve shortness of breath with ADL's;Develop more efficient breathing techniques such as purse lipped breathing and diaphragmatic  breathing and practicing self-pacing with activity.     Review Mr Matsuo has completed 20 sessions in [place] and has progressed with his exercise goals. He improved his mid 70md by 576f   Mr Gi[place]mas [place] with [place] exercise goals. He improved his mid [id number] uses his oxygen as needed, especially if outside and develops shortness of breath. I encouraged him to use PLB.  Although his shortness of breath and stamina has not improved but slightly, he is interested in joining a gym after [facility]  and use his [employer]ended for his regular attendence and participation in [employer]. Channing is taking all meds as instructed.  He cannot tell a big difference in his ADLs but does use PLB "oncein a whilie" and it helps him.     Expected Outcomes  - Continue exercising and improving his goals and attend education regularly for self-management of his COPD.  -        Core Components/Risk Factors/Patient Goals at Discharge (Final Review):      Goals and Risk Factor Review  - 04/01/17 1233      Core Components/Risk Factors/Patient Goals Review   Personal Goals Review Improve shortness of breath with ADL's;Develop more efficient breathing techniques such as purse lipped breathing and diaphragmatic breathing and practicing self-pacing with activity.   Review Bhavik is taking all meds as instructed.  He cannot tell a big difference in his ADLs but does use PLB "oncein a whilie" and it helps him.      ITP Comments:     ITP Comments    Row Name 03/02/17 0758 03/30/17 0755         ITP Comments  30 day note review by Dr MarkEmily Filbertdical Director of Lung[facility] note review by Dr MarkEmily Filbertdical Director of [facility]         Comments: 30 day note review with Medical Director of [facility] Dr. MarkEmily Filbert

## 2017-04-27 NOTE — Progress Notes (Signed)
Daily Session Note  Patient Details  Name: DEONTRAY HUNNICUTT MRN: [id number] Date of Birth: [birth year] Referring Provider:     Pulmonary Rehab from 01/13/2017 in [facility] Cardiac and Pulmonary Rehab  Referring Provider  [facility]      Encounter Date: 04/27/2017  Check In:     Session Check In - 04/27/17 1013      Check-In   Location [facility]-Cardiac & Pulmonary Rehab   Staff Present Earlean Shawl, BS, ACSM CEP, Exercise Physiologist;Joseph Foy [place], BA, ACSM CEP, Exercise Physiologist;Laureen Janell Quiet, RRT, Respiratory Therapist   Supervising physician immediately available to respond to emergencies [facility] immediately available ER MD   Physician(s) Dr. Mable Paris and Dr. Corky Downs   Medication changes reported     No   Fall or balance concerns reported    No   Warm-up and Cool-down Performed as group-led instruction   Resistance Training Performed Yes   VAD Patient? No     Pain Assessment   Currently in Pain? No/denies   Multiple Pain Sites No         History  Smoking Status  . Former Smoker  . Types: Cigarettes  Smokeless Tobacco  . Never Used    Goals Met:  Proper associated with RPD/PD & O2 Sat Independence with exercise equipment Exercise tolerated well Strength training completed today  Goals Unmet:  Not Applicable  Comments: Pt able to follow exercise prescription today without complaint.  Will continue to monitor for progression.    Dr. Emily Filbert is Medical Director for [place] and [facility].

## 2017-05-01 ENCOUNTER — Encounter: Payer: Non-veteran care | Admitting: *Deleted

## 2017-05-01 DIAGNOSIS — J449 Chronic obstructive pulmonary disease, unspecified: Secondary | ICD-10-CM

## 2017-05-01 NOTE — Progress Notes (Signed)
Daily Session Note  Patient Details  Name: Devin Martinez MRN: [id number] Date of Birth: 12/20/1940 Referring Provider:     Pulmonary Rehab from 01/13/2017 in [facility] Cardiac and Pulmonary Rehab  Referring Provider  [facility]      Encounter Date: 05/01/2017  Check In:     Session Check In - 05/01/17 1038      Check-In   Location [facility]-Cardiac & Pulmonary Rehab   Staff Present Gerlene Burdock, RN, Vickki Hearing, BA, ACSM CEP, Exercise Physiologist;Joseph Flavia Shipper   Supervising physician immediately available to respond to emergencies [facility] immediately available ER MD   Physician(s) Dr. Jimmye Norman and Dr. Joni Fears   Medication changes reported     No   Fall or balance concerns reported    No   Tobacco Cessation No Change   Warm-up and Cool-down Performed as group-led instruction   Resistance Training Performed Yes   VAD Patient? No     Pain Assessment   Currently in Pain? No/denies         History  Smoking Status  . Former Smoker  . Types: Cigarettes  Smokeless Tobacco  . Never Used    Goals Met:  Proper associated with RPD/PD & O2 Sat Exercise tolerated well  Goals Unmet:  Not Applicable  Comments:     Dr. Emily Filbert is Medical Director for [place] and [facility].

## 2017-05-04 ENCOUNTER — Encounter: Payer: Non-veteran care | Admitting: *Deleted

## 2017-05-04 DIAGNOSIS — J449 Chronic obstructive pulmonary disease, unspecified: Secondary | ICD-10-CM

## 2017-05-04 NOTE — Progress Notes (Signed)
Daily Session Note  Patient Details  Name: Devin Martinez MRN: [id number] Date of Birth: 23-Jun-1941 Referring Provider:     Pulmonary Rehab from 01/13/2017 in [facility] Cardiac and Pulmonary Rehab  Referring Provider  [facility]      Encounter Date: 05/04/2017  Check In:     Session Check In - 05/04/17 1055      Check-In   Location [facility]-Cardiac & Pulmonary Rehab   Staff Present Earlean Shawl, BS, ACSM CEP, Exercise Physiologist;Joseph Christain [place], RN BSN   Supervising physician immediately available to respond to emergencies [facility] immediately available ER MD   Physician(s) Dr. Archie Balboa and Dr. Quentin Cornwall   Medication changes reported     No   Fall or balance concerns reported    No   Warm-up and Cool-down Performed as group-led instruction   Resistance Training Performed Yes   VAD Patient? No     Pain Assessment   Currently in Pain? No/denies   Multiple Pain Sites No         History  Smoking Status  . Former Smoker  . Types: Cigarettes  Smokeless Tobacco  . Never Used    Goals Met:  Proper associated with RPD/PD & O2 Sat Independence with exercise equipment Exercise tolerated well Strength training completed today  Goals Unmet:  Not Applicable  Comments: Pt able to follow exercise prescription today without complaint.  Will continue to monitor for progression.    Dr. Emily Filbert is Medical Director for [place] and [facility].

## 2017-05-06 DIAGNOSIS — J449 Chronic obstructive pulmonary disease, unspecified: Secondary | ICD-10-CM

## 2017-05-06 NOTE — Progress Notes (Signed)
Pulmonary Individual Treatment Plan  Patient Details  Name: RUBY DILONE MRN: [id number] Date of Birth: 06-Jul-1941 Referring Provider:     Pulmonary Rehab from 01/13/2017 in [facility] Cardiac and Pulmonary Rehab  Referring Provider  [place]      Initial Encounter Date:    Pulmonary Rehab from 01/13/2017 in [facility] Cardiac and Pulmonary Rehab  Date  01/13/17  Referring Provider  [facility]      Visit Diagnosis: Chronic obstructive pulmonary disease, unspecified COPD type ([place])  Patient's Home Medications on Admission:  Current Outpatient Prescriptions:  .  aspirin 81 MG tablet, Take 81 mg by mouth daily. Reported on 04/25/2016, Disp: , Rfl:  .  budesonide-formoterol (SYMBICORT) 160-4.5 MCG/ACT inhaler, Inhale 2 puffs into the lungs 2 (two) times daily., Disp: , Rfl:  .  enalapril-hydrochlorothiazide (VASERETIC) 10-25 MG per tablet, Take 1 tablet by mouth every other day. , Disp: , Rfl:  .  feeding supplement, ENSURE ENLIVE, (ENSURE ENLIVE) LIQD, Take 237 mLs by mouth 2 (two) times daily between meals., Disp: 237 mL, Rfl: 12 .  ipratropium-albuterol (DUONEB) 0.5-2.5 (3) MG/3ML SOLN, Take 3 mLs by nebulization every 6 (six) hours., Disp: 30 mL, Rfl: 1 .  Multiple Vitamin (MULTIVITAMIN) tablet, Take 1 tablet by mouth daily. Reported on 04/25/2016, Disp: , Rfl:  .  OVER THE COUNTER MEDICATION, Take 1 tablet by mouth daily as needed. Reported on 04/25/2016, Disp: , Rfl:  .  Red Yeast Rice Extract (RED YEAST RICE PO), Take 1 tablet by mouth daily.  , Disp: , Rfl:   Past Medical History: Past Medical History:  Diagnosis Date  . Asthma   . COPD (chronic obstructive pulmonary disease) ([place])   . Degenerative arthritis of left knee   . Diabetes mellitus    Type II  . Herpes   . Hyperlipidemia   . Hypertension   . LVH (left ventricular hypertrophy)    Hx. of  . Obesity   . Smoker     Tobacco Use: History  Smoking Status  . Former Smoker  . Types: Cigarettes  Smokeless Tobacco  . Never Used     Labs: Recent Review Flowsheet Data    Labs for ITP Cardiac and Pulmonary Rehab Latest Ref Rng & Units 03/16/2016 03/17/2016   Hemoglobin A1c 4.0 - 6.0 % - 5.8   PHART 7.350 - 7.450 7.40 -   PCO2ART 32.0 - 48.0 mmHg 45 -   HCO3 21.0 - 28.0 mEq/L 27.9 -   O2SAT % 91.4 -       ADL UCSD:     Pulmonary Assessment Scores    Row Name 01/13/17 1446 03/04/17 1453 04/20/17 1443     ADL UCSD   ADL Phase Entry Entry Exit   SOB Score total 24 30 44   Rest 0 0 1   Walk 1 0 2   Stairs '2 3 3   ' Bath '3 3 3   ' Dress '1 1 2   ' Shop '1 1 2      ' Pulmonary Function Assessment:     Pulmonary Function Assessment - 01/13/17 1445      Pulmonary Function Tests   RV% 108 %   DLCO% 55 %     Initial Spirometry Results   FVC% 52 %   FEV1% 33 %   FEV1/FVC Ratio 47   Comments Test Date 05/16/16     Post Bronchodilator Spirometry Results   FVC% 53 %   FEV1% 36 %   FEV1/FVC Ratio 49  Breath   Bilateral Breath Sounds Decreased;Wheezes;Expiratory   Shortness of Breath Yes;Limiting activity      Exercise Target Goals:    Exercise Program Goal: Individual exercise prescription set with THRR, safety & activity barriers. Participant demonstrates ability to understand and report RPE using BORG scale, to self-measure pulse accurately, and to acknowledge the importance of the exercise prescription.  Exercise Prescription Goal: Starting with aerobic activity 30 plus minutes a day, 3 days per week for initial exercise prescription. Provide home exercise prescription and guidelines that participant acknowledges understanding prior to discharge.  Activity Barriers & Risk Stratification:   6 Minute Walk:     6 Minute Walk    Row Name 01/13/17 1425 03/04/17 1317 04/22/17 1118     6 Minute Walk   Phase  - Mid Program Discharge   Distance 1214 feet 1267 feet 1315 feet   Distance % Change  -  - 8.3 %  101 ft   Walk Time 6 minutes 6 minutes 6 minutes   # of Rest Breaks 0 0 0   MPH 2.29  2.4 2.49   METS 2.27 2.7 3.04   RPE '12 13 13   ' Perceived Dyspnea  '2 3 3   ' VO2 Peak 7.95 9.5 10.64   Symptoms No No No   Resting HR 71 bpm 92 bpm 95 bpm   Resting BP 128/62 138/64 142/60   Max Ex. HR 97 bpm 125 bpm 122 bpm   Max Ex. BP 126/58 128/58 154/74   2 Minute Post BP  -  - 146/70     Interval HR   Baseline HR 71 92 95   1 Minute HR 81 103 121   2 Minute HR 90 102 116   3 Minute HR 95 103 119   4 Minute HR 96  - 122   5 Minute HR 97 104 122   6 Minute HR 96 125 113   2 Minute Post HR  - 97 97   Interval Heart Rate? Yes  - Yes     Interval Oxygen   Interval Oxygen? Yes  - Yes   Baseline Oxygen Saturation % 94 % 96 % 92 %   Baseline Liters of Oxygen  -  - 0 L  Room Air   1 Minute Oxygen Saturation % 93 % 91 % 91 %   1 Minute Liters of Oxygen  -  - 0 L   2 Minute Oxygen Saturation % 90 % 90 % 91 %   2 Minute Liters of Oxygen  -  - 0 L   3 Minute Oxygen Saturation % 91 % 90 % 91 %   3 Minute Liters of Oxygen  -  - 0 L   4 Minute Oxygen Saturation % 92 % 91 % 91 %   4 Minute Liters of Oxygen  -  - 0 L   5 Minute Oxygen Saturation % 92 % 91 % 91 %   5 Minute Liters of Oxygen  -  - 0 L   6 Minute Oxygen Saturation % 93 % 91 % 91 %   6 Minute Liters of Oxygen  -  - 0 L   2 Minute Post Oxygen Saturation %  - 94 % 94 %   2 Minute Post Liters of Oxygen  -  - 0 L     Oxygen Initial Assessment:     Oxygen Initial Assessment - 01/13/17 1448      Home Oxygen  Home Oxygen Device Portable Concentrator;Home Concentrator   Sleep Oxygen Prescription Continuous   Liters per minute 1.5  as needed   Home Exercise Oxygen Prescription Continuous   Liters per minute 1.5  as needed   Home at Rest Exercise Oxygen Prescription Continuous   Liters per minute 1.5  as needed     Initial 6 min Walk   Oxygen Used None     Program Oxygen Prescription   Program Oxygen Prescription Continuous;E-Tanks   Liters per minute 1.5   Comments as needed     Intervention   Short Term  Goals To learn and exhibit compliance with exercise, home and travel O2 prescription;To learn and understand importance of monitoring SPO2 with pulse oximeter and demonstrate accurate use of the pulse oximeter.;To Learn and understand importance of maintaining oxygen saturations>88%;To learn and demonstrate proper purse lipped breathing techniques or other breathing techniques.;To learn and demonstrate proper use of respiratory medications   Long  Term Goals Exhibits compliance with exercise, home and travel O2 prescription;Verbalizes importance of monitoring SPO2 with pulse oximeter and return demonstration;Maintenance of O2 saturations>88%;Exhibits proper breathing techniques, such as purse lipped breathing or other method taught during program session;Compliance with respiratory medication;Demonstrates proper use of MDI's      Oxygen Re-Evaluation:   Oxygen Discharge (Final Oxygen Re-Evaluation):   Initial Exercise Prescription:     Initial Exercise Prescription - 01/13/17 1400      Date of Initial Exercise RX and Referring Provider   Date 01/13/17   Referring Provider [facility]     Treadmill   MPH 2   Grade 0.5   Minutes 15     Bike   Level 1   Minutes 15   METs 2     T5 Nustep   Level 2   SPM 60   Minutes 15   METs 2     Prescription Details   Frequency (times per week) 3   Duration Progress to 45 minutes of aerobic exercise without signs/symptoms of physical distress     Intensity   THRR 40-80% of Max Heartrate 99-128   Ratings of Perceived Exertion 11-15   Perceived Dyspnea 0-4     Resistance Training   Training Prescription Yes   Weight 3   Reps 10-15      Perform Capillary Blood Glucose checks as needed.  Exercise Prescription Changes:     Exercise Prescription Changes    Row Name 01/26/17 1200 02/04/17 1300 02/18/17 1200 03/04/17 1300 03/18/17 1400     Response to Exercise   Blood Pressure (Admit) 128/70 138/68 124/64 138/64 142/82   Blood  Pressure (Exercise) 154/82 146/84 126/66 134/60 130/70   Blood Pressure (Exit) 112/70 120/60 126/66 122/58 126/64   Heart Rate (Admit) 83 bpm 73 bpm 80 bpm 92 bpm 82 bpm   Heart Rate (Exercise) 107 bpm 97 bpm 104 bpm 125 bpm 100 bpm   Heart Rate (Exit) 97 bpm 96 bpm 72 bpm 99 bpm 78 bpm   Oxygen Saturation (Admit) 95 % 95 % 100 % 96 % 94 %   Oxygen Saturation (Exercise) 94 % 97 % 96 % 90 % 90 %   Oxygen Saturation (Exit) 96 % 96 % 93 % 94 % 93 %   Rating of Perceived Exertion (Exercise) '13 13 12 13 12   ' Perceived Dyspnea (Exercise) '3 2 2 3 2   ' Duration  -  - Progress to 45 minutes of aerobic exercise without signs/symptoms of physical distress Progress to 45  minutes of aerobic exercise without signs/symptoms of physical distress Continue with 45 min of aerobic exercise without signs/symptoms of physical distress.   Intensity  -  - THRR unchanged THRR unchanged THRR New     Progression   Progression  -  - Continue to progress workloads to maintain intensity without signs/symptoms of physical distress. Continue to progress workloads to maintain intensity without signs/symptoms of physical distress. Continue to progress workloads to maintain intensity without signs/symptoms of physical distress.     Resistance Training   Training Prescription Yes Yes Yes Yes Yes   Weight '3 3 3 4 4   ' Reps 10-15 10-15 10-15 10-15 10-15     Treadmill   MPH 2 2 2.1 2.1 2   Grade 0.5 0.5 0.5 0.5 0.5   Minutes '15 15 15 15 15     ' Bike   Level 1  -  -  -  -   Minutes 15  -  -  -  -   METs 2  -  -  -  -     REL-XR   Level  -  -  - 3 4   Minutes  -  -  - 15 15   METs  -  -  - 4.1 3.3     T5 Nustep   Level '2 3 3 3 5   ' SPM 60 60 108 96 94   Minutes '15 15 15 15 15   ' METs 2 2 2.4 2.2 2.2   Row Name 04/01/17 1200 04/15/17 1200 04/28/17 1200         Response to Exercise   Blood Pressure (Admit) 126/74 118/66 160/70     Blood Pressure (Exercise) 126/64 162/76 138/62     Blood Pressure (Exit) 106/54 120/62  124/72     Heart Rate (Admit) 86 bpm 65 bpm 94 bpm     Heart Rate (Exercise) 105 bpm 98 bpm 116 bpm     Heart Rate (Exit) 82 bpm 87 bpm 102 bpm     Oxygen Saturation (Admit) 96 % 95 % 94 %     Oxygen Saturation (Exercise) 94 % 93 % 91 %     Oxygen Saturation (Exit) 94 % 92 % 91 %     Rating of Perceived Exertion (Exercise) '12 12 12     ' Perceived Dyspnea (Exercise) '3 3 3     ' Symptoms  -  - none     Duration Continue with 45 min of aerobic exercise without signs/symptoms of physical distress. Continue with 45 min of aerobic exercise without signs/symptoms of physical distress. Continue with 45 min of aerobic exercise without signs/symptoms of physical distress.     Intensity  - THRR unchanged THRR unchanged       Progression   Progression Continue to progress workloads to maintain intensity without signs/symptoms of physical distress. Continue to progress workloads to maintain intensity without signs/symptoms of physical distress. Continue to progress workloads to maintain intensity without signs/symptoms of physical distress.     Average METs  -  - 3.3       Resistance Training   Training Prescription Yes Yes Yes     Weight '4 4 4     ' Reps 10-15 10-15 10-15       Interval Training   Interval Training  -  - Yes     Equipment  -  - T5 Nustep       Treadmill   MPH 2.4 2.5 2.5  Grade 0.5 0.5 0.5     Minutes '15 15 15       ' REL-XR   Level  - 4 4     Minutes  - 15 15     METs  - 4.4 3.6       T5 Nustep   Level 3 4  -     SPM 94 110  -     Minutes 15 15  -     METs 2.2 2.4  -        Exercise Comments:     Exercise Comments    Row Name 01/26/17 1226 02/04/17 1331 02/18/17 1225 03/04/17 1316 03/18/17 1421   Exercise Comments  First full day of exercise!  Patient was oriented to gym and equipment including functions, settings, policies, and procedures.  Patient's individual exercise prescription and treatment plan were reviewed.  All starting workloads were established based on  the results of the 6 minute walk test done at initial orientation visit.  The plan for exercise progression was also introduced and progression will be customized based on patient's performance and goals Duglas is tolerating exercise well. Anthonee is doing well keeping his heart rate in proper training range. Demontae has increased to 4 lb on strength work and increased level on the XR. Jimie continues to progress well with exercise.   [place] Name 04/01/17 1240 04/15/17 1300         Exercise Comments Pheng is tolerating exercise well.  Staff will encourage him to add interval training. Staff will encourage Bonnie to work a little harder to reach his THR.         Exercise Goals and Review:   Exercise Goals Re-Evaluation :     Exercise Goals Re-Evaluation    Row Name 02/09/17 1225 04/22/17 1120 04/28/17 1249         Exercise Goal Re-Evaluation   Exercise Goals Review Increase Strenth and Stamina Increase Strenth and Stamina;Increase Physical Activity Increase Physical Activity;Increase Strenth and Stamina     Comments Patient reports that his legs are starting to feel stronger. Home exercise was reviewed with patient and he plans to investigate a possible gym membership at a community facility.  Alonso improved his post 6MWT by 173f!! Gerell has built back to previous levels after a cold.     Expected Outcomes Patient will investigate area gym memberships.   - Short - Ellias will finish the program  Long - Riggins will continue exercise at home        Discharge Exercise Prescription (Final Exercise Prescription Changes):     Exercise Prescription Changes - 04/28/17 1200      Response to Exercise   Blood Pressure (Admit) 160/70   Blood Pressure (Exercise) 138/62   Blood Pressure (Exit) 124/72   Heart Rate (Admit) 94 bpm   Heart Rate (Exercise) 116 bpm   Heart Rate (Exit) 102 bpm   Oxygen Saturation (Admit) 94 %   Oxygen Saturation (Exercise) 91 %   Oxygen Saturation (Exit) 91 %   Rating of Perceived  Exertion (Exercise) 12   Perceived Dyspnea (Exercise) 3   Symptoms none   Duration Continue with 45 min of aerobic exercise without signs/symptoms of physical distress.   Intensity THRR unchanged     Progression   Progression Continue to progress workloads to maintain intensity without signs/symptoms of physical distress.   Average METs 3.3     Resistance Training   Training Prescription Yes   Weight 4  Reps 10-15     Interval Training   Interval Training Yes   Equipment T5 Nustep     Treadmill   MPH 2.5   Grade 0.5   Minutes 15     REL-XR   Level 4   Minutes 15   METs 3.6      Nutrition:  Target Goals: Understanding of nutrition guidelines, daily intake of sodium <1563m, cholesterol <2080m calories 30% from fat and 7% or less from saturated fats, daily to have 5 or more servings of fruits and vegetables.  Biometrics:     Pre Biometrics - 01/13/17 1425      Pre Biometrics   Height '5\' 11"'  (1.803 m)   Weight 207 lb 9.6 oz (94.2 kg)   Waist Circumference 46 inches   Hip Circumference 44 inches   Waist to Hip Ratio 1.05 %   BMI (Calculated) 29       Nutrition Therapy Plan and Nutrition Goals:   Nutrition Discharge: Rate Your Plate Scores:   Nutrition Goals Re-Evaluation:   Nutrition Goals Discharge (Final Nutrition Goals Re-Evaluation):   Psychosocial: Target Goals: Acknowledge presence or absence of significant depression and/or stress, maximize coping skills, provide positive support system. Participant is able to verbalize types and ability to use techniques and skills needed for reducing stress and depression.   Initial Review & Psychosocial Screening:     Initial Psych Review & Screening - 01/13/17 [place]   Comments Mr GiAmbrosinias good support from his daughter, grandchildren, and his church family. He is looking forward to participating in [employer]    Barriers   Psychosocial barriers to  participate in program There are no identifiable barriers or psychosocial needs.;The patient should benefit from training in stress management and relaxation.     Screening Interventions   Interventions Encouraged to exercise      Quality of Life Scores:     Quality of Life - 04/20/17 1505      Quality of Life Scores   Health/Function Pre 20.38 %   Health/Function Post 15.19 %   Health/Function % Change -25.47 %   Socioeconomic Pre 20 %   Socioeconomic Post 21.71 %   Socioeconomic % Change  8.55 %   Psych/Spiritual Pre 20.29 %   Psych/Spiritual Post 24.86 %   Psych/Spiritual % Change 22.52 %   Family Pre 20.6 %   Family Post 12.63 %   Family % Change -38.69 %   GLOBAL Pre 20.32 %   GLOBAL Post 18.22 %   GLOBAL % Change -10.33 %      PHQ-9: Recent Review Flowsheet Data    [facility] 04/22/2017 01/13/2017 04/25/2016   Decreased Interest 0 0 0   Down, Depressed, Hopeless 0 0 0   PHQ - 2 Score 0 0 0   Altered sleeping 0 0 -   Tired, decreased energy 1 1 -   Change in appetite 0 0 -   Feeling bad or failure about yourself  0 1 -   Trouble concentrating 0 0 -   Moving slowly or fidgety/restless 0 0 -   Suicidal thoughts 0 0 -   PHQ-9 Score 1 2 -     Interpretation of Total Score  Total Score Depression Severity:  1-4 = Minimal depression, 5-9 = Mild depression, 10-14 = Moderate depression, 15-19 = Moderately severe depression, 20-27 = Severe depression   Psychosocial Evaluation and  Intervention:     Psychosocial Evaluation - 04/22/17 1103      Discharge Psychosocial Assessment & Intervention   Comments Counselor met today with Uziel for discharge evaluation.  He reports progress in many areas since coming into this program, including: increased flexibility; increased muscle strength; and the ability to do more around the house and yard than before.  His mood has remained positive and he continues to sleep well and enjoy a healthy appetite.  He also had a  goal to breathe better; but admits that is about the same.  [place] continues to manage his stress with a good sense of humor and exercise primarily.  He has plans to work out at the [employer] following completion of this program.  Counselor commended Tobe on all his hard work and achieving his exercise program goals.  His presence and humor will be missed in this class.        Psychosocial Re-Evaluation:     Psychosocial Re-Evaluation    [place] Name 03/16/17 1455             Psychosocial Re-Evaluation   Comments Mr Colden is very positive with his attitude in [facility] class and makes the class laugh with jokes. He works hard at his exercise goals and has made some progress with increased stamina at home with activities at home. Mr Viloria attends regularly and participates in education.         Expected Outcomes Continue to progress with his exercise goals, enjoy the socialization in [place], and learn new knowledge to self-manage his COPD.          Psychosocial Discharge (Final Psychosocial Re-Evaluation):     Psychosocial Re-Evaluation - 03/16/17 1455      Psychosocial Re-Evaluation   Comments Mr Tucholski is very positive with his attitude in [facility] class and makes the class laugh with jokes. He works hard at his exercise goals and has made some progress with increased stamina at home with activities at home. Mr Dieudonne attends regularly and participates in education.     Expected Outcomes Continue to progress with his exercise goals, enjoy the socialization in [place], and learn new knowledge to self-manage his COPD.      Education: Education Goals: Education classes will be provided on a weekly basis, covering required topics. Participant will state understanding/return demonstration of topics presented.  Learning Barriers/Preferences:     Learning Barriers/Preferences - 01/13/17 1444      Learning Barriers/Preferences   Learning Barriers None   Learning Preferences None       Education Topics: Initial Evaluation Education: - Verbal, written and demonstration of respiratory meds, RPE/PD scales, oximetry and breathing techniques. Instruction on use of nebulizers and MDIs: cleaning and proper use, rinsing mouth with steroid doses and importance of monitoring MDI activations.   Pulmonary Rehab from 04/01/2017 in [facility] Cardiac and Pulmonary Rehab  Date  01/13/17  Educator  LB  Instruction Review Code  2- meets goals/outcomes      General Nutrition Guidelines/Fats and Fiber: -Group instruction provided by verbal, written material, models and posters to present the general guidelines for heart healthy nutrition. Gives an explanation and review of dietary fats and fiber.   Pulmonary Rehab from 04/01/2017 in [facility] Cardiac and Pulmonary Rehab  Date  02/23/17  Educator  CR  Instruction Review Code  2- meets goals/outcomes      Controlling Sodium/Reading Food Labels: -Group verbal and written material supporting the discussion of sodium use in heart healthy nutrition. Review  and explanation with models, verbal and written materials for utilization of the food label.   Pulmonary Rehab from 04/01/2017 in [facility] Cardiac and Pulmonary Rehab  Date  03/02/17  Educator  CR  Instruction Review Code  2- meets goals/outcomes      Exercise Physiology & Risk Factors: - Group verbal and written instruction with models to review the exercise physiology of the cardiovascular system and associated critical values. Details cardiovascular disease risk factors and the goals associated with each risk factor.   Aerobic Exercise & Resistance Training: - Gives group verbal and written discussion on the health impact of inactivity. On the components of aerobic and resistive training programs and the benefits of this training and how to safely progress through these programs.   Flexibility, Balance, General Exercise Guidelines: - Provides group verbal and written instruction on the benefits  of flexibility and balance training programs. Provides general exercise guidelines with specific guidelines to those with heart or lung disease. Demonstration and skill practice provided.   Pulmonary Rehab from 04/01/2017 in [facility] Cardiac and Pulmonary Rehab  Date  03/27/17  Educator  Nada Maclachlan,  Instruction Review Code  2- meets goals/outcomes      Stress Management: - Provides group verbal and written instruction about the health risks of elevated stress, cause of high stress, and healthy ways to reduce stress.   Depression: - Provides group verbal and written instruction on the correlation between heart/lung disease and depressed mood, treatment options, and the stigmas associated with seeking treatment.   Pulmonary Rehab from 04/01/2017 in [facility] Cardiac and Pulmonary Rehab  Date  03/25/17  Educator  [facility]  Instruction Review Code  2- meets goals/outcomes      Exercise & Equipment Safety: - Individual verbal instruction and demonstration of equipment use and safety with use of the equipment.   Pulmonary Rehab from 04/01/2017 in [facility] Cardiac and Pulmonary Rehab  Date  01/26/17  Educator  AS  Instruction Review Code  2- meets goals/outcomes      Infection Prevention: - Provides verbal and written material to individual with discussion of infection control including proper hand washing and proper equipment cleaning during exercise session.   Pulmonary Rehab from 04/01/2017 in [facility] Cardiac and Pulmonary Rehab  Date  01/26/17  Educator  AS  Instruction Review Code  2- meets goals/outcomes      Falls Prevention: - Provides verbal and written material to individual with discussion of falls prevention and safety.   Pulmonary Rehab from 04/01/2017 in [facility] Cardiac and Pulmonary Rehab  Date  01/13/17  Educator  LB  Instruction Review Code  2- meets goals/outcomes      Diabetes: - Individual verbal and written instruction to review signs/symptoms of diabetes, desired ranges of glucose  level fasting, after meals and with exercise. Advice that pre and post exercise glucose checks will be done for 3 sessions at entry of program.   Pulmonary Rehab from 04/01/2017 in [facility] Cardiac and Pulmonary Rehab  Date  03/13/17  Educator  CE  Instruction Review Code  2- meets goals/outcomes      Chronic Lung Diseases: - Group verbal and written instruction to review new updates, new respiratory medications, new advancements in procedures and treatments. Provide informative websites and "800" numbers of self-education.   Pulmonary Rehab from 04/01/2017 in [facility] Cardiac and Pulmonary Rehab  Date  02/04/17  Educator  LB  Instruction Review Code  2- meets goals/outcomes      Lung Procedures: - Group verbal and written  instruction to describe testing methods done to diagnose lung disease. Review the outcome of test results. Describe the treatment choices: Pulmonary Function Tests, ABGs and oximetry.   Energy Conservation: - Provide group verbal and written instruction for methods to conserve energy, plan and organize activities. Instruct on pacing techniques, use of adaptive equipment and posture/positioning to relieve shortness of breath.   Triggers: - Group verbal and written instruction to review types of environmental controls: home humidity, furnaces, filters, dust mite/pet prevention, HEPA vacuums. To discuss weather changes, air quality and the benefits of nasal washing.   Exacerbations: - Group verbal and written instruction to provide: warning signs, infection symptoms, calling MD promptly, preventive modes, and value of vaccinations. Review: effective airway clearance, coughing and/or vibration techniques. Create an Sports administrator.   Oxygen: - Individual and group verbal and written instruction on oxygen therapy. Includes supplement oxygen, available portable oxygen systems, continuous and intermittent flow rates, oxygen safety, concentrators, and Medicare reimbursement for oxygen.    Pulmonary Rehab from 04/01/2017 in [facility] Cardiac and Pulmonary Rehab  Date  01/13/17  Educator  LB  Instruction Review Code  2- meets goals/outcomes      Respiratory Medications: - Group verbal and written instruction to review medications for lung disease. Drug class, frequency, complications, importance of spacers, rinsing mouth after steroid MDI's, and proper cleaning methods for nebulizers.   Pulmonary Rehab from 04/01/2017 in [facility] Cardiac and Pulmonary Rehab  Date  01/13/17  Educator  LB  Instruction Review Code  2- meets goals/outcomes      AED/CPR: - Group verbal and written instruction with the use of models to demonstrate the basic use of the AED with the basic ABC's of resuscitation.   Pulmonary Rehab from 04/01/2017 in [facility] Cardiac and Pulmonary Rehab  Date  02/20/17  Educator  MA  Instruction Review Code  2- meets goals/outcomes      Breathing Retraining: - Provides individuals verbal and written instruction on purpose, frequency, and proper technique of diaphragmatic breathing and pursed-lipped breathing. Applies individual practice skills.   Pulmonary Rehab from 04/01/2017 in [facility] Cardiac and Pulmonary Rehab  Date  01/26/17  Educator  AS  Instruction Review Code  2- meets goals/outcomes      Anatomy and Physiology of the Lungs: - Group verbal and written instruction with the use of models to provide basic lung anatomy and physiology related to function, structure and complications of lung disease.   Pulmonary Rehab from 04/01/2017 in [facility] Cardiac and Pulmonary Rehab  Date  04/01/17  Educator  LB  Instruction Review Code  2- meets goals/outcomes      Heart Failure: - Group verbal and written instruction on the basics of heart failure: signs/symptoms, treatments, explanation of ejection fraction, enlarged heart and cardiomyopathy.   Sleep Apnea: - Individual verbal and written instruction to review Obstructive Sleep Apnea. Review of risk factors, methods for  diagnosing and types of masks and machines for OSA.   Anxiety: - Provides group, verbal and written instruction on the correlation between heart/lung disease and anxiety, treatment options, and management of anxiety.   Pulmonary Rehab from 04/01/2017 in [facility] Cardiac and Pulmonary Rehab  Date  01/28/17  Educator  [facility]  Instruction Review Code  2- Meets goals/outcomes      Relaxation: - Provides group, verbal and written instruction about the benefits of relaxation for patients with heart/lung disease. Also provides patients with examples of relaxation techniques.   Pulmonary Rehab from 04/01/2017 in [facility] Cardiac and Pulmonary Rehab  Date  02/25/17  Educator  Nash  Instruction Review Code  2- Meets goals/outcomes      Knowledge Questionnaire Score:     Knowledge Questionnaire Score - 04/20/17 1443      Knowledge Questionnaire Score   Post Score 9/10       Core Components/Risk Factors/Patient Goals at Admission:     Personal Goals and Risk Factors at Admission - 01/13/17 1451      Core Components/Risk Factors/Patient Goals on Admission   Improve shortness of breath with ADL's Yes   Intervention Provide education, individualized exercise plan and daily activity instruction to help decrease symptoms of SOB with activities of daily living.   Expected Outcomes Short Term: Achieves a reduction of symptoms when performing activities of daily living.   Develop more efficient breathing techniques such as purse lipped breathing and diaphragmatic breathing; and practicing self-pacing with activity Yes   Intervention Provide education, demonstration and support about specific breathing techniuqes utilized for more efficient breathing. Include techniques such as pursed lipped breathing, diaphragmatic breathing and self-pacing activity.   Expected Outcomes Short Term: Participant will be able to demonstrate and use breathing techniques as needed throughout daily activities.   Increase knowledge of  respiratory medications and ability to use respiratory devices properly  Yes  Spiriva, Symbicort, SVN Albuterol; spacer given   Intervention Provide education and demonstration as needed of appropriate use of medications, inhalers, and oxygen therapy.   Expected Outcomes Short Term: Achieves understanding of medications use. Understands that oxygen is a medication prescribed by physician. Demonstrates appropriate use of inhaler and oxygen therapy.      Core Components/Risk Factors/Patient Goals Review:      Goals and Risk Factor Review    Row Name 01/26/17 1222 02/09/17 1227 03/16/17 1516 03/16/17 1519 04/01/17 1233     Core Components/Risk Factors/Patient Goals Review   Personal Goals Review Develop more efficient breathing techniques such as purse lipped breathing and diaphragmatic breathing and practicing self-pacing with activity. Improve shortness of breath with ADL's;Develop more efficient breathing techniques such as purse lipped breathing and diaphragmatic breathing and practicing self-pacing with activity. Increase knowledge of respiratory medications and ability to use respiratory devices properly.;Improve shortness of breath with ADL's;Develop more efficient breathing techniques such as purse lipped breathing and diaphragmatic breathing and practicing self-pacing with activity.  - Improve shortness of breath with ADL's;Develop more efficient breathing techniques such as purse lipped breathing and diaphragmatic breathing and practicing self-pacing with activity.   Review Pursed lip breathing was discusses with patient and he demonstrated understanding on how and when to use this breathing technique to help control SOB and acceptable oxygen saturations.  Patient reported that he uses PLB when needed to help control SOB during ADL's and exercise.  Mr Dirk has completed 20 sessions in [place] and has progressed with his exercise goals. He improved his mid 52md by 547f   Mr GiSummonsas  [place] has progressed with his exercise goals. He improved his mid 28m52mby [id number] uses his oxygen as needed, especially if outside and develops shortness of breath. I encouraged him to use PLB.  Although his shortness of breath and stamina has not improved but slightly, he is interested in joining a gym after [facility]  and use his [employer] for his regular attendence and participation in [employer]. Ibrahem is taking all meds as instructed.  He cannot tell a big difference in his ADLs but does  use PLB "oncein a whilie" and it helps him.   Expected Outcomes Patient will use PLB during exercise and ADL's to aid in improving SOB and SaO2.  Patient will continue to attend class regularly and use PLB as needed.   - Continue exercising and improving his goals and attend education regularly for self-management of his COPD.  -      Core Components/Risk Factors/Patient Goals at Discharge (Final Review):      Goals and Risk Factor Review - 04/01/17 1233      Core Components/Risk Factors/Patient Goals Review   Personal Goals Review Improve shortness of breath with ADL's;Develop more efficient breathing techniques such as purse lipped breathing and diaphragmatic breathing and practicing self-pacing with activity.   Review Travaris is taking all meds as instructed.  He cannot tell a big difference in his ADLs but does use PLB "oncein a whilie" and it helps him.      ITP Comments:     ITP Comments    Row Name 03/30/17 0755 04/27/17 0736         ITP Comments 30 day note review by Dr Emily Filbert, Medical Director of [facility] 30 day note review with Medical Director of [facility] Dr. Emily Filbert          Comments: Discharge ITP

## 2017-05-06 NOTE — Progress Notes (Signed)
Daily Session Note  Patient Details  Name: Devin Martinez MRN: [id number] Date of Birth: [birth year] Referring Provider:     Pulmonary Rehab from 01/13/2017 in [facility] Cardiac and Pulmonary Rehab  Referring Provider  [facility]      Encounter Date: 05/06/2017  Check In:     Session Check In - 05/06/17 1030      Check-In   Location [facility]-Cardiac & Pulmonary Rehab   Staff Present Nyoka Cowden, RN, BSN, MA;Carmilla Granville RCP,RRT,BSRT;Amanda Oletta Darter, [place], ACSM CEP, Exercise Physiologist   Supervising physician immediately available to respond to emergencies [facility] immediately available ER MD   Physician(s) Dr. Kerman Passey and Quentin Cornwall   Medication changes reported     No   Fall or balance concerns reported    No   Tobacco Cessation No Change   Warm-up and Cool-down Performed as group-led instruction   Resistance Training Performed Yes   VAD Patient? No     Pain Assessment   Currently in Pain? No/denies   Multiple Pain Sites No         History  Smoking Status  . Former Smoker  . Types: Cigarettes  Smokeless Tobacco  . Never Used    Goals Met:  Proper associated with RPD/PD & O2 Sat Independence with exercise equipment Exercise tolerated well Strength training completed today  Goals Unmet:  Not Applicable  Comments:  Devin Martinez graduated today from Pulmonary rehab with 36 sessions completed.  Details of the patient's exercise prescription and what He needs to do in order to continue the prescription and progress were discussed with patient.  Patient was given a copy of prescription and goals.  Patient verbalized understanding.  Devin Martinez plans to continue to exercise by going to [facility] fitness center..   Dr. Emily Filbert is Medical Director for [place] and [facility].

## 2017-05-06 NOTE — Progress Notes (Signed)
Discharge Summary  Patient Details  Name: Devin Martinez MRN: [id number] Date of Birth: Apr 12, 1941 Referring Provider:     Pulmonary Rehab from 01/13/2017 in [facility] Cardiac and Pulmonary Rehab  Referring Provider  [place]       Number of Visits: 1  Reason for Discharge:  Patient reached a stable level of exercise. Patient independent in their exercise.  Smoking History:  History  Smoking Status  . Former Smoker  . Types: Cigarettes  Smokeless Tobacco  . Never Used    Diagnosis:  Chronic obstructive pulmonary disease, unspecified COPD type ([place])  ADL UCSD:     Pulmonary Assessment Scores    Row Name 01/13/17 1446 03/04/17 1453 04/20/17 1443     ADL UCSD   ADL Phase Entry Entry Exit   SOB Score total 24 30 44   Rest 0 0 1   Walk 1 0 2   Stairs 2 3 3    Bath 3 3 3    Dress 1 1 2    Shop 1 1 2       Initial Exercise Prescription:     Initial Exercise Prescription - 01/13/17 1400      Date of Initial Exercise RX and Referring Provider   Date 01/13/17   Referring Provider [facility]     Treadmill   MPH 2   Grade 0.5   Minutes 15     Bike   Level 1   Minutes 15   METs 2     T5 Nustep   Level 2   SPM 60   Minutes 15   METs 2     Prescription Details   Frequency (times per week) 3   Duration Progress to 45 minutes of aerobic exercise without signs/symptoms of physical distress     Intensity   THRR 40-80% of Max Heartrate 99-128   Ratings of Perceived Exertion 11-15   Perceived Dyspnea 0-4     Resistance Training   Training Prescription Yes   Weight 3   Reps 10-15      Discharge Exercise Prescription (Final Exercise Prescription Changes):     Exercise Prescription Changes - 04/28/17 1200      Response to Exercise   Blood Pressure (Admit) 160/70   Blood Pressure (Exercise) 138/62   Blood Pressure (Exit) 124/72   Heart Rate (Admit) 94 bpm   Heart Rate (Exercise) 116 bpm   Heart Rate (Exit) 102 bpm   Oxygen Saturation (Admit) 94 %   Oxygen  Saturation (Exercise) 91 %   Oxygen Saturation (Exit) 91 %   Rating of Perceived Exertion (Exercise) 12   Perceived Dyspnea (Exercise) 3   Symptoms none   Duration Continue with 45 min of aerobic exercise without signs/symptoms of physical distress.   Intensity THRR unchanged     Progression   Progression Continue to progress workloads to maintain intensity without signs/symptoms of physical distress.   Average METs 3.3     Resistance Training   Training Prescription Yes   Weight 4   Reps 10-15     Interval Training   Interval Training Yes   Equipment T5 Nustep     Treadmill   MPH 2.5   Grade 0.5   Minutes 15     REL-XR   Level 4   Minutes 15   METs 3.6      Functional Capacity:     6 Minute Walk    Row Name 01/13/17 1425 03/04/17 1317 04/22/17 1118     6 Minute  Walk   Phase  - Mid Program Discharge   Distance 1214 feet 1267 feet 1315 feet   Distance % Change  -  - 8.3 %  101 ft   Walk Time 6 minutes 6 minutes 6 minutes   # of Rest Breaks 0 0 0   MPH 2.29 2.4 2.49   METS 2.27 2.7 3.04   RPE 12 13 13    Perceived Dyspnea  2 3 3    VO2 Peak 7.95 9.5 10.64   Symptoms No No No   Resting HR 71 bpm 92 bpm 95 bpm   Resting BP 128/62 138/64 142/60   Max Ex. HR 97 bpm 125 bpm 122 bpm   Max Ex. BP 126/58 128/58 154/74   2 Minute Post BP  -  - 146/70     Interval HR   Baseline HR 71 92 95   1 Minute HR 81 103 121   2 Minute HR 90 102 116   3 Minute HR 95 103 119   4 Minute HR 96  - 122   5 Minute HR 97 104 122   6 Minute HR 96 125 113   2 Minute Post HR  - 97 97   Interval Heart Rate? Yes  - Yes     Interval Oxygen   Interval Oxygen? Yes  - Yes   Baseline Oxygen Saturation % 94 % 96 % 92 %   Baseline Liters of Oxygen  -  - 0 L  Room Air   1 Minute Oxygen Saturation % 93 % 91 % 91 %   1 Minute Liters of Oxygen  -  - 0 L   2 Minute Oxygen Saturation % 90 % 90 % 91 %   2 Minute Liters of Oxygen  -  - 0 L   3 Minute Oxygen Saturation % 91 % 90 % 91 %   3  Minute Liters of Oxygen  -  - 0 L   4 Minute Oxygen Saturation % 92 % 91 % 91 %   4 Minute Liters of Oxygen  -  - 0 L   5 Minute Oxygen Saturation % 92 % 91 % 91 %   5 Minute Liters of Oxygen  -  - 0 L   6 Minute Oxygen Saturation % 93 % 91 % 91 %   6 Minute Liters of Oxygen  -  - 0 L   2 Minute Post Oxygen Saturation %  - 94 % 94 %   2 Minute Post Liters of Oxygen  -  - 0 L      Psychological, QOL, Others - Outcomes: PHQ 2/9: Depression screen [facility] 2/9 04/22/2017 01/13/2017 04/25/2016  Decreased Interest 0 0 0  Down, Depressed, Hopeless 0 0 0  PHQ - 2 Score 0 0 0  Altered sleeping 0 0 -  Tired, decreased energy 1 1 -  Change in appetite 0 0 -  Feeling bad or failure about yourself  0 1 -  Trouble concentrating 0 0 -  Moving slowly or fidgety/restless 0 0 -  Suicidal thoughts 0 0 -  PHQ-9 Score 1 2 -    Quality of Life:     Quality of Life - 04/20/17 1505      Quality of Life Scores   Health/Function Pre 20.38 %   Health/Function Post 15.19 %   Health/Function % Change -25.47 %   Socioeconomic Pre 20 %   Socioeconomic Post 21.71 %   Socioeconomic %  Change  8.55 %   Psych/Spiritual Pre 20.29 %   Psych/Spiritual Post 24.86 %   Psych/Spiritual % Change 22.52 %   Family Pre 20.6 %   Family Post 12.63 %   Family % Change -38.69 %   GLOBAL Pre 20.32 %   GLOBAL Post 18.22 %   GLOBAL % Change -10.33 %      Personal Goals: Goals established at orientation with interventions provided to work toward goal.     Personal Goals and Risk Factors at Admission - 01/13/17 1451      Core Components/Risk Factors/Patient Goals on Admission   Improve shortness of breath with ADL's Yes   Intervention Provide education, individualized exercise plan and daily activity instruction to help decrease symptoms of SOB with activities of daily living.   Expected Outcomes Short Term: Achieves a reduction of symptoms when performing activities of daily living.   Develop more efficient  breathing techniques such as purse lipped breathing and diaphragmatic breathing; and practicing self-pacing with activity Yes   Intervention Provide education, demonstration and support about specific breathing techniuqes utilized for more efficient breathing. Include techniques such as pursed lipped breathing, diaphragmatic breathing and self-pacing activity.   Expected Outcomes Short Term: Participant will be able to demonstrate and use breathing techniques as needed throughout daily activities.   Increase knowledge of respiratory medications and ability to use respiratory devices properly  Yes  Spiriva, Symbicort, SVN Albuterol; spacer given   Intervention Provide education and demonstration as needed of appropriate use of medications, inhalers, and oxygen therapy.   Expected Outcomes Short Term: Achieves understanding of medications use. Understands that oxygen is a medication prescribed by physician. Demonstrates appropriate use of inhaler and oxygen therapy.       Personal Goals Discharge:     Goals and Risk Factor Review    Row Name 01/26/17 1222 02/09/17 1227 03/16/17 1516 03/16/17 1519 04/01/17 1233     Core Components/Risk Factors/Patient Goals Review   Personal Goals Review Develop more efficient breathing techniques such as purse lipped breathing and diaphragmatic breathing and practicing self-pacing with activity. Improve shortness of breath with ADL's;Develop more efficient breathing techniques such as purse lipped breathing and diaphragmatic breathing and practicing self-pacing with activity. Increase knowledge of respiratory medications and ability to use respiratory devices properly.;Improve shortness of breath with ADL's;Develop more efficient breathing techniques such as purse lipped breathing and diaphragmatic breathing and practicing self-pacing with activity.  - Improve shortness of breath with ADL's;Develop more efficient breathing techniques such as purse lipped breathing and  diaphragmatic breathing and practicing self-pacing with activity.   Review Pursed lip breathing was discusses with patient and he demonstrated understanding on how and when to use this breathing technique to help control SOB and acceptable oxygen saturations.  Patient reported that he uses PLB when needed to help control SOB during ADL's and exercise.  Mr Langenbach has completed 20 sessions in [place] and has progressed with his exercise goals. He improved his mid 61mwd by 66ft.   Mr Fontenette has completed 20 sessions in [place] and has progressed with his exercise goals. He improved his mid 22mwd by 18ft.  He uses his oxygen as needed, especially if outside and develops shortness of breath. I encouraged him to use PLB.  Although his shortness of breath and stamina has not improved but slightly, he is interested in joining a gym after [facility]  and use his Silver [employer]. Mr Corman is commended for his regular attendence and participation in [employer] education.  Osborne is taking all meds as instructed.  He cannot tell a big difference in his ADLs but does use PLB "oncein a whilie" and it helps him.   Expected Outcomes Patient will use PLB during exercise and ADL's to aid in improving SOB and SaO2.  Patient will continue to attend class regularly and use PLB as needed.   - Continue exercising and improving his goals and attend education regularly for self-management of his COPD.  -      Nutrition & Weight - Outcomes:     Pre Biometrics - 01/13/17 1425      Pre Biometrics   Height 5\' 11"  (1.803 m)   Weight 207 lb 9.6 oz (94.2 kg)   Waist Circumference 46 inches   Hip Circumference 44 inches   Waist to Hip Ratio 1.05 %   BMI (Calculated) 29       Nutrition:   Nutrition Discharge:   Education Questionnaire Score:     Knowledge Questionnaire Score - 04/20/17 1443      Knowledge Questionnaire Score   Post Score 9/10      Goals reviewed with patient; copy given to patient.

## 2017-05-14 DIAGNOSIS — J441 Chronic obstructive pulmonary disease with (acute) exacerbation: Secondary | ICD-10-CM | POA: Diagnosis not present

## 2017-05-18 ENCOUNTER — Ambulatory Visit (INDEPENDENT_AMBULATORY_CARE_PROVIDER_SITE_OTHER): Payer: PPO | Admitting: Pulmonary Disease

## 2017-05-18 ENCOUNTER — Encounter: Payer: Self-pay | Admitting: Pulmonary Disease

## 2017-05-18 VITALS — BP 134/74 | HR 80 | Ht 71.0 in | Wt 196.0 lb

## 2017-05-18 DIAGNOSIS — J449 Chronic obstructive pulmonary disease, unspecified: Secondary | ICD-10-CM | POA: Diagnosis not present

## 2017-05-18 DIAGNOSIS — M546 Pain in thoracic spine: Secondary | ICD-10-CM

## 2017-05-18 DIAGNOSIS — Z77090 Contact with and (suspected) exposure to asbestos: Secondary | ICD-10-CM

## 2017-05-18 DIAGNOSIS — J8410 Pulmonary fibrosis, unspecified: Secondary | ICD-10-CM | POA: Diagnosis not present

## 2017-05-18 NOTE — Progress Notes (Signed)
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 Remains on Symbicort. Continues to Use Duoneb 2-3 times per day. Completed pulmonary rehab and feels that it was beneficial. Participating in the follow up program. Using oxygen intermittently with exertion. Denies CP, fever, purulent sputum, hemoptysis, LE edema and calf tenderness. Here to review PFTs and CXR   OBJ: Vitals:   05/18/17 1553  BP: 134/74  Pulse: 80  SpO2: 94%  Weight: 196 lb (88.9 kg)  Height: 5\' 11"  (1.803 m)  RA  Pleasant, NAD HEENT WNL No JVD Mildly decreased BS, faint bibasilar crackles, few scattered wheezes RRR s M NABS, soft R>L 1+ ankle edema   DATA: CXR 05/19/17: NSC    IMPRESSION: 1) Severe COPD 2) Asbestos exposure without definitive evidence of asbestosis or pleural disease 3) Hypoxemia. SpO2 93% @ rest today    PLAN: - Continue Symbicort as maintenance medications - Continue albuterol as rescue medication - CXR ordered for today and reviewed above - Follow up in 6 months or as needed  Merton Border, MD PCCM service Mobile [phone] Pager [phone] 05/18/2017 9:40 PM

## 2017-05-18 NOTE — Patient Instructions (Addendum)
Continue Symbicort inhaler Continue albuterol as needed Chest x-ray today Follow-up in 6 months or sooner as needed

## 2017-05-19 ENCOUNTER — Ambulatory Visit
Admission: RE | Admit: 2017-05-19 | Discharge: 2017-05-19 | Disposition: A | Discharge status: Home / Self Care | Payer: PPO | Source: Ambulatory Visit | Attending: Pulmonary Disease | Admitting: Pulmonary Disease

## 2017-05-19 DIAGNOSIS — J8410 Pulmonary fibrosis, unspecified: Secondary | ICD-10-CM | POA: Diagnosis not present

## 2017-05-19 DIAGNOSIS — J449 Chronic obstructive pulmonary disease, unspecified: Secondary | ICD-10-CM | POA: Insufficient documentation

## 2017-05-21 ENCOUNTER — Telehealth: Payer: Self-pay | Admitting: Pulmonary Disease

## 2017-05-21 NOTE — Telephone Encounter (Signed)
[facility].  Requested via fax. With ROI .

## 2017-06-14 DIAGNOSIS — J441 Chronic obstructive pulmonary disease with (acute) exacerbation: Secondary | ICD-10-CM | POA: Diagnosis not present

## 2017-07-15 DIAGNOSIS — J441 Chronic obstructive pulmonary disease with (acute) exacerbation: Secondary | ICD-10-CM | POA: Diagnosis not present

## 2017-08-14 DIAGNOSIS — J441 Chronic obstructive pulmonary disease with (acute) exacerbation: Secondary | ICD-10-CM | POA: Diagnosis not present

## 2017-09-09 DIAGNOSIS — E782 Mixed hyperlipidemia: Secondary | ICD-10-CM | POA: Diagnosis not present

## 2017-09-09 DIAGNOSIS — R7303 Prediabetes: Secondary | ICD-10-CM | POA: Diagnosis not present

## 2017-09-09 DIAGNOSIS — I10 Essential (primary) hypertension: Secondary | ICD-10-CM | POA: Diagnosis not present

## 2017-09-14 DIAGNOSIS — J441 Chronic obstructive pulmonary disease with (acute) exacerbation: Secondary | ICD-10-CM | POA: Diagnosis not present

## 2017-09-16 DIAGNOSIS — R7303 Prediabetes: Secondary | ICD-10-CM | POA: Diagnosis not present

## 2017-09-16 DIAGNOSIS — E782 Mixed hyperlipidemia: Secondary | ICD-10-CM | POA: Diagnosis not present

## 2017-09-16 DIAGNOSIS — H6123 Impacted cerumen, bilateral: Secondary | ICD-10-CM | POA: Diagnosis not present

## 2017-09-16 DIAGNOSIS — H6121 Impacted cerumen, right ear: Secondary | ICD-10-CM | POA: Diagnosis not present

## 2017-09-16 DIAGNOSIS — Z0000 Encounter for general adult medical examination without abnormal findings: Secondary | ICD-10-CM | POA: Diagnosis not present

## 2017-09-16 DIAGNOSIS — I10 Essential (primary) hypertension: Secondary | ICD-10-CM | POA: Diagnosis not present

## 2017-10-14 DIAGNOSIS — J441 Chronic obstructive pulmonary disease with (acute) exacerbation: Secondary | ICD-10-CM | POA: Diagnosis not present

## 2017-11-14 DIAGNOSIS — J441 Chronic obstructive pulmonary disease with (acute) exacerbation: Secondary | ICD-10-CM | POA: Diagnosis not present

## 2017-12-15 DIAGNOSIS — J441 Chronic obstructive pulmonary disease with (acute) exacerbation: Secondary | ICD-10-CM | POA: Diagnosis not present

## 2017-12-27 IMAGING — CR DG CHEST 2V
1 series · 2 of 2 positions shown · non-contrast
Comparison: 05/16/2016

CLINICAL DATA: Follow-up pulmonary fibrosis

EXAM:
CHEST  2 VIEW

[Series 1: w chest pa · 0.14mm/px · 2 of 2 slices shown]
[im 1/2]
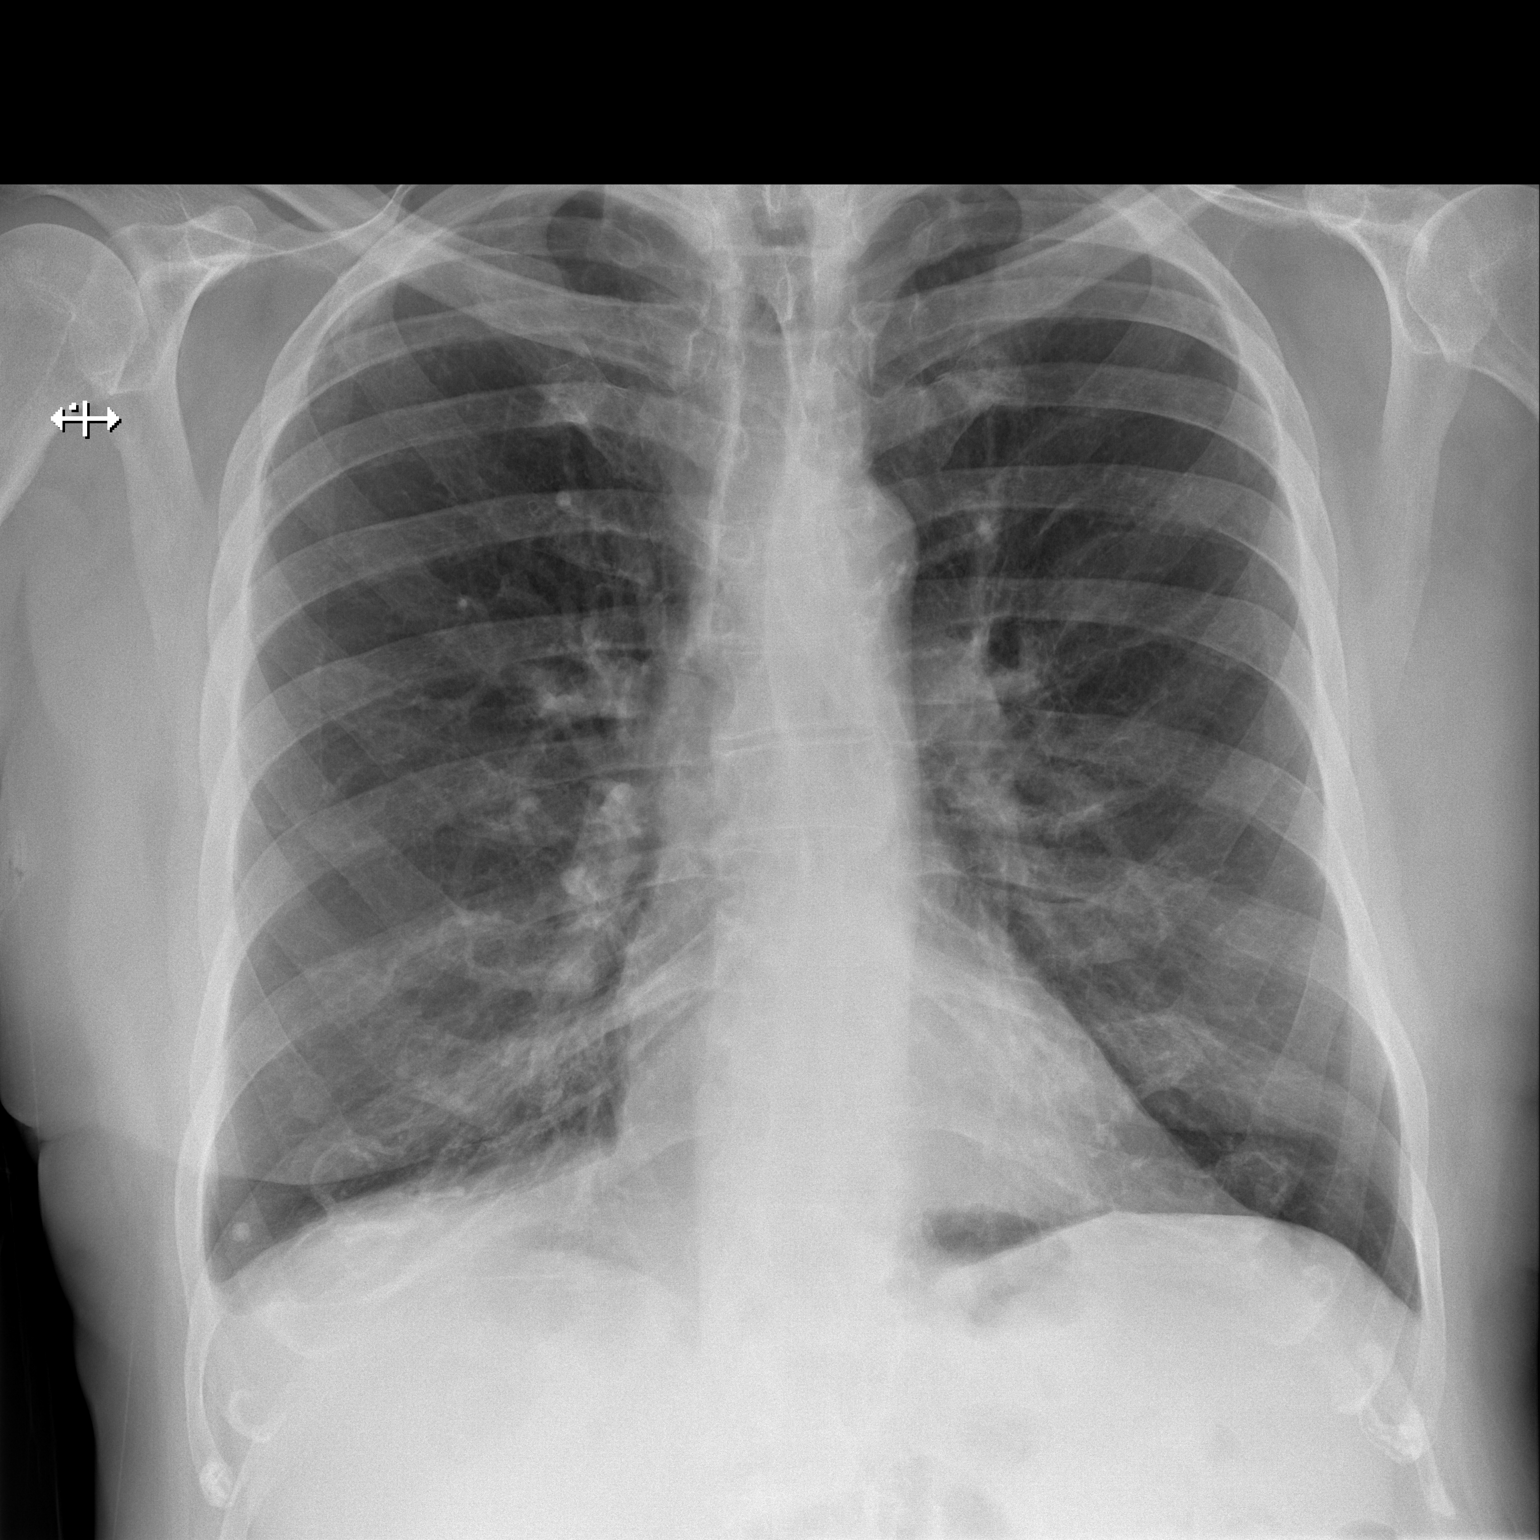
[im 2/2]
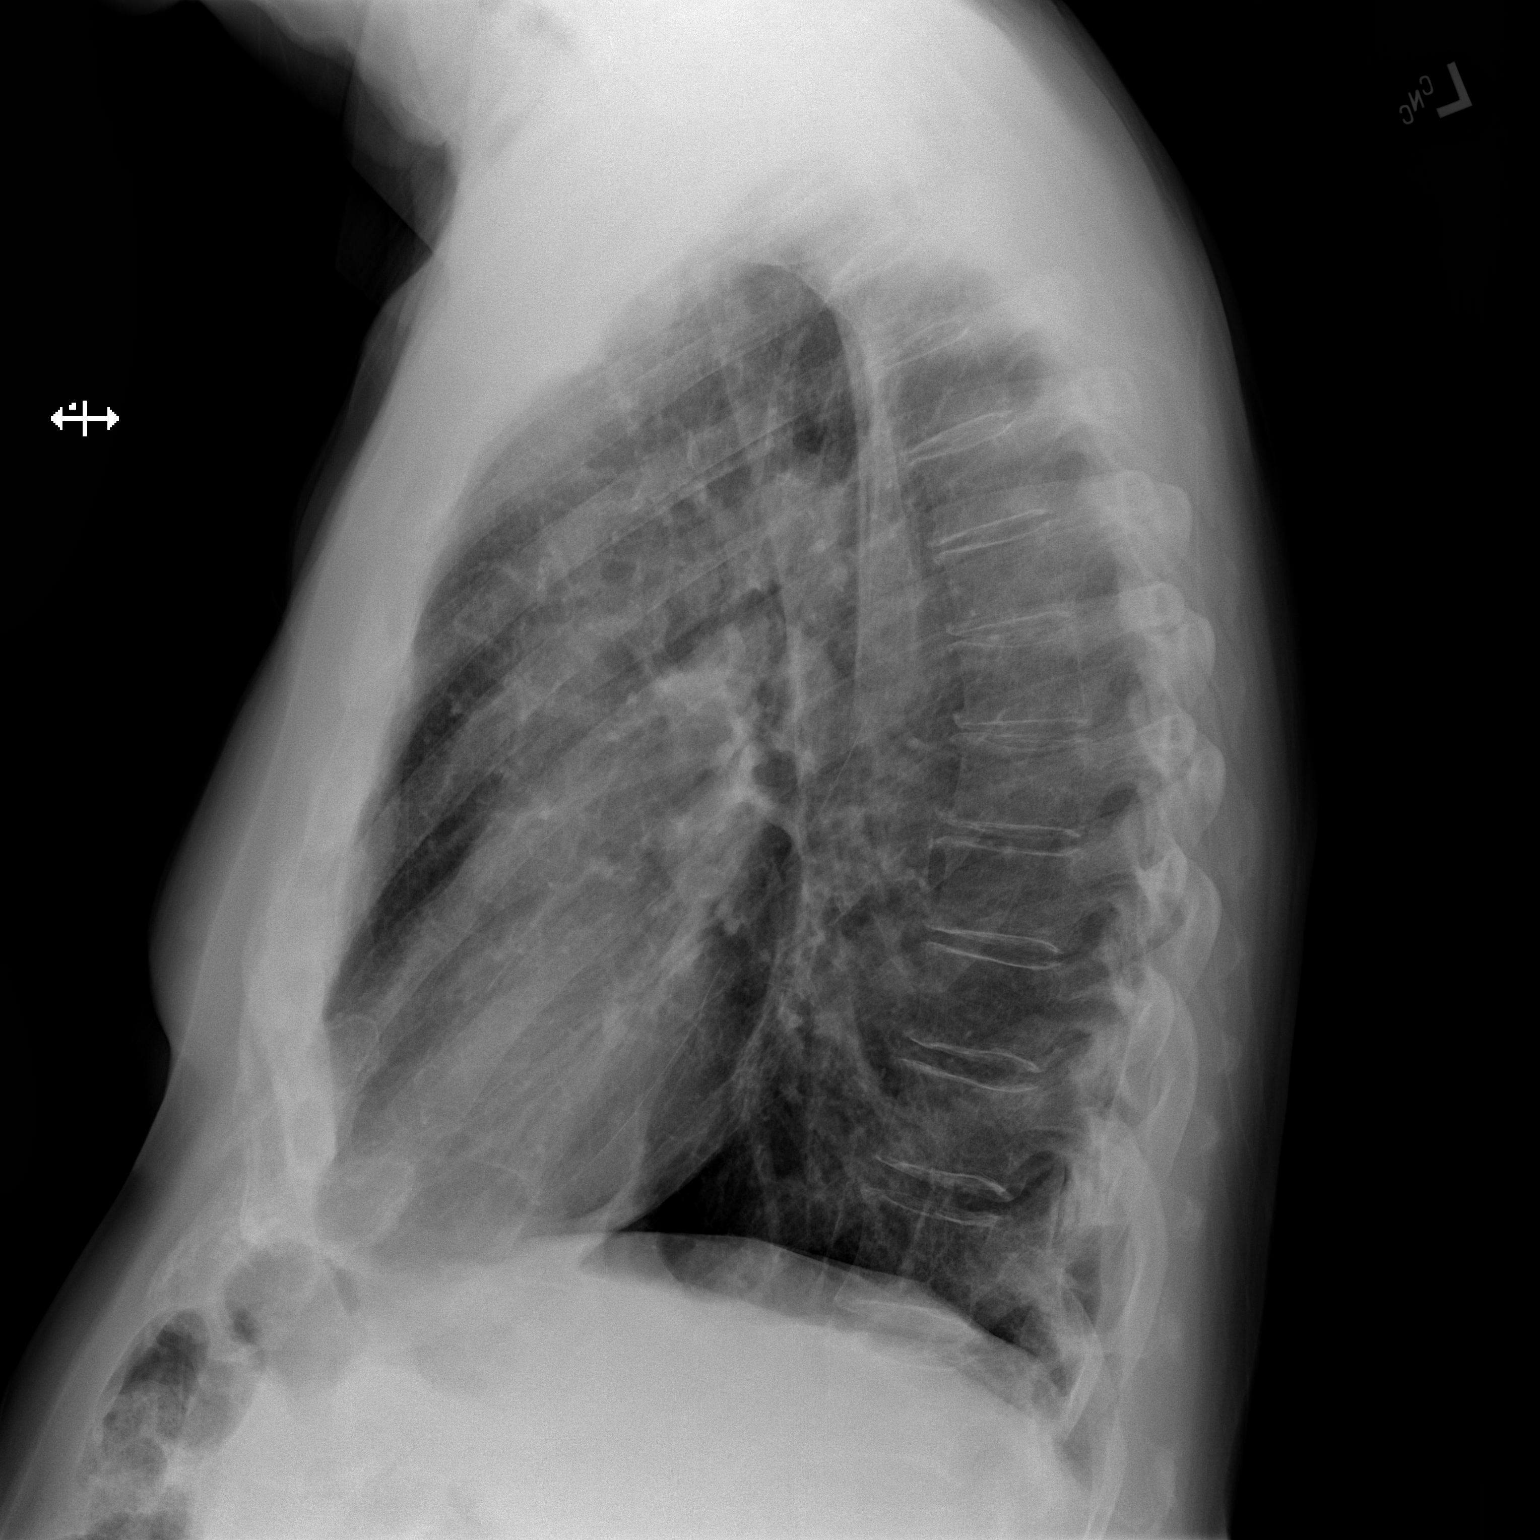

[2 of 2 positions shown; findings below may reference images not displayed]

FINDINGS: There is hyperinflation of the lungs compatible with COPD.
Interstitial prominence predominantly in the right lung base is
stable. No confluent opacities or effusions. No acute bony
abnormality.
IMPRESSION: COPD. Interstitial prominence in the right base is stable. No acute
findings.

## 2018-01-12 DIAGNOSIS — J441 Chronic obstructive pulmonary disease with (acute) exacerbation: Secondary | ICD-10-CM | POA: Diagnosis not present

## 2018-01-15 ENCOUNTER — Telehealth: Payer: Self-pay | Admitting: Pulmonary Disease

## 2018-01-15 ENCOUNTER — Encounter: Payer: Self-pay | Admitting: Pulmonary Disease

## 2018-01-15 NOTE — Telephone Encounter (Signed)
Noted  

## 2018-01-15 NOTE — Telephone Encounter (Signed)
Patient signed [name] to [facility] to Dr. Alva Garnet.  Faxed Request to [facility]

## 2018-01-26 ENCOUNTER — Telehealth: Payer: Self-pay | Admitting: *Deleted

## 2018-01-26 ENCOUNTER — Ambulatory Visit: Payer: PPO | Admitting: Pulmonary Disease

## 2018-01-26 DIAGNOSIS — J449 Chronic obstructive pulmonary disease, unspecified: Secondary | ICD-10-CM

## 2018-01-26 NOTE — Telephone Encounter (Signed)
Will wait for CD to arrive and give to DS.

## 2018-01-26 NOTE — Telephone Encounter (Signed)
[facility] .  LMOV to confirm need for imaging cd.

## 2018-01-26 NOTE — Telephone Encounter (Signed)
Pt rescheduled appt due to he requested his CT scan from the [place] to be sent to DS. We have not received CD so pt r/s appt. States once we recive it to please let him know so he can r/s his appt to go over it. He also states that [place] wants him to have a PET. He is wanting DS' opinion on the CT.

## 2018-02-03 ENCOUNTER — Telehealth: Payer: Self-pay | Admitting: Pulmonary Disease

## 2018-02-03 NOTE — Telephone Encounter (Signed)
Patient calling to find out if we have received his notes from the [facility], if not he will see if he can go down there and get them Please call to discuss

## 2018-02-04 NOTE — Telephone Encounter (Signed)
Spoke with pt and he states he goes 02/24/18 to have a PET scan at [facility]. Informed pt before he leaves there that day to get the PET and CT scan on a CD and he can bring them in to DS. Pt verbalzied understanding. Nothing further needed.

## 2018-02-11 ENCOUNTER — Encounter: Payer: Self-pay | Admitting: Pulmonary Disease

## 2018-02-11 NOTE — Progress Notes (Signed)
Pt cancelled appt

## 2018-02-12 DIAGNOSIS — J441 Chronic obstructive pulmonary disease with (acute) exacerbation: Secondary | ICD-10-CM | POA: Diagnosis not present

## 2018-02-25 ENCOUNTER — Telehealth: Payer: Self-pay | Admitting: Pulmonary Disease

## 2018-02-25 NOTE — Telephone Encounter (Signed)
[facility] and scheduled appt with [facility] for Monday, placed cd in nurse box.

## 2018-02-25 NOTE — Telephone Encounter (Signed)
noted 

## 2018-03-01 ENCOUNTER — Ambulatory Visit: Payer: PPO | Admitting: Pulmonary Disease

## 2018-03-03 ENCOUNTER — Encounter: Payer: Self-pay | Admitting: Pulmonary Disease

## 2018-03-03 ENCOUNTER — Ambulatory Visit (INDEPENDENT_AMBULATORY_CARE_PROVIDER_SITE_OTHER): Payer: PPO | Admitting: Pulmonary Disease

## 2018-03-03 VITALS — BP 120/66 | HR 52 | Ht 71.0 in | Wt 196.0 lb

## 2018-03-03 DIAGNOSIS — R918 Other nonspecific abnormal finding of lung field: Secondary | ICD-10-CM | POA: Diagnosis not present

## 2018-03-03 DIAGNOSIS — J449 Chronic obstructive pulmonary disease, unspecified: Secondary | ICD-10-CM | POA: Diagnosis not present

## 2018-03-03 DIAGNOSIS — J9611 Chronic respiratory failure with hypoxia: Secondary | ICD-10-CM

## 2018-03-03 DIAGNOSIS — Z77090 Contact with and (suspected) exposure to asbestos: Secondary | ICD-10-CM

## 2018-03-03 NOTE — Patient Instructions (Signed)
Continue Symbicort and Spiriva as maintenance medications Continue albuterol inhaler as needed as first-line rescue medication Continue DuoNeb as needed as second line rescue medication Continue oxygen therapy with sleep and as needed during the day Follow-up in 6 months or sooner as needed

## 2018-03-08 DIAGNOSIS — E782 Mixed hyperlipidemia: Secondary | ICD-10-CM | POA: Diagnosis not present

## 2018-03-08 DIAGNOSIS — I10 Essential (primary) hypertension: Secondary | ICD-10-CM | POA: Diagnosis not present

## 2018-03-08 DIAGNOSIS — R7303 Prediabetes: Secondary | ICD-10-CM | POA: Diagnosis not present

## 2018-03-08 NOTE — Progress Notes (Signed)
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 No increased metabolic activity   INTERVAL HISTORY: No major events since last visit  SUBJ: Routine re-eval. Overall, no major change in resp/pulm status. Wearing O2 with sleep and "as needed" during daytime. Maintained on Spiriva, Symbicort. Rarely uses rescue MDI and nebs. Denies CP, fever, purulent sputum, hemoptysis, LE edema and calf tenderness.   OBJ: Vitals:   03/03/18 1101 03/03/18 1106  BP:  120/66  Pulse:  (!) 52  SpO2:  95%  Weight: 196 lb (88.9 kg)   Height: 5\' 11"  (1.803 m)   RA  Gen: NAD HEENT: NCAT, sclera white Neck: No JVD Lungs: breath diminished, no wheezes or other adventitious sounds Cardiovascular: RRR, no murmurs Abdomen: Soft, nontender, normal BS Ext: without clubbing, cyanosis. Trace symmetric ankle edema Neuro: grossly intact Skin: Limited exam, no lesions noted    DATA: CXR: NNF    IMPRESSION: 1) Severe COPD - well compensated 2) History of asbestos exposure without definitive evidence of asbestosis or pleural disease 3) nocturnal and exertional hypoxemia    PLAN: Continue Symbicort and Spiriva as maintenance medications Continue albuterol inhaler as needed as first-line rescue medication Continue DuoNeb as needed as second line rescue medication Continue oxygen therapy with sleep and as needed during the day Follow-up in 6 months or sooner as needed  Merton Border, MD PCCM service Mobile [phone] Pager  [phone] 03/08/2018 5:10 PM

## 2018-03-14 DIAGNOSIS — J441 Chronic obstructive pulmonary disease with (acute) exacerbation: Secondary | ICD-10-CM | POA: Diagnosis not present

## 2018-03-16 DIAGNOSIS — E782 Mixed hyperlipidemia: Secondary | ICD-10-CM | POA: Diagnosis not present

## 2018-03-16 DIAGNOSIS — R7303 Prediabetes: Secondary | ICD-10-CM | POA: Diagnosis not present

## 2018-03-16 DIAGNOSIS — I10 Essential (primary) hypertension: Secondary | ICD-10-CM | POA: Diagnosis not present

## 2018-03-26 ENCOUNTER — Ambulatory Visit
Admission: RE | Admit: 2018-03-26 | Discharge: 2018-03-26 | Disposition: A | Discharge status: Home / Self Care | Payer: Self-pay | Source: Ambulatory Visit | Attending: Internal Medicine | Admitting: Internal Medicine

## 2018-03-26 ENCOUNTER — Other Ambulatory Visit: Payer: Self-pay | Admitting: Internal Medicine

## 2018-03-26 DIAGNOSIS — R918 Other nonspecific abnormal finding of lung field: Secondary | ICD-10-CM

## 2018-04-14 DIAGNOSIS — J441 Chronic obstructive pulmonary disease with (acute) exacerbation: Secondary | ICD-10-CM | POA: Diagnosis not present

## 2018-05-14 DIAGNOSIS — J441 Chronic obstructive pulmonary disease with (acute) exacerbation: Secondary | ICD-10-CM | POA: Diagnosis not present

## 2018-05-17 ENCOUNTER — Other Ambulatory Visit: Payer: Self-pay

## 2018-05-17 NOTE — Patient Outreach (Signed)
[facility]) Care Management  05/17/2018  Devin Martinez December 26, 1940 [id number]   Telephone Screen  Referral Date: 05/17/18 Referral Source: Nurse Call Center Referral Reason: "caller states he is wanting to know how to proceed with a doctor appointment. The pat 3 months he has been having dizzy spells an blurry vision. He has not taken his BP medsication in the pat two days. Most recent BP reading was 145/51." Insurance: HTA   Outreach attempt # 1 to patient. Spoke with patient. He voices that he is feeling "pretty good." He does report that Saturday night he ended up with food poisoning. He stets that he did not go and get checked out and have diagnosis confirmed but instead he "toughed it out." He states that symptoms have resolved. Patient reports that his BP has been WNL for the past several days since he stopped taking his BP meds. Last BP reading was 100/55. He has not had any recent dizziness or blurred vision. Patient states that his BP meds were decreased a few months ago and feels that it needs to be adjusted again. He has appt tomorrow at the [place] and will discuss this with MD. He denies any further RN CM needs or concerns at this time. Patient advised to call 24hr Nurse Line for any further questions or concerns. He voiced understanding and was appreciative of follow up call.      Plan: RN CM will close case as no further interventions needed at this time.    Enzo Montgomery, RN,BSN,CCM [place] Management Telephonic Care Management Coordinator Direct Phone: [phone] Toll Free: [phone] Fax: [phone]

## 2018-05-19 DIAGNOSIS — I10 Essential (primary) hypertension: Secondary | ICD-10-CM | POA: Diagnosis not present

## 2018-05-23 ENCOUNTER — Emergency Department: Payer: No Typology Code available for payment source

## 2018-05-23 ENCOUNTER — Other Ambulatory Visit: Payer: Self-pay

## 2018-05-23 ENCOUNTER — Observation Stay
Admission: EM | Admit: 2018-05-23 | Discharge: 2018-05-24 | Disposition: A | Discharge status: Home / Self Care | Payer: No Typology Code available for payment source | Attending: Internal Medicine | Admitting: Internal Medicine

## 2018-05-23 ENCOUNTER — Encounter: Payer: Self-pay | Admitting: Emergency Medicine

## 2018-05-23 DIAGNOSIS — R0602 Shortness of breath: Secondary | ICD-10-CM | POA: Diagnosis not present

## 2018-05-23 DIAGNOSIS — Z7951 Long term (current) use of inhaled steroids: Secondary | ICD-10-CM | POA: Insufficient documentation

## 2018-05-23 DIAGNOSIS — J9621 Acute and chronic respiratory failure with hypoxia: Secondary | ICD-10-CM | POA: Insufficient documentation

## 2018-05-23 DIAGNOSIS — J440 Chronic obstructive pulmonary disease with acute lower respiratory infection: Secondary | ICD-10-CM | POA: Insufficient documentation

## 2018-05-23 DIAGNOSIS — Z79899 Other long term (current) drug therapy: Secondary | ICD-10-CM | POA: Diagnosis not present

## 2018-05-23 DIAGNOSIS — J181 Lobar pneumonia, unspecified organism: Secondary | ICD-10-CM | POA: Insufficient documentation

## 2018-05-23 DIAGNOSIS — Z9981 Dependence on supplemental oxygen: Secondary | ICD-10-CM | POA: Insufficient documentation

## 2018-05-23 DIAGNOSIS — E669 Obesity, unspecified: Secondary | ICD-10-CM | POA: Insufficient documentation

## 2018-05-23 DIAGNOSIS — J441 Chronic obstructive pulmonary disease with (acute) exacerbation: Secondary | ICD-10-CM | POA: Diagnosis not present

## 2018-05-23 DIAGNOSIS — J189 Pneumonia, unspecified organism: Secondary | ICD-10-CM | POA: Diagnosis not present

## 2018-05-23 DIAGNOSIS — J9610 Chronic respiratory failure, unspecified whether with hypoxia or hypercapnia: Secondary | ICD-10-CM | POA: Diagnosis not present

## 2018-05-23 DIAGNOSIS — Z87891 Personal history of nicotine dependence: Secondary | ICD-10-CM | POA: Diagnosis not present

## 2018-05-23 DIAGNOSIS — R0603 Acute respiratory distress: Secondary | ICD-10-CM

## 2018-05-23 DIAGNOSIS — E119 Type 2 diabetes mellitus without complications: Secondary | ICD-10-CM | POA: Insufficient documentation

## 2018-05-23 DIAGNOSIS — Z6827 Body mass index (BMI) 27.0-27.9, adult: Secondary | ICD-10-CM | POA: Diagnosis not present

## 2018-05-23 DIAGNOSIS — I10 Essential (primary) hypertension: Secondary | ICD-10-CM | POA: Diagnosis not present

## 2018-05-23 LAB — CBC WITH DIFFERENTIAL/PLATELET
Basophils Absolute: 0 10*3/uL (ref 0–0.1)
Basophils Relative: 0 %
Eosinophils Absolute: 0 10*3/uL (ref 0–0.7)
Eosinophils Relative: 0 %
HCT: 40.7 % (ref 40.0–52.0)
Hemoglobin: 13.7 g/dL (ref 13.0–18.0)
Lymphocytes Relative: 6 %
Lymphs Abs: 0.8 10*3/uL — ABNORMAL LOW (ref 1.0–3.6)
MCH: 29 pg (ref 26.0–34.0)
MCHC: 33.6 g/dL (ref 32.0–36.0)
MCV: 86.3 fL (ref 80.0–100.0)
Monocytes Absolute: 0.8 10*3/uL (ref 0.2–1.0)
Monocytes Relative: 6 %
Neutro Abs: 11.9 10*3/uL — ABNORMAL HIGH (ref 1.4–6.5)
Neutrophils Relative %: 88 %
Platelets: 268 10*3/uL (ref 150–440)
RBC: 4.71 MIL/uL (ref 4.40–5.90)
RDW: 13.8 % (ref 11.5–14.5)
WBC: 13.5 10*3/uL — ABNORMAL HIGH (ref 3.8–10.6)

## 2018-05-23 LAB — LACTIC ACID, PLASMA: Lactic Acid, Venous: 1.2 mmol/L (ref 0.5–1.9)

## 2018-05-23 LAB — BASIC METABOLIC PANEL
Anion gap: 11 (ref 5–15)
BUN: 20 mg/dL (ref 8–23)
CO2: 29 mmol/L (ref 22–32)
Calcium: 8.7 mg/dL — ABNORMAL LOW (ref 8.9–10.3)
Chloride: 98 mmol/L (ref 98–111)
Creatinine, Ser: 0.81 mg/dL (ref 0.61–1.24)
GFR calc Af Amer: >60 mL/min (ref >60)
GFR calc non Af Amer: >60 mL/min (ref >60)
Glucose, Bld: 114 mg/dL — ABNORMAL HIGH (ref 70–99)
Potassium: 3.7 mmol/L (ref 3.5–5.1)
Sodium: 138 mmol/L (ref 135–145)

## 2018-05-23 LAB — EXPECTORATED SPUTUM ASSESSMENT W GRAM STAIN, RFLX TO RESP C

## 2018-05-23 LAB — EXPECTORATED SPUTUM ASSESSMENT W REFEX TO RESP CULTURE

## 2018-05-23 LAB — GLUCOSE, CAPILLARY
Glucose-Capillary: 138 mg/dL — ABNORMAL HIGH (ref 70–99)
Glucose-Capillary: 165 mg/dL — ABNORMAL HIGH (ref 70–99)

## 2018-05-23 LAB — TROPONIN I: Troponin I: <0.03 ng/mL (ref <0.03)

## 2018-05-23 MED ORDER — ENOXAPARIN SODIUM 40 MG/0.4ML ~~LOC~~ SOLN
40.0000 mg | SUBCUTANEOUS | Status: DC
  Administered 2018-05-23: 21:00:00 40 mg via SUBCUTANEOUS
  Filled 2018-05-23: qty 0.4

## 2018-05-23 MED ORDER — ONDANSETRON HCL 4 MG/2ML IJ SOLN: 4.0000 mg | Freq: Four times a day (QID) | INTRAMUSCULAR | Status: DC | PRN

## 2018-05-23 MED ORDER — ALBUTEROL SULFATE (2.5 MG/3ML) 0.083% IN NEBU
5.0000 mg | INHALATION_SOLUTION | Freq: Once | RESPIRATORY_TRACT | Status: AC
  Administered 2018-05-23: 5 mg via RESPIRATORY_TRACT
  Filled 2018-05-23: qty 6

## 2018-05-23 MED ORDER — IPRATROPIUM-ALBUTEROL 0.5-2.5 (3) MG/3ML IN SOLN
3.0000 mL | Freq: Four times a day (QID) | RESPIRATORY_TRACT | Status: DC
  Administered 2018-05-23 – 2018-05-24 (×4): 3 mL via RESPIRATORY_TRACT
  Filled 2018-05-23 (×4): qty 3

## 2018-05-23 MED ORDER — ACETAMINOPHEN 650 MG RE SUPP: 650.0000 mg | Freq: Four times a day (QID) | RECTAL | Status: DC | PRN

## 2018-05-23 MED ORDER — LORATADINE 10 MG PO TABS
10.0000 mg | ORAL_TABLET | Freq: Every day | ORAL | Status: DC
  Administered 2018-05-23 – 2018-05-24 (×2): 10 mg via ORAL
  Filled 2018-05-23 (×2): qty 1

## 2018-05-23 MED ORDER — DOXYCYCLINE HYCLATE 100 MG PO TABS
100.0000 mg | ORAL_TABLET | Freq: Once | ORAL | Status: AC
  Administered 2018-05-23: 100 mg via ORAL
  Filled 2018-05-23: qty 1

## 2018-05-23 MED ORDER — ALBUTEROL SULFATE (2.5 MG/3ML) 0.083% IN NEBU
10.0000 mg | INHALATION_SOLUTION | Freq: Once | RESPIRATORY_TRACT | Status: AC
  Administered 2018-05-23: 10 mg via RESPIRATORY_TRACT
  Filled 2018-05-23: qty 12

## 2018-05-23 MED ORDER — IPRATROPIUM-ALBUTEROL 0.5-2.5 (3) MG/3ML IN SOLN
3.0000 mL | Freq: Once | RESPIRATORY_TRACT | Status: AC
  Administered 2018-05-23: 3 mL via RESPIRATORY_TRACT
  Filled 2018-05-23: qty 3

## 2018-05-23 MED ORDER — ALBUTEROL SULFATE (2.5 MG/3ML) 0.083% IN NEBU: 2.5000 mg | INHALATION_SOLUTION | RESPIRATORY_TRACT | Status: DC | PRN

## 2018-05-23 MED ORDER — SODIUM CHLORIDE 0.9 % IV SOLN: 250.0000 mL | INTRAVENOUS | Status: DC | PRN

## 2018-05-23 MED ORDER — HYDROCHLOROTHIAZIDE 12.5 MG PO CAPS
12.5000 mg | ORAL_CAPSULE | Freq: Every day | ORAL | Status: DC
  Administered 2018-05-24: 09:00:00 12.5 mg via ORAL
  Filled 2018-05-23: qty 1

## 2018-05-23 MED ORDER — GUAIFENESIN-DM 100-10 MG/5ML PO SYRP
5.0000 mL | ORAL_SOLUTION | ORAL | Status: DC | PRN
  Filled 2018-05-23: qty 5

## 2018-05-23 MED ORDER — SENNOSIDES-DOCUSATE SODIUM 8.6-50 MG PO TABS: 1.0000 | ORAL_TABLET | Freq: Every evening | ORAL | Status: DC | PRN

## 2018-05-23 MED ORDER — SODIUM CHLORIDE 0.9 % IV SOLN
1.0000 g | Freq: Once | INTRAVENOUS | Status: AC
  Administered 2018-05-23: 1 g via INTRAVENOUS
  Filled 2018-05-23: qty 10

## 2018-05-23 MED ORDER — HYDROCODONE-ACETAMINOPHEN 5-325 MG PO TABS: 1.0000 | ORAL_TABLET | ORAL | Status: DC | PRN

## 2018-05-23 MED ORDER — SODIUM CHLORIDE 0.9% FLUSH
3.0000 mL | Freq: Two times a day (BID) | INTRAVENOUS | Status: DC
  Administered 2018-05-23 – 2018-05-24 (×3): 3 mL via INTRAVENOUS

## 2018-05-23 MED ORDER — INSULIN ASPART 100 UNIT/ML ~~LOC~~ SOLN: 0.0000 [IU] | Freq: Every day | SUBCUTANEOUS | Status: DC

## 2018-05-23 MED ORDER — BISACODYL 5 MG PO TBEC
5.0000 mg | DELAYED_RELEASE_TABLET | Freq: Every day | ORAL | Status: DC | PRN
  Administered 2018-05-23: 5 mg via ORAL
  Filled 2018-05-23: qty 1

## 2018-05-23 MED ORDER — SODIUM CHLORIDE 0.9% FLUSH
3.0000 mL | INTRAVENOUS | Status: DC | PRN
  Administered 2018-05-24: 3 mL via INTRAVENOUS
  Filled 2018-05-23: qty 3

## 2018-05-23 MED ORDER — ACETAMINOPHEN 325 MG PO TABS: 650.0000 mg | ORAL_TABLET | Freq: Four times a day (QID) | ORAL | Status: DC | PRN

## 2018-05-23 MED ORDER — SODIUM CHLORIDE 0.9 % IV SOLN
500.0000 mg | INTRAVENOUS | Status: DC
  Filled 2018-05-23: qty 500

## 2018-05-23 MED ORDER — INSULIN ASPART 100 UNIT/ML ~~LOC~~ SOLN
0.0000 [IU] | Freq: Three times a day (TID) | SUBCUTANEOUS | Status: DC
  Administered 2018-05-23 – 2018-05-24 (×2): 1 [IU] via SUBCUTANEOUS
  Administered 2018-05-24: 09:00:00 2 [IU] via SUBCUTANEOUS
  Filled 2018-05-23 (×3): qty 1

## 2018-05-23 MED ORDER — ONDANSETRON HCL 4 MG PO TABS: 4.0000 mg | ORAL_TABLET | Freq: Four times a day (QID) | ORAL | Status: DC | PRN

## 2018-05-23 MED ORDER — SODIUM CHLORIDE 0.9 % IV SOLN
1.0000 g | INTRAVENOUS | Status: DC
  Filled 2018-05-23: qty 10

## 2018-05-23 MED ORDER — METHYLPREDNISOLONE SODIUM SUCC 125 MG IJ SOLR
60.0000 mg | Freq: Two times a day (BID) | INTRAMUSCULAR | Status: DC
  Administered 2018-05-23 – 2018-05-24 (×3): 60 mg via INTRAVENOUS
  Filled 2018-05-23 (×3): qty 2

## 2018-05-23 MED ORDER — MOMETASONE FURO-FORMOTEROL FUM 200-5 MCG/ACT IN AERO
2.0000 | INHALATION_SPRAY | Freq: Two times a day (BID) | RESPIRATORY_TRACT | Status: DC
  Administered 2018-05-23 – 2018-05-24 (×2): 2 via RESPIRATORY_TRACT
  Filled 2018-05-23: qty 8.8

## 2018-05-23 NOTE — ED Notes (Signed)
Report called at this time [facility] transport pt to floor.

## 2018-05-23 NOTE — Progress Notes (Signed)
Advanced Care Plan.  Purpose of Encounter: CODE STATUS. Parties in Attendance: The patient and me. Patient's Decisional Capacity: Yes. Medical Story: Devin Martinez  is a 77 y.o. male with a known history of chronic respiratory failure on home oxygen 1.5 L, COPD, hypertension, diabetes, hyperlipidemia, obesity.  He is being admitted for COPD exacerbation and possible pneumonia.  I discussed with the patient about his current condition, prognosis and CODE STATUS.  The patient wants to be resuscitated and intubation if necessary.  Plan:  Code Status: Full code for now. Time spent discussing advance care planning: 17 minutes.

## 2018-05-23 NOTE — ED Notes (Signed)
Dr. Chen at bedside at this time

## 2018-05-23 NOTE — ED Notes (Signed)
Pt is stable, A/Ox4 [place] 2L and transported by [employer].

## 2018-05-23 NOTE — ED Notes (Signed)
2 different RN drew labs on this pt, 1 redraw already completed. RN just received notification from lab tech Morene Crocker that the last draw of light green was hemolysis. Lac that was sent after is not . Rn informed them to come draw. RN will monitor for results.

## 2018-05-23 NOTE — ED Triage Notes (Addendum)
Patient reports nausea/vomiting/diarrhea last weekend.  Patient states those symptoms resolved but states now he has a cough and shortness of breath and is coughing up a lot of mucous.  Patient states mucous is mostly clear.  Patient has audible wheezing during triage.  Patient has history of COPD.

## 2018-05-23 NOTE — ED Notes (Signed)
Pt SpO2 at 88 % on room air with good wave form on monitor. This RN placed pt on 2 liter O2 via [place] with improvement in SpO2 to 94 %. Primary RN and MD notified.

## 2018-05-23 NOTE — ED Provider Notes (Signed)
[facility] Emergency Department Provider Note  ____________________________________________  Time seen: Approximately 11:52 AM  I have reviewed the triage vital signs and the nursing notes.   HISTORY  Chief Complaint Shortness of Breath   HPI Tad ADONUS USELMAN is a 77 y.o. male with history of COPD on 1.5L [place], asthma, diabetes, hypertension, hyperlipidemia who presents for evaluation of shortness of breath.  Patient reports that a week ago he had a case of "food poisoning".  He reports 2 days of several daily episodes of vomiting and diarrhea.  5 days ago his symptoms resolved and he started having a cough that is productive of yellow/green phlegm and has had progressively worsening shortness of breath.  He has been using his inhalers at home.  The shortness of breath became more severe today.  He denies chest pain, fever, chills, nausea, vomiting.  He denies personal or family history of blood clots, recent travel immobilization, leg pain or swelling, or hemoptysis.  Past Medical History:  Diagnosis Date  . Asthma   . COPD (chronic obstructive pulmonary disease) ([place])   . Degenerative arthritis of left knee   . Diabetes mellitus    Type II  . Herpes   . Hyperlipidemia   . Hypertension   . LVH (left ventricular hypertrophy)    Hx. of  . Obesity   . Smoker     Patient Active Problem List   Diagnosis Date Noted  . Acute respiratory distress 03/16/2016  . Hyperlipidemia 02/19/2011  . HTN (hypertension) 02/19/2011  . Diabetes mellitus 02/19/2011  . Fatigue 02/19/2011    Past Surgical History:  Procedure Laterality Date  . HERNIA REPAIR    . JOINT REPLACEMENT    . LAMINECTOMY      Prior to Admission medications   Medication Sig Start Date End Date Taking? Authorizing Provider  budesonide-formoterol (SYMBICORT) 160-4.5 MCG/ACT inhaler Inhale 2 puffs into the lungs 2 (two) times daily.    [provider]  enalapril-hydrochlorothiazide  (VASERETIC) 10-25 MG per tablet Take 1 tablet by mouth every other day.     [provider]  ipratropium-albuterol (DUONEB) 0.5-2.5 (3) MG/3ML SOLN Take 3 mLs by nebulization every 6 (six) hours. 03/23/16   Epifanio Lesches, MD  Red Yeast Rice Extract (RED YEAST RICE PO) Take 1 tablet by mouth daily.      [provider]    Allergies Enalapril  Family History  Problem Relation Age of Onset  . Heart attack Mother     Social History Social History   Tobacco Use  . Smoking status: Former Smoker    Types: Cigarettes  . Smokeless tobacco: Never Used  Substance Use Topics  . Alcohol use: Yes    Comment: occasionally  . Drug use: No    Review of Systems  Constitutional: Negative for fever. Eyes: Negative for visual changes. ENT: Negative for sore throat. Neck: No neck pain  Cardiovascular: Negative for chest pain. Respiratory: + shortness of breath, cough Gastrointestinal: Negative for abdominal pain, vomiting or diarrhea. Genitourinary: Negative for dysuria. Musculoskeletal: Negative for back pain. Skin: Negative for rash. Neurological: Negative for headaches, weakness or numbness. Psych: No SI or HI  ____________________________________________   PHYSICAL EXAM:  VITAL SIGNS: ED Triage Vitals  Enc Vitals Group     BP 05/23/18 0930 (!) 150/76     Pulse Rate 05/23/18 0930 (!) 106     Resp 05/23/18 0930 (!) 26     Temp 05/23/18 0930 98 F (36.7 C)  Temp Source 05/23/18 0930 Oral     SpO2 05/23/18 0930 90 %     Weight 05/23/18 0931 196 lb (88.9 kg)     Height 05/23/18 0931 5\' 11"  (1.803 m)     Head Circumference --      Peak Flow --      Pain Score 05/23/18 0930 0     Pain Loc --      Pain Edu? --      Excl. in [place]? --     Constitutional: Alert and oriented. Well appearing and in no apparent distress. HEENT:      Head: Normocephalic and atraumatic.         Eyes: Conjunctivae are normal. Sclera is non-icteric.       Mouth/Throat:  Mucous membranes are moist.       Neck: Supple with no signs of meningismus. Cardiovascular: Tachycardic with regular rhythm. No murmurs, gallops, or rubs. 2+ symmetrical distal pulses are present in all extremities. No JVD. Respiratory: Tachypneic, sating 93% on 1.5L [place], diffuse expiratory wheezes and diminished air movement bilaterally. Gastrointestinal: Soft, non tender, and non distended with positive bowel sounds. No rebound or guarding. Musculoskeletal: Nontender with normal range of motion in all extremities. No edema, cyanosis, or erythema of extremities. Neurologic: Normal speech and language. Face is symmetric. Moving all extremities. No gross focal neurologic deficits are appreciated. Skin: Skin is warm, dry and intact. No rash noted. Psychiatric: Mood and affect are normal. Speech and behavior are normal.  ____________________________________________   LABS (all labs ordered are listed, but only abnormal results are displayed)  Labs Reviewed  CBC WITH DIFFERENTIAL/PLATELET - Abnormal; Notable for the following components:      Result Value   WBC 13.5 (*)    Neutro Abs 11.9 (*)    Lymphs Abs 0.8 (*)    All other components within normal limits  BASIC METABOLIC PANEL - Abnormal; Notable for the following components:   Glucose, Bld 114 (*)    Calcium 8.7 (*)    All other components within normal limits  TROPONIN I  LACTIC ACID, PLASMA   ____________________________________________  EKG  ED ECG REPORT I, Rudene Re, the attending physician, personally viewed and interpreted this ECG.  Sinus tachycardia, rate of 107, frequent PACs, normal intervals, normal axis, no ST elevations or depressions. ____________________________________________  RADIOLOGY  I have personally reviewed the images performed during this visit and I agree with the Radiologist's read.   Interpretation by Radiologist:  Dg Chest 2 View  Result Date: 05/23/2018 CLINICAL DATA:  Acute  shortness of breath. EXAM: CHEST - 2 VIEW COMPARISON:  05/19/2017 and prior radiographs. 01/15/2018 outside CT FINDINGS: The cardiomediastinal silhouette is unremarkable. LEFT LOWER lobe posterior basilar opacity may represent airspace disease or atelectasis. COPD changes again noted with slightly increasing peribronchial thickening. No pulmonary mass, suspicious nodule, pleural effusion or pneumothorax identified. No acute bony abnormalities are noted. IMPRESSION: LEFT LOWER lobe posterior basilar opacity - question airspace disease/pneumonia versus atelectasis. Radiographic follow-up to resolution recommended. COPD changes. Electronically Signed   By: Margarette Canada M.D.   On: 05/23/2018 10:20   ____________________________________________   PROCEDURES  Procedure(s) performed: None Procedures Critical Care performed:  Yes  CRITICAL CARE Performed by: Rudene Re  ?  Total critical care time: 35 min  Critical care time was exclusive of separately billable procedures and treating other patients.  Critical care was necessary to treat or prevent imminent or life-threatening deterioration.  Critical care was time spent personally by  me on the following activities: development of treatment plan with patient and/or surrogate as well as nursing, discussions with consultants, evaluation of patient's response to treatment, examination of patient, obtaining history from patient or surrogate, ordering and performing treatments and interventions, ordering and review of laboratory studies, ordering and review of radiographic studies, pulse oximetry and re-evaluation of patient's condition.  ____________________________________________   INITIAL IMPRESSION / ASSESSMENT AND PLAN / ED COURSE   77 y.o. male with history of COPD on 1.5L [place], asthma, diabetes, hypertension, hyperlipidemia who presents for evaluation of shortness of breath.  Patient with cough productive of green sputum, shortness of  breath, no increased oxygen requirement than baseline but increased work of breathing with wheezing and tachypneic.  Patient is tachycardic however that was after receiving albuterol in the waiting room.  His EKG shows sinus tachycardia with no evidence of ischemia or dysrhythmias.  Chest x-ray concerning for LLL PNA. WBC is elevated at 13.5. Lactic and other labs pending. Patient given rocephin and doxycycline for CA-PNA. Duoneb and solumedrol for COPD exacerbation.     _________________________ 1:16 PM on 05/23/2018 -----------------------------------------  Lactic's normal.  Patient remains afebrile.  He continues to be extremely tachypneic with increased work of breathing after 3 DuoNeb's.  Will start patient on an hour of continuous albuterol and admit to the hospitalist service for pneumonia and COPD exacerbation.  Discussed with Dr. Bridgett Larsson.   As part of my medical decision making, I reviewed the following data within the [place] notes reviewed and incorporated, Labs reviewed , EKG interpreted , Old EKG reviewed, Old chart reviewed, Radiograph reviewed , Discussed with admitting physician , Notes from prior ED visits and  Controlled Substance Database    Pertinent labs & imaging results that were available during my care of the patient were reviewed by me and considered in my medical decision making (see chart for details).    ____________________________________________   FINAL CLINICAL IMPRESSION(S) / ED DIAGNOSES  Final diagnoses:  Community acquired pneumonia of left lower lobe of lung ([place])  Respiratory distress  COPD exacerbation ([place])      NEW MEDICATIONS STARTED DURING THIS VISIT:  ED Discharge Orders    None       Note:  This document was prepared using Dragon voice recognition software and may include unintentional dictation errors.    Alfred Levins, [place], MD 05/23/18 1316

## 2018-05-23 NOTE — H&P (Signed)
[facility] [place] NAME: Devin Martinez    MR#:  [id number]  DATE OF BIRTH:  Jun 28, 1941  DATE OF ADMISSION:  05/23/2018  PRIMARY CARE PHYSICIAN: Dion Body, MD   REQUESTING/REFERRING PHYSICIAN: Rudene Re, MD  CHIEF COMPLAINT:   Chief Complaint  Patient presents with  . Shortness of Breath   Cough and shortness of breath for 2 days. HISTORY OF PRESENT ILLNESS:  Devin Martinez  is a 77 y.o. male with a known history of chronic respiratory failure on home oxygen 1.5 L, COPD, hypertension, diabetes, hyperlipidemia, obesity.  The patient presents the ED with above chief complaints.  He had food poisoning with nausea and vomiting 5 days ago, which resolved.  But the patient developed a cough, shortness of breath and wheezing for the past 2 days.  Chest x-ray show left-sided pneumonia. PAST MEDICAL HISTORY:   Past Medical History:  Diagnosis Date  . Asthma   . COPD (chronic obstructive pulmonary disease) ([place])   . Degenerative arthritis of left knee   . Diabetes mellitus    Type II  . Herpes   . Hyperlipidemia   . Hypertension   . LVH (left ventricular hypertrophy)    Hx. of  . Obesity   . Smoker     PAST SURGICAL HISTORY:   Past Surgical History:  Procedure Laterality Date  . HERNIA REPAIR    . JOINT REPLACEMENT    . LAMINECTOMY      SOCIAL HISTORY:   Social History   Tobacco Use  . Smoking status: Former Smoker    Types: Cigarettes  . Smokeless tobacco: Never Used  Substance Use Topics  . Alcohol use: Yes    Comment: occasionally    FAMILY HISTORY:   Family History  Problem Relation Age of Onset  . Heart attack Mother     DRUG ALLERGIES:   Allergies  Allergen Reactions  . Enalapril Swelling    REVIEW OF SYSTEMS:   Review of Systems  Constitutional: Positive for chills and malaise/fatigue. Negative for fever.  HENT: Negative for sore throat.   Eyes: Negative for blurred vision and double  vision.  Respiratory: Positive for cough, sputum production, shortness of breath and wheezing. Negative for hemoptysis and stridor.   Cardiovascular: Negative for chest pain, palpitations, orthopnea and leg swelling.  Gastrointestinal: Negative for abdominal pain, blood in stool, diarrhea, melena, nausea and vomiting.  Genitourinary: Negative for dysuria, flank pain and hematuria.  Musculoskeletal: Negative for back pain and joint pain.  Skin: Negative for rash.  Neurological: Negative for dizziness, sensory change, focal weakness, seizures, loss of consciousness, weakness and headaches.  Endo/Heme/Allergies: Negative for polydipsia.  Psychiatric/Behavioral: Negative for depression. The patient is not nervous/anxious.     MEDICATIONS AT HOME:   Prior to Admission medications   Medication Sig Start Date End Date Taking? Authorizing Provider  budesonide-formoterol (SYMBICORT) 160-4.5 MCG/ACT inhaler Inhale 2 puffs into the lungs 2 (two) times daily.   Yes [provider]  enalapril (VASOTEC) 5 MG tablet Take 5 mg by mouth daily. 04/22/18  Yes [provider]  hydrochlorothiazide (HYDRODIURIL) 12.5 MG tablet Take 12.5 mg by mouth daily. 05/19/18  Yes [provider]  ipratropium-albuterol (DUONEB) 0.5-2.5 (3) MG/3ML SOLN Take 3 mLs by nebulization every 6 (six) hours. 03/23/16  Yes Epifanio Lesches, MD  tiotropium (SPIRIVA) 18 MCG inhalation capsule Place 18 mcg into inhaler and inhale daily.   Yes [provider]  Red Yeast Rice Extract (  RED YEAST RICE PO) Take 1 tablet by mouth daily.      [provider]      VITAL SIGNS:  Blood pressure (!) 150/76, pulse (!) 106, temperature 98 F (36.7 C), temperature source Oral, resp. rate (!) 26, height 5\' 11"  (1.803 m), weight 196 lb (88.9 kg), SpO2 90 %.  PHYSICAL EXAMINATION:  Physical Exam  GENERAL:  77 y.o.-year-old patient lying in the bed with no acute distress.  EYES: Pupils equal, round,  reactive to light and accommodation. No scleral icterus. Extraocular muscles intact.  HEENT: Head atraumatic, normocephalic. Oropharynx and nasopharynx clear.  NECK:  Supple, no jugular venous distention. No thyroid enlargement, no tenderness.  LUNGS: Crackles and coarse breath sounds bilaterally, no wheezing, rales,rhonchi or crepitation. No use of accessory muscles of respiration.  CARDIOVASCULAR: S1, S2 normal. No murmurs, rubs, or gallops.  ABDOMEN: Soft, nontender, nondistended. Bowel sounds present. No organomegaly or mass.  EXTREMITIES: No pedal edema, cyanosis, or clubbing.  NEUROLOGIC: Cranial nerves II through XII are intact. Muscle strength 5/5 in all extremities. Sensation intact. Gait not checked.  PSYCHIATRIC: The patient is alert and oriented x 3.  SKIN: No obvious rash, lesion, or ulcer.   LABORATORY PANEL:   CBC Recent Labs  Lab 05/23/18 1021  WBC 13.5*  HGB 13.7  HCT 40.7  PLT 268   ------------------------------------------------------------------------------------------------------------------  Chemistries  Recent Labs  Lab 05/23/18 1146  NA 138  K 3.7  CL 98  CO2 29  GLUCOSE 114*  BUN 20  CREATININE 0.81  CALCIUM 8.7*   ------------------------------------------------------------------------------------------------------------------  Cardiac Enzymes Recent Labs  Lab 05/23/18 1146  TROPONINI <0.03   ------------------------------------------------------------------------------------------------------------------  RADIOLOGY:  Dg Chest 2 View  Result Date: 05/23/2018 CLINICAL DATA:  Acute shortness of breath. EXAM: CHEST - 2 VIEW COMPARISON:  05/19/2017 and prior radiographs. 01/15/2018 outside CT FINDINGS: The cardiomediastinal silhouette is unremarkable. LEFT LOWER lobe posterior basilar opacity may represent airspace disease or atelectasis. COPD changes again noted with slightly increasing peribronchial thickening. No pulmonary mass, suspicious  nodule, pleural effusion or pneumothorax identified. No acute bony abnormalities are noted. IMPRESSION: LEFT LOWER lobe posterior basilar opacity - question airspace disease/pneumonia versus atelectasis. Radiographic follow-up to resolution recommended. COPD changes. Electronically Signed   By: Margarette Canada M.D.   On: 05/23/2018 10:20      IMPRESSION AND PLAN:   COPD exacerbation and pneumonia CAP with leukocytosis. The patient will be admitted to medical floor. Continue Zithromax and Rocephin, IV Solu-Medrol and DuoNeb, follow-up cultures and CBC.  Robitussin as needed.  Chronic respiratory failure due to COPD.  Continue home oxygen and nebulizer as needed.  Hypertension.  Continue home hypertension medication Diabetes.  Start sliding scale.   All the records are reviewed and case discussed with ED provider. Management plans discussed with the patient, family and they are in agreement.  CODE STATUS: Full code  TOTAL TIME TAKING CARE OF THIS PATIENT: 42 minutes.    Demetrios Loll M.D on 05/23/2018 at 1:39 PM  Between 7am to 6pm - Pager - [phone]  After 6pm go to [email or web address] - Proofreader  [facility]  Office  [phone]  CC: Primary care physician; Dion Body, MD   Note: This dictation was prepared with Dragon dictation along with smaller phrase technology. Any transcriptional errors that result from this process are unin

## 2018-05-24 DIAGNOSIS — J189 Pneumonia, unspecified organism: Secondary | ICD-10-CM | POA: Diagnosis not present

## 2018-05-24 DIAGNOSIS — J441 Chronic obstructive pulmonary disease with (acute) exacerbation: Secondary | ICD-10-CM | POA: Diagnosis not present

## 2018-05-24 DIAGNOSIS — I10 Essential (primary) hypertension: Secondary | ICD-10-CM | POA: Diagnosis not present

## 2018-05-24 DIAGNOSIS — J9610 Chronic respiratory failure, unspecified whether with hypoxia or hypercapnia: Secondary | ICD-10-CM | POA: Diagnosis not present

## 2018-05-24 LAB — BASIC METABOLIC PANEL
Anion gap: 10 (ref 5–15)
BUN: 23 mg/dL (ref 8–23)
CO2: 27 mmol/L (ref 22–32)
Calcium: 8.8 mg/dL — ABNORMAL LOW (ref 8.9–10.3)
Chloride: 99 mmol/L (ref 98–111)
Creatinine, Ser: 0.87 mg/dL (ref 0.61–1.24)
GFR calc Af Amer: >60 mL/min (ref >60)
GFR calc non Af Amer: >60 mL/min (ref >60)
Glucose, Bld: 179 mg/dL — ABNORMAL HIGH (ref 70–99)
Potassium: 4.6 mmol/L (ref 3.5–5.1)
Sodium: 136 mmol/L (ref 135–145)

## 2018-05-24 LAB — CBC
HCT: 37.1 % — ABNORMAL LOW (ref 40.0–52.0)
Hemoglobin: 12.7 g/dL — ABNORMAL LOW (ref 13.0–18.0)
MCH: 29 pg (ref 26.0–34.0)
MCHC: 34.1 g/dL (ref 32.0–36.0)
MCV: 84.9 fL (ref 80.0–100.0)
Platelets: 278 10*3/uL (ref 150–440)
RBC: 4.37 MIL/uL — ABNORMAL LOW (ref 4.40–5.90)
RDW: 13.6 % (ref 11.5–14.5)
WBC: 9.7 10*3/uL (ref 3.8–10.6)

## 2018-05-24 LAB — GLUCOSE, CAPILLARY
Glucose-Capillary: 157 mg/dL — ABNORMAL HIGH (ref 70–99)
Glucose-Capillary: 135 mg/dL — ABNORMAL HIGH (ref 70–99)

## 2018-05-24 LAB — STREP PNEUMONIAE URINARY ANTIGEN: Strep Pneumo Urinary Antigen: POSITIVE — AB

## 2018-05-24 MED ORDER — PREDNISONE 10 MG (21) PO TBPK: ORAL_TABLET | ORAL | 0 refills | Status: DC

## 2018-05-24 MED ORDER — LEVOFLOXACIN 500 MG PO TABS: 500.0000 mg | ORAL_TABLET | Freq: Every day | ORAL | 0 refills | Status: DC

## 2018-05-24 NOTE — Progress Notes (Signed)
Pt d/ced home.  He walked w/out O2 and desatted down to 87 but recovered quickly w/O2.  He has home O2.  He will be on prednisone taper and oral abx.  Pt will be going home with friend.  Reviewed d/c instructions and f/u appts and medications.  Removed IV.

## 2018-05-26 LAB — CULTURE, RESPIRATORY W GRAM STAIN: Culture: NORMAL

## 2018-05-26 LAB — CULTURE, RESPIRATORY

## 2018-06-01 DIAGNOSIS — Z8701 Personal history of pneumonia (recurrent): Secondary | ICD-10-CM | POA: Diagnosis not present

## 2018-06-07 NOTE — Discharge Summary (Signed)
[facility] NAME: Devin Martinez    MR#:  [id number]  DATE OF BIRTH:  November 27, 1940  DATE OF ADMISSION:  05/23/2018 ADMITTING PHYSICIAN: Demetrios Loll, MD  DATE OF DISCHARGE: 05/24/2018  3:57 PM  PRIMARY CARE PHYSICIAN: Dion Body, MD   ADMISSION DIAGNOSIS:  Respiratory distress [R06.03] COPD exacerbation ([place]) [J44.1] Community acquired pneumonia of left lower lobe of lung ([place]) [J18.1]  DISCHARGE DIAGNOSIS:  Active Problems:   Pneumonia   SECONDARY DIAGNOSIS:   Past Medical History:  Diagnosis Date  . Asthma   . COPD (chronic obstructive pulmonary disease) ([place])   . Degenerative arthritis of left knee   . Diabetes mellitus    Type II  . Herpes   . Hyperlipidemia   . Hypertension   . LVH (left ventricular hypertrophy)    Hx. of  . Obesity   . Smoker      ADMITTING HISTORY  HISTORY OF PRESENT ILLNESS:  Devin Martinez  is a 77 y.o. male with a known history of chronic respiratory failure on home oxygen 1.5 L, COPD, hypertension, diabetes, hyperlipidemia, obesity.  The patient presents the ED with above chief complaints.  He had food poisoning with nausea and vomiting 5 days ago, which resolved.  But the patient developed a cough, shortness of breath and wheezing for the past 2 days.  Chest x-ray show left-sided pneumonia.  HOSPITAL COURSE:   *Community-acquired pneumonia *Acute COPD exacerbation *Acute on chronic hypoxic respiratory failure *Hypertension  Patient was admitted to medical floor.  Started on IV antibiotics, IV steroids and scheduled nebulizers.  Continued home inhalers.  He improved well by the day of discharge.  He has ambulated in the hallway with no significant shortness of breath.  He does have saturations of 87% on room air but has home oxygen that he uses.  Patient is being discharged home on prednisone taper, oral antibiotics.  Follow-up with primary care physician in 1 week.  Other comorbidities have  remained stable.  CONSULTS OBTAINED:    DRUG ALLERGIES:   Allergies  Allergen Reactions  . Enalapril Swelling    DISCHARGE MEDICATIONS:   Allergies as of 05/24/2018      Reactions   Enalapril Swelling      Medication List    TAKE these medications   albuterol 108 (90 Base) MCG/ACT inhaler Commonly known as:  PROVENTIL HFA;VENTOLIN HFA Inhale 1-2 puffs into the lungs every 6 (six) hours as needed for wheezing or shortness of breath.   budesonide-formoterol 160-4.5 MCG/ACT inhaler Commonly known as:  SYMBICORT Inhale 2 puffs into the lungs 2 (two) times daily.   hydrochlorothiazide 12.5 MG tablet Commonly known as:  HYDRODIURIL Take 12.5 mg by mouth daily.   ipratropium-albuterol 0.5-2.5 (3) MG/3ML Soln Commonly known as:  DUONEB Take 3 mLs by nebulization every 6 (six) hours.   levofloxacin 500 MG tablet Commonly known as:  LEVAQUIN Take 1 tablet (500 mg total) by mouth daily.   predniSONE 10 MG (21) Tbpk tablet Commonly known as:  STERAPRED UNI-PAK 21 TAB 6 tabs day 1 and taper 10 mg a day - 6 days   RED YEAST RICE PO Take 1 tablet by mouth daily.   tiotropium 18 MCG inhalation capsule Commonly known as:  SPIRIVA Place 18 mcg into inhaler and inhale daily.       Today   VITAL SIGNS:  Blood pressure (!) 134/58, pulse 64, temperature 98.2 F (36.8 C), temperature source Oral, resp. rate 20,  height 5\' 11"  (1.803 m), weight 88.9 kg, SpO2 94 %.  I/O:  No intake or output data in the 24 hours ending 06/07/18 1620  PHYSICAL EXAMINATION:  Physical Exam  GENERAL:  77 y.o.-year-old patient lying in the bed with no acute distress.  LUNGS: Normal breath sounds bilaterally, no wheezing, rales,rhonchi or crepitation. No use of accessory muscles of respiration.  CARDIOVASCULAR: S1, S2 normal. No murmurs, rubs, or gallops.  ABDOMEN: Soft, non-tender, non-distended. Bowel sounds present. No organomegaly or mass.  NEUROLOGIC: Moves all 4 extremities. PSYCHIATRIC:  The patient is alert and oriented x 3.  SKIN: No obvious rash, lesion, or ulcer.   DATA REVIEW:   CBC No results for input(s): WBC, HGB, HCT, PLT in the last 168 hours.  Chemistries  No results for input(s): NA, K, CL, CO2, GLUCOSE, BUN, CREATININE, CALCIUM, MG, AST, ALT, ALKPHOS, BILITOT in the last 168 hours.  Invalid input(s): GFRCGP  Cardiac Enzymes No results for input(s): TROPONINI in the last 168 hours.  Microbiology Results  Results for orders placed or performed during the hospital encounter of 05/23/18  Culture, sputum-assessment     Status: None   Collection Time: 05/23/18  6:09 PM  Result Value Ref Range Status   Specimen Description SPUTUM  Final   Special Requests NONE  Final   Sputum evaluation   Final    THIS SPECIMEN IS ACCEPTABLE FOR SPUTUM CULTURE Performed at [facility], [address], [place], [place] [address]    Report Status 05/23/2018 FINAL  Final  Culture, respiratory     Status: None   Collection Time: 05/23/18  6:09 PM  Result Value Ref Range Status   Specimen Description   Final    SPUTUM Performed at [facility], [address], [place], [place] [address]    Special Requests   Final    NONE Reflexed from [id number] Performed at [facility], [address] [place], [place] [address]    Gram Stain   Final    ABUNDANT WBC PRESENT, PREDOMINANTLY PMN RARE GRAM POSITIVE COCCI IN CLUSTERS RARE GRAM NEGATIVE RODS    Culture   Final    Consistent with normal respiratory flora. Performed at [facility], [place] [address], [place], [place] [address]    Report Status 05/26/2018 FINAL  Final    RADIOLOGY:  No results found.  Follow up with PCP in 1 week.  Management plans discussed with the patient, family and they are in agreement.  CODE STATUS:  Code Status History    Date Active Date Inactive Code Status Order ID Comments User Context   05/23/2018 1419 05/24/2018 1940 Full Code [id number]  Demetrios Loll, MD  Inpatient   03/16/2016 1703 03/23/2016 1425 Full Code [id number]  Nicholes Mango, MD Inpatient    Advance Directive Documentation     Most Recent Value  Type of Advance Directive  Healthcare Power of Attorney, Living will  Pre-existing out of facility DNR order (yellow form or pink MOST form)  -  "MOST" Form in Place?  -      TOTAL TIME TAKING CARE OF THIS PATIENT ON DAY OF DISCHARGE: more than 30 minutes.   Neita Carp M.D on 06/07/2018 at 4:20 PM  Between 7am to 6pm - Pager - [phone]  After 6pm go to [email or web address] - password [email or web address] [facility]  Office  [phone]  CC: Primary care physician; Dion Body, MD  Note: This dictation was prepared with Dragon dictation  along with smaller phrase technology. Any transcriptional errors that result from this process are unintentional.

## 2018-06-14 ENCOUNTER — Other Ambulatory Visit: Payer: Self-pay | Admitting: *Deleted

## 2018-06-14 ENCOUNTER — Telehealth: Payer: Self-pay | Admitting: Pulmonary Disease

## 2018-06-14 DIAGNOSIS — J441 Chronic obstructive pulmonary disease with (acute) exacerbation: Secondary | ICD-10-CM | POA: Diagnosis not present

## 2018-06-14 DIAGNOSIS — J9611 Chronic respiratory failure with hypoxia: Secondary | ICD-10-CM

## 2018-06-14 NOTE — Telephone Encounter (Signed)
Pt advised with previous message. Order placed for ONO. Nothing further needed.

## 2018-06-14 NOTE — Telephone Encounter (Signed)
Pt states he has not used his oxygen in a couple of weeks. He asks if it is ok to get an order to have it cancelled. Please call to discuss.

## 2018-06-14 NOTE — Telephone Encounter (Signed)
My recommendation is that he continue to wear it with sleep for now. We will order an overnight oximetry on RA. When results of that test are available to [place], I will make further recommendations  Waunita Schooner

## 2018-06-16 ENCOUNTER — Ambulatory Visit: Payer: PPO | Admitting: Pulmonary Disease

## 2018-06-22 DIAGNOSIS — Z8701 Personal history of pneumonia (recurrent): Secondary | ICD-10-CM | POA: Diagnosis not present

## 2018-06-22 DIAGNOSIS — R911 Solitary pulmonary nodule: Secondary | ICD-10-CM | POA: Diagnosis not present

## 2018-06-25 DIAGNOSIS — R0902 Hypoxemia: Secondary | ICD-10-CM | POA: Diagnosis not present

## 2018-06-25 DIAGNOSIS — J449 Chronic obstructive pulmonary disease, unspecified: Secondary | ICD-10-CM | POA: Diagnosis not present

## 2018-07-14 ENCOUNTER — Telehealth: Payer: Self-pay | Admitting: *Deleted

## 2018-07-14 NOTE — Telephone Encounter (Signed)
Called patient with results of ONO. Lowest reading 66 Highest reading 96 Baseline 90  Pt has 02 in the home and does not use it. He says he will think about it. Nothing further needed.

## 2018-07-15 DIAGNOSIS — J441 Chronic obstructive pulmonary disease with (acute) exacerbation: Secondary | ICD-10-CM | POA: Diagnosis not present

## 2018-07-23 ENCOUNTER — Telehealth: Payer: Self-pay | Admitting: Pulmonary Disease

## 2018-07-23 NOTE — Telephone Encounter (Signed)
Please call pt regarding sleep test faxed to [facility] in [place]. Please call them 1st at [phone]

## 2018-07-23 NOTE — Telephone Encounter (Signed)
Spoke to patient, he wants his ONO results sent to the [place] because he wants them to make a recommendation of a concentrator. I told him we may do that but he needs to sign a release of records for [place] to send the results. Patient aware he needs to stop by the office and sign the release. Pt was not pleased with this answer, but agreed to stop by and sign it so the [place] can give him their recommendations. No further questions at this time.

## 2018-07-30 NOTE — Telephone Encounter (Signed)
Signed release received 07/26/18. ONO results faxed to [facility] 07/30/18, [phone].

## 2018-08-14 DIAGNOSIS — J441 Chronic obstructive pulmonary disease with (acute) exacerbation: Secondary | ICD-10-CM | POA: Diagnosis not present

## 2018-08-25 IMAGING — CT CT OUTSIDE FILMS CHEST
3 of 6 series · 16 of 32 positions shown, 18 images · non-contrast
Comparison: none

[Series 3: 1.25 chest thins · axial · 0.80mm/px · z∈[-294,-27]mm · 7 of 599 slices shown, 9 images]
[im 86/599  mediastinal]
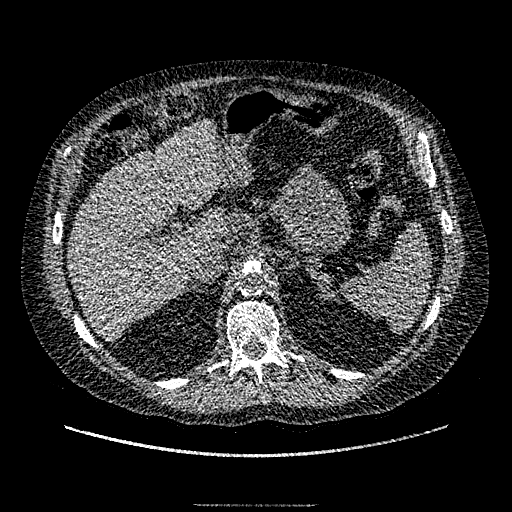
[im 86/599  lung]
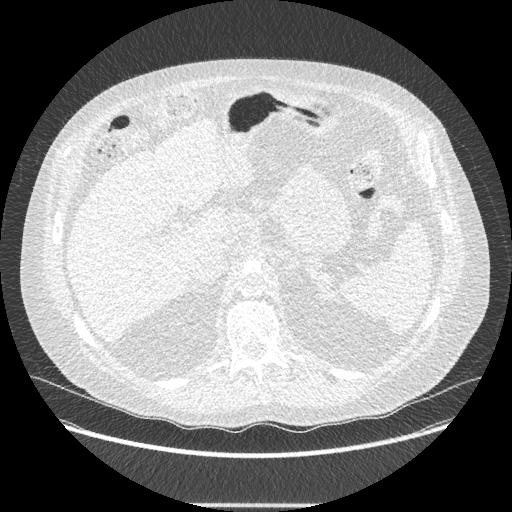
[im 171/599  lung]
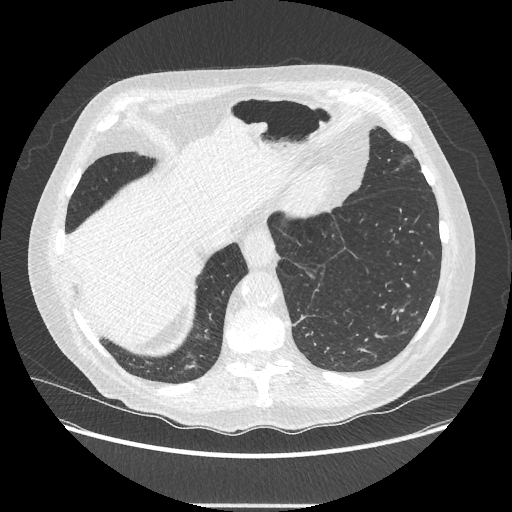
[im 257/599  lung]
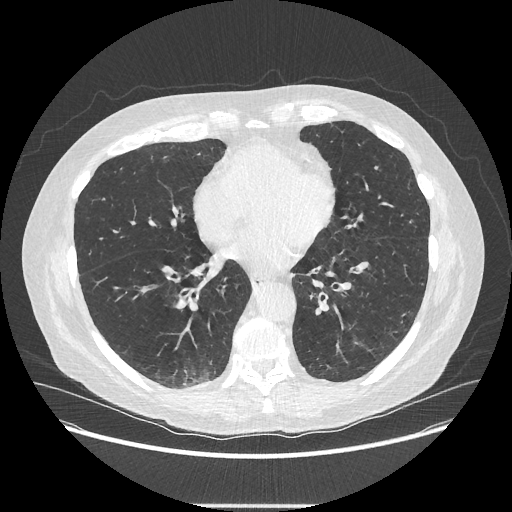
[im 300/599  lung]
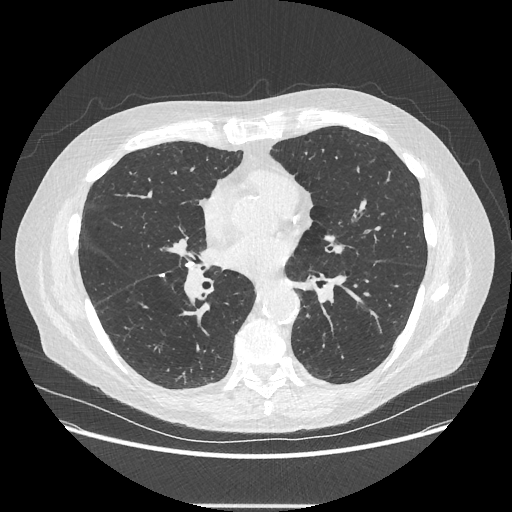
[im 342/599  mediastinal]
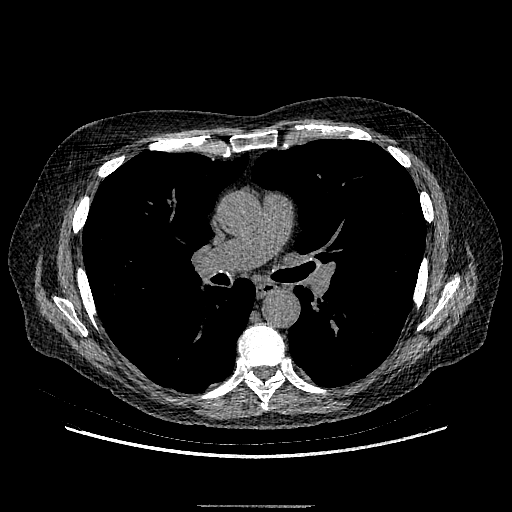
[im 342/599  lung]
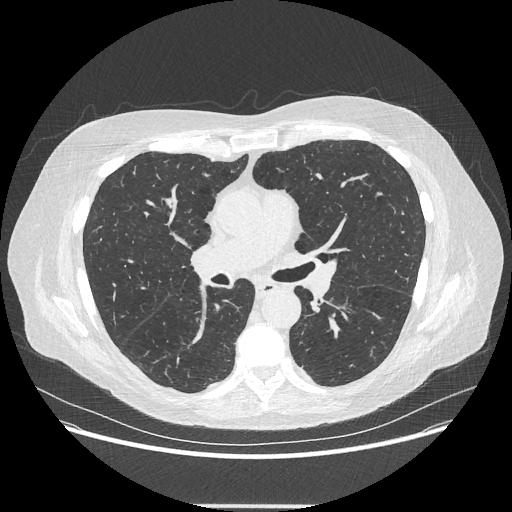
[im 428/599  lung]
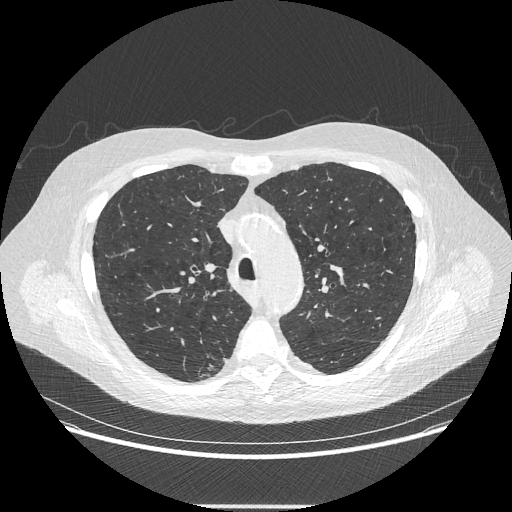
[im 513/599  lung]
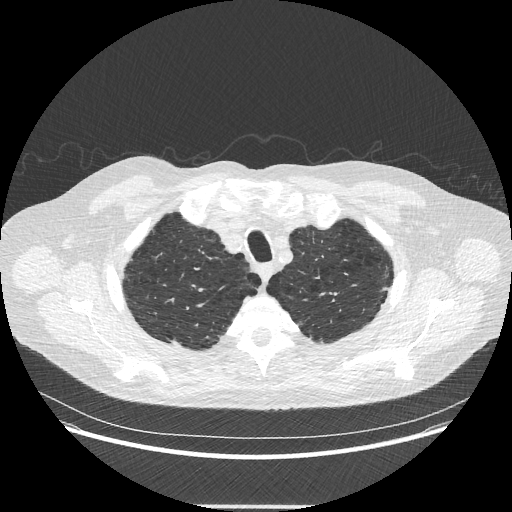

[Series 601: sag 1 25 · sagittal · 0.80mm/px · 2 of 330 slices shown]
[im 110/330  lung]
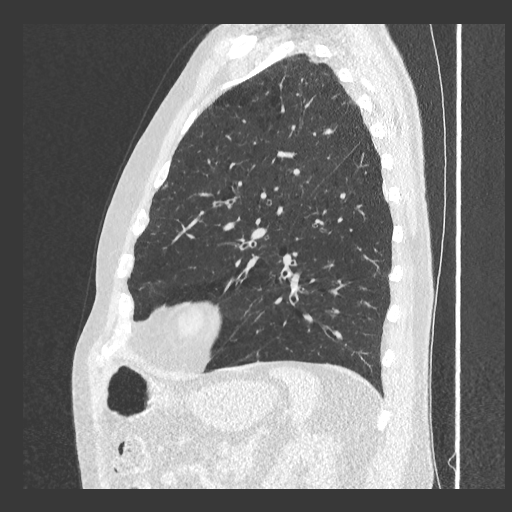
[im 220/330  lung]
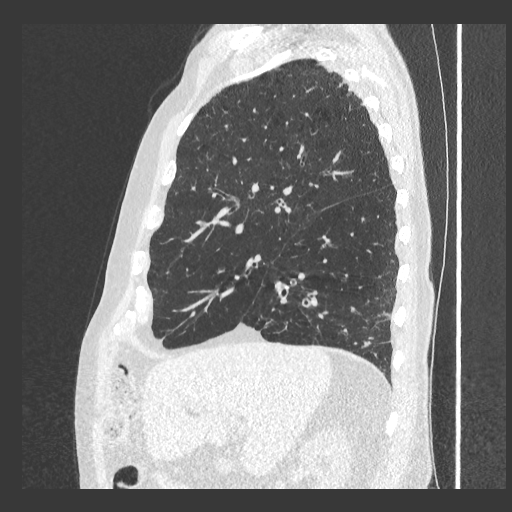

[Series 2004: clearread ct vessel suppress · axial · 0.80mm/px · z∈[-294,-27]mm · 7 of 599 slices shown]
[im 86/599  lung]
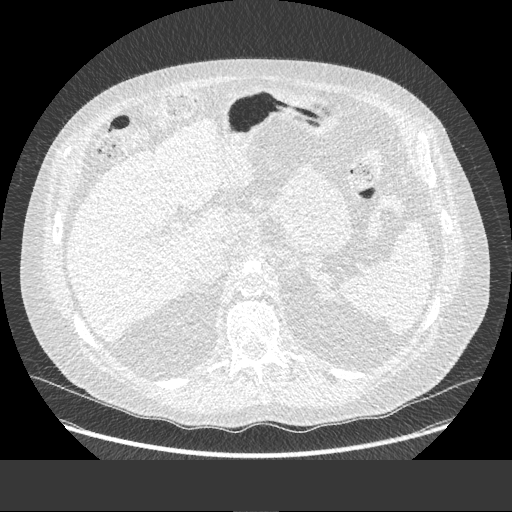
[im 171/599  lung]
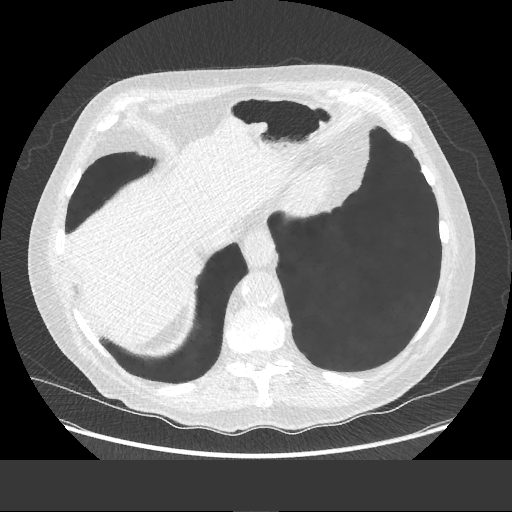
[im 257/599  lung]
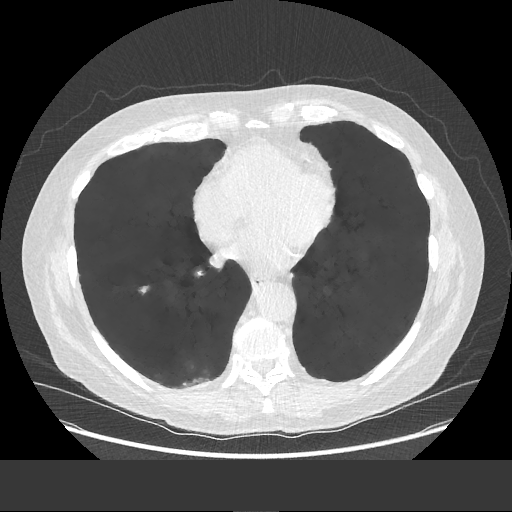
[im 300/599  lung]
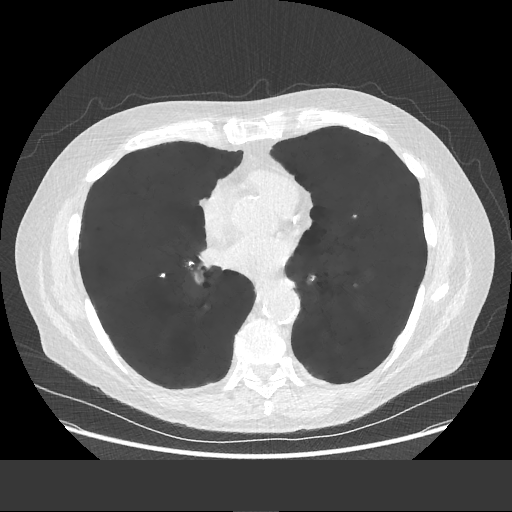
[im 342/599  lung]
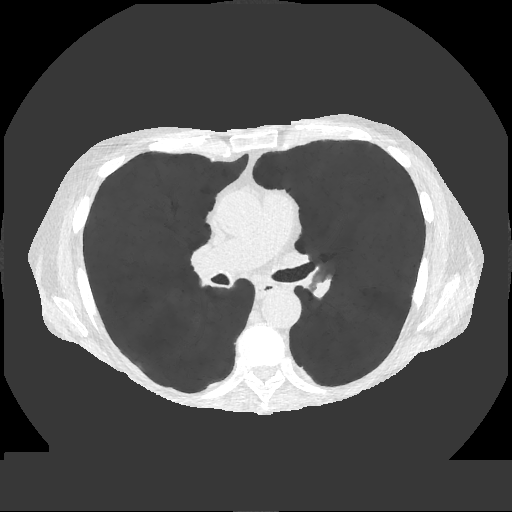
[im 428/599  lung]
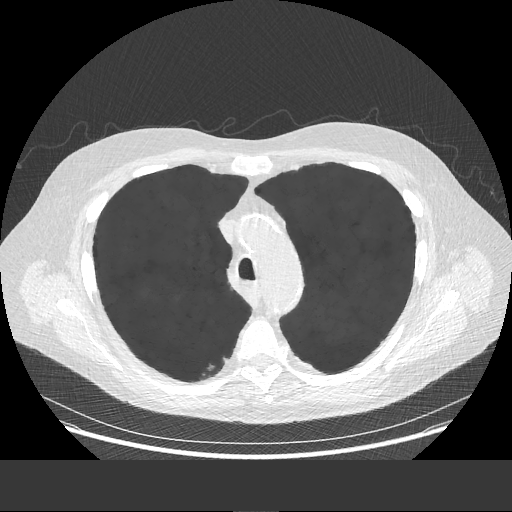
[im 513/599  lung]
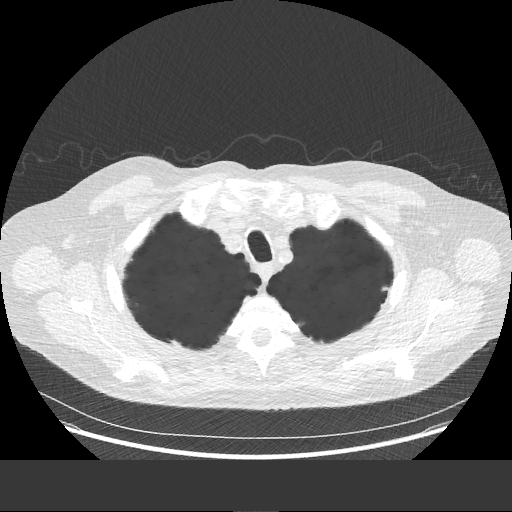

[16 of 32 positions shown; findings below may reference images not displayed]

Canned report from images found in remote index.

Refer to host system for actual result text.

## 2018-09-02 ENCOUNTER — Ambulatory Visit: Payer: PPO | Admitting: Pulmonary Disease

## 2018-09-09 DIAGNOSIS — I10 Essential (primary) hypertension: Secondary | ICD-10-CM | POA: Diagnosis not present

## 2018-09-09 DIAGNOSIS — R7303 Prediabetes: Secondary | ICD-10-CM | POA: Diagnosis not present

## 2018-09-09 DIAGNOSIS — E782 Mixed hyperlipidemia: Secondary | ICD-10-CM | POA: Diagnosis not present

## 2018-09-14 DIAGNOSIS — J441 Chronic obstructive pulmonary disease with (acute) exacerbation: Secondary | ICD-10-CM | POA: Diagnosis not present

## 2018-09-17 DIAGNOSIS — E782 Mixed hyperlipidemia: Secondary | ICD-10-CM | POA: Diagnosis not present

## 2018-09-17 DIAGNOSIS — I10 Essential (primary) hypertension: Secondary | ICD-10-CM | POA: Diagnosis not present

## 2018-09-17 DIAGNOSIS — R7303 Prediabetes: Secondary | ICD-10-CM | POA: Diagnosis not present

## 2018-09-17 DIAGNOSIS — Z0000 Encounter for general adult medical examination without abnormal findings: Secondary | ICD-10-CM | POA: Diagnosis not present

## 2018-10-01 ENCOUNTER — Ambulatory Visit (INDEPENDENT_AMBULATORY_CARE_PROVIDER_SITE_OTHER): Payer: PPO | Admitting: Pulmonary Disease

## 2018-10-01 ENCOUNTER — Encounter: Payer: Self-pay | Admitting: Pulmonary Disease

## 2018-10-01 VITALS — BP 118/80 | HR 61 | Ht 71.0 in | Wt 202.0 lb

## 2018-10-01 DIAGNOSIS — R0609 Other forms of dyspnea: Secondary | ICD-10-CM

## 2018-10-01 DIAGNOSIS — R600 Localized edema: Secondary | ICD-10-CM | POA: Diagnosis not present

## 2018-10-01 DIAGNOSIS — R0600 Dyspnea, unspecified: Secondary | ICD-10-CM

## 2018-10-01 DIAGNOSIS — G4734 Idiopathic sleep related nonobstructive alveolar hypoventilation: Secondary | ICD-10-CM

## 2018-10-01 DIAGNOSIS — J449 Chronic obstructive pulmonary disease, unspecified: Secondary | ICD-10-CM | POA: Diagnosis not present

## 2018-10-01 NOTE — Progress Notes (Signed)
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 No increased metabolic activity   INTERVAL HISTORY: Last visit 03/13/2018.  No major pulmonary events since in the interim  SUBJ: This is a scheduled follow-up.  He has no new complaints.  However, he has noted a gradually progressive exertional dyspnea.  He remains on Symbicort and Spiriva inhalers.  He uses DuoNeb up to 3 times per day.  He also uses his rescue inhaler 1-2 times per day.  He is not consistently wearing nocturnal oxygen.  He does note mild intermittent ankle edema.  Otherwise, he denies CP, fever, purulent sputum, hemoptysis and calf tenderness.   OBJ: Vitals:   10/01/18 1004 10/01/18 1007  BP:  118/80  Pulse:  61  SpO2:  95%  Weight: 202 lb (91.6 kg)   Height: 5\' 11"  (1.803 m)   RA  Gen: NAD at rest HEENT: NCAT, sclera white Neck: No JVD noted  Lungs: breath sounds diminished with no wheezes but faint bibasilar crackles Cardiovascular: RRR, no murmurs Abdomen: Soft, nontender, normal BS Ext: without clubbing, cyanosis.  Trace symmetric ankle edema Neuro: No focal deficits noted on limited exam Skin: Limited exam, no lesions noted     DATA: BMP Latest Ref Rng & Units 05/24/2018 05/23/2018 03/23/2016  Glucose 70 - 99 mg/dL 179(H) 114(H) -  BUN 8 - 23 mg/dL 23 20 -  Creatinine 0.61 - 1.24 mg/dL 0.87 0.81 0.78  Sodium 135 - 145 mmol/L 136 138 -  Potassium 3.5 - 5.1 mmol/L 4.6 3.7 -  Chloride 98 - 111 mmol/L 99 98 -  CO2 22 - 32 mmol/L 27 29 -  Calcium 8.9 -  10.3 mg/dL 8.8(L) 8.7(L) -   CBC Latest Ref Rng & Units 05/24/2018 05/23/2018 03/20/2016  WBC 3.8 - 10.6 K/uL 9.7 13.5(H) 12.4(H)  Hemoglobin 13.0 - 18.0 g/dL 12.7(L) 13.7 13.2  Hematocrit 40.0 - 52.0 % 37.1(L) 40.7 40.3  Platelets 150 - 440 K/uL 278 268 292    CXR 05/23/18: Vague retrocardiac density CXR 06/22/18 (report only): No acute findings   IMPRESSION: 1) very severe COPD by PFTs-previously well compensated.  Now with mildly progressive exertional dyspnea and intermittent lower extremity edema 2) History of asbestos exposure without definitive evidence of asbestosis or pleural disease 3) nocturnal hypoxemia.  Not fully compliant with nocturnal oxygen prescription    PLAN: Blood tests ordered today: BNP (to screen for potential congestive heart failure)  He may get this test done next week if preferred  We will contact him with results and any further evaluation if necessary  Continue Symbicort, Spiriva as maintenance medications  Continue albuterol inhaler or nebulizer as needed  Continue oxygen therapy with sleep.  Encouraged to improved compliance  Follow-up in 6 months.  Call sooner if needed  Merton Border, MD PCCM service Mobile [phone] Pager [phone] 10/01/2018 1:18 PM

## 2018-10-01 NOTE — Patient Instructions (Addendum)
Blood test today: BNP (to screen for potential congestive heart failure)  You may get this test done next week if preferred  We will contact you with results and any further evaluation if necessary  Continue Symbicort, Spiriva as maintenance medications  Continue albuterol inhaler or nebulizer as needed  Continue oxygen therapy with sleep  Follow-up in 6 months.  Call sooner if needed

## 2018-10-12 ENCOUNTER — Telehealth: Payer: Self-pay | Admitting: Pulmonary Disease

## 2018-10-13 ENCOUNTER — Other Ambulatory Visit
Admission: RE | Admit: 2018-10-13 | Discharge: 2018-10-13 | Disposition: A | Discharge status: Home / Self Care | Payer: PPO | Source: Ambulatory Visit | Attending: Pulmonary Disease | Admitting: Pulmonary Disease

## 2018-10-13 DIAGNOSIS — R0609 Other forms of dyspnea: Secondary | ICD-10-CM | POA: Diagnosis not present

## 2018-10-13 DIAGNOSIS — R0600 Dyspnea, unspecified: Secondary | ICD-10-CM

## 2018-10-13 LAB — BRAIN NATRIURETIC PEPTIDE: B Natriuretic Peptide: 34 pg/mL (ref 0.0–100.0)

## 2018-10-13 MED ORDER — AZITHROMYCIN 250 MG PO TABS: ORAL_TABLET | ORAL | 0 refills | Status: AC

## 2018-10-13 MED ORDER — PREDNISONE 20 MG PO TABS: 40.0000 mg | ORAL_TABLET | Freq: Every day | ORAL | 0 refills | Status: AC

## 2018-10-13 NOTE — Addendum Note (Signed)
Addended by: Santiago Bur on: 10/13/2018 09:26 AM   Modules accepted: Orders

## 2018-10-13 NOTE — Telephone Encounter (Signed)
rx sent for Prednisone and zpak. Left message for patient to call back.

## 2018-10-13 NOTE — Telephone Encounter (Signed)
Prednisone 40 mg daily for 7 days z pak 

## 2018-10-14 DIAGNOSIS — J441 Chronic obstructive pulmonary disease with (acute) exacerbation: Secondary | ICD-10-CM | POA: Diagnosis not present

## 2018-11-14 DIAGNOSIS — J441 Chronic obstructive pulmonary disease with (acute) exacerbation: Secondary | ICD-10-CM | POA: Diagnosis not present

## 2018-12-15 DIAGNOSIS — J441 Chronic obstructive pulmonary disease with (acute) exacerbation: Secondary | ICD-10-CM | POA: Diagnosis not present

## 2019-02-09 ENCOUNTER — Telehealth: Payer: Self-pay | Admitting: Pulmonary Disease

## 2019-02-09 NOTE — Telephone Encounter (Signed)
Called and spoke to pt.  Pt reports of increased sob with exertion x3-4d. Using albuterol neb TID with some relief.  Pt currently using 1.5L PRN and QHS.  He has been monitoring his oxygen levels. On room air levels are staying around 92%.  Pt states he does have an occ prod cough with clear to yellowish mucus, however this is his normal.  Denied fever, chills or sweats.  Pt aware that DS is currently working in the unit, and our office will contact him with a response ASAP. Pt voiced his understanding.    DS please advise.

## 2019-02-13 DIAGNOSIS — J441 Chronic obstructive pulmonary disease with (acute) exacerbation: Secondary | ICD-10-CM | POA: Diagnosis not present

## 2019-02-14 NOTE — Telephone Encounter (Signed)
Spoke to pt, who states his sx have improved and he is now at his baseline.  Pt stated that he did not want to take any medication unless absolutely needed.  Advised pt to contact our office if his sx worsen. Pt voiced his understanding. Nothing further is needed.

## 2019-02-14 NOTE — Telephone Encounter (Signed)
lmtcb x1 for pt. 

## 2019-02-14 NOTE — Telephone Encounter (Signed)
Call to see how he is doing. If he is still struggling, please order prednisone 40 mg daily x 5 days  Thanks  Waunita Schooner

## 2019-03-10 DIAGNOSIS — D692 Other nonthrombocytopenic purpura: Secondary | ICD-10-CM | POA: Diagnosis not present

## 2019-03-10 DIAGNOSIS — J441 Chronic obstructive pulmonary disease with (acute) exacerbation: Secondary | ICD-10-CM | POA: Diagnosis not present

## 2019-03-10 DIAGNOSIS — L570 Actinic keratosis: Secondary | ICD-10-CM | POA: Diagnosis not present

## 2019-03-10 DIAGNOSIS — L578 Other skin changes due to chronic exposure to nonionizing radiation: Secondary | ICD-10-CM | POA: Diagnosis not present

## 2019-03-10 DIAGNOSIS — L821 Other seborrheic keratosis: Secondary | ICD-10-CM | POA: Diagnosis not present

## 2019-03-15 DIAGNOSIS — J441 Chronic obstructive pulmonary disease with (acute) exacerbation: Secondary | ICD-10-CM | POA: Diagnosis not present

## 2019-03-18 DIAGNOSIS — R7303 Prediabetes: Secondary | ICD-10-CM | POA: Diagnosis not present

## 2019-03-18 DIAGNOSIS — E782 Mixed hyperlipidemia: Secondary | ICD-10-CM | POA: Diagnosis not present

## 2019-03-18 DIAGNOSIS — I10 Essential (primary) hypertension: Secondary | ICD-10-CM | POA: Diagnosis not present

## 2019-04-01 DIAGNOSIS — E782 Mixed hyperlipidemia: Secondary | ICD-10-CM | POA: Diagnosis not present

## 2019-04-01 DIAGNOSIS — Z0000 Encounter for general adult medical examination without abnormal findings: Secondary | ICD-10-CM | POA: Diagnosis not present

## 2019-04-01 DIAGNOSIS — R7303 Prediabetes: Secondary | ICD-10-CM | POA: Diagnosis not present

## 2019-04-01 DIAGNOSIS — I10 Essential (primary) hypertension: Secondary | ICD-10-CM | POA: Diagnosis not present

## 2019-04-14 DIAGNOSIS — Z20828 Contact with and (suspected) exposure to other viral communicable diseases: Secondary | ICD-10-CM | POA: Diagnosis not present

## 2019-04-15 DIAGNOSIS — J441 Chronic obstructive pulmonary disease with (acute) exacerbation: Secondary | ICD-10-CM | POA: Diagnosis not present

## 2019-05-20 DIAGNOSIS — J441 Chronic obstructive pulmonary disease with (acute) exacerbation: Secondary | ICD-10-CM | POA: Diagnosis not present

## 2019-05-24 ENCOUNTER — Telehealth: Payer: Self-pay | Admitting: Pulmonary Disease

## 2019-05-24 NOTE — Telephone Encounter (Signed)
Spoke to pt who states that the [place] never received his ONO report back in October when it was faxed. Pt was advised that his record release has expired and he will need to come by to sign a new one. Pt verbalized understanding and will stop by to sign a release. Copy of ONO and record release is at the front desk ready for him to sign.

## 2019-05-25 NOTE — Telephone Encounter (Signed)
Pt came in today and signed a medical release of information form. ONO has been re-faxed to [facility] and [phone].

## 2019-06-08 ENCOUNTER — Telehealth: Payer: Self-pay | Admitting: Pulmonary Disease

## 2019-06-09 NOTE — Telephone Encounter (Signed)
Unable to reach pt and unable to leave message. Request has been sent to Dr.Simonds to advise.

## 2019-06-10 NOTE — Telephone Encounter (Signed)
Pt called in to say that he would like to keep the oxygen concentrator that he has. He is also wanting to keep the one he has from the [place]. So please do not cancel the RX from [facility] He is currently using the one from [facility] as the one from the [place] puts out too much heat. States that the one from [place] is too hot. Please call if you have any questions CB# [phone]

## 2019-06-10 NOTE — Telephone Encounter (Signed)
Spoke with pt and he does not like the concentrator that the [place] sent as it puts off to much heat. Pt wishes to keep the one from [facility]. Advised pt that is fine as we have not sent the cancellation order yet.No further actions necessary at this time.

## 2019-06-14 DIAGNOSIS — H00016 Hordeolum externum left eye, unspecified eyelid: Secondary | ICD-10-CM | POA: Diagnosis not present

## 2019-06-14 DIAGNOSIS — J441 Chronic obstructive pulmonary disease with (acute) exacerbation: Secondary | ICD-10-CM | POA: Diagnosis not present

## 2019-06-14 DIAGNOSIS — H00013 Hordeolum externum right eye, unspecified eyelid: Secondary | ICD-10-CM | POA: Diagnosis not present

## 2019-06-15 ENCOUNTER — Telehealth: Payer: Self-pay | Admitting: Internal Medicine

## 2019-06-15 ENCOUNTER — Telehealth: Payer: Self-pay

## 2019-06-15 NOTE — Telephone Encounter (Signed)
Pt has been scheduled for OV on 06/28/2019. Nothing further is needed.

## 2019-06-15 NOTE — Telephone Encounter (Signed)
Received oxygen re certification from [facility].  Pt is past due for an appointment.  Left message to scheduled pt for an appointment.

## 2019-06-15 NOTE — Telephone Encounter (Signed)
error 

## 2019-06-28 ENCOUNTER — Other Ambulatory Visit: Payer: Self-pay

## 2019-06-28 ENCOUNTER — Encounter: Payer: Self-pay | Admitting: Pulmonary Disease

## 2019-06-28 ENCOUNTER — Ambulatory Visit (INDEPENDENT_AMBULATORY_CARE_PROVIDER_SITE_OTHER): Payer: PPO | Admitting: Pulmonary Disease

## 2019-06-28 VITALS — BP 140/64 | HR 81 | Temp 98.4°F | Ht 71.0 in | Wt 199.0 lb

## 2019-06-28 DIAGNOSIS — J441 Chronic obstructive pulmonary disease with (acute) exacerbation: Secondary | ICD-10-CM

## 2019-06-28 DIAGNOSIS — J9611 Chronic respiratory failure with hypoxia: Secondary | ICD-10-CM | POA: Diagnosis not present

## 2019-06-28 DIAGNOSIS — J449 Chronic obstructive pulmonary disease, unspecified: Secondary | ICD-10-CM

## 2019-06-28 MED ORDER — PREDNISONE 20 MG PO TABS: 40.0000 mg | ORAL_TABLET | Freq: Every day | ORAL | 0 refills | Status: AC

## 2019-06-28 NOTE — Progress Notes (Signed)
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 No increased metabolic activity   INTERVAL HISTORY: Last visit 10/01/18.  No major pulmonary events in the interim  SUBJ: This is a scheduled follow-up.  Overall, he has been about the same.  However, in the past 6 weeks he is noted increasing eye watering, allergy symptoms and increased shortness of breath.  He notes that he is worse when he is in his home and does better when he is out of the home.  He is scheduled to see an allergist in the next couple weeks.  He denies fever, chest pain, hemoptysis, purulent sputum.  He has chronic lower extremity edema, not changed from previously.  He denies calf pain.  He has no orthopnea and no paroxysmal nocturnal dyspnea.  He reports that he underwent LDCT at the [facility] and was told that everything looks okay.  OBJ: Vitals:   06/28/19 1021  BP: 140/64  Pulse: 81  Temp: 98.4 F (36.9 C)  SpO2: 96%  Weight: 199 lb (90.3 kg)  Height: 5\' 11"  (1.803 m)  RA  Gen: NAD HEENT: NCAT, sclerae white Neck: No JVD Lungs: breath sounds moderately diminished with distant scattered expiratory wheezes Cardiovascular: RRR, no murmurs Abdomen: Soft, nontender, normal BS Ext: without clubbing, cyanosis. 1+ LE edema R slightly > L Neuro: grossly intact Skin: Limited exam, no lesions noted     DATA: BMP Latest Ref Rng & Units 05/24/2018 05/23/2018 03/23/2016  Glucose 70 - 99 mg/dL 179(H) 114(H) -  BUN 8 - 23 mg/dL 23 20 -  Creatinine 0.61 -  1.24 mg/dL 0.87 0.81 0.78  Sodium 135 - 145 mmol/L 136 138 -  Potassium 3.5 - 5.1 mmol/L 4.6 3.7 -  Chloride 98 - 111 mmol/L 99 98 -  CO2 22 - 32 mmol/L 27 29 -  Calcium 8.9 - 10.3 mg/dL 8.8(L) 8.7(L) -   CBC Latest Ref Rng & Units 05/24/2018 05/23/2018 03/20/2016  WBC 3.8 - 10.6 K/uL 9.7 13.5(H) 12.4(H)  Hemoglobin 13.0 - 18.0 g/dL 12.7(L) 13.7 13.2  Hematocrit 40.0 - 52.0 % 37.1(L) 40.7 40.3  Platelets 150 - 440 K/uL 278 268 292   CXR: No new film   IMPRESSION: COPD, severe (HCC)  COPD exacerbation (HCC)  Chronic hypoxemic respiratory failure (HCC)      PLAN: New prescription: Prednisone 40 mg (2 x 20 mg) daily for 5 days Continue Symbicort and Spiriva inhalers as previously prescribed Continue albuterol inhaler or DuoNeb as needed  Continue oxygen therapy with sleep and as needed with exertion during the day Follow-up in 3 months with Dr. Mortimer Fries.  Call sooner if needed  Merton Border, MD PCCM service Mobile [phone] Pager [phone] 06/28/2019 10:34 AM

## 2019-06-28 NOTE — Patient Instructions (Addendum)
New prescription: Prednisone 40 mg (2 x 20 mg) daily for 5 days Continue Symbicort and Spiriva inhalers as previously prescribed Continue albuterol inhaler or DuoNeb as needed for increased shortness of breath, wheezing, chest tightness, cough Continue oxygen therapy with sleep and as needed with exertion during the day Follow-up in 3 months with Dr. Mortimer Fries.  Call sooner if needed

## 2019-07-06 DIAGNOSIS — J441 Chronic obstructive pulmonary disease with (acute) exacerbation: Secondary | ICD-10-CM | POA: Diagnosis not present

## 2019-07-08 DIAGNOSIS — M79645 Pain in left finger(s): Secondary | ICD-10-CM | POA: Diagnosis not present

## 2019-07-08 DIAGNOSIS — M79644 Pain in right finger(s): Secondary | ICD-10-CM | POA: Diagnosis not present

## 2019-07-08 DIAGNOSIS — M180 Bilateral primary osteoarthritis of first carpometacarpal joints: Secondary | ICD-10-CM | POA: Diagnosis not present

## 2019-07-12 DIAGNOSIS — J441 Chronic obstructive pulmonary disease with (acute) exacerbation: Secondary | ICD-10-CM | POA: Diagnosis not present

## 2019-07-29 DIAGNOSIS — E782 Mixed hyperlipidemia: Secondary | ICD-10-CM | POA: Diagnosis not present

## 2019-07-29 DIAGNOSIS — I10 Essential (primary) hypertension: Secondary | ICD-10-CM | POA: Diagnosis not present

## 2019-07-29 DIAGNOSIS — R7303 Prediabetes: Secondary | ICD-10-CM | POA: Diagnosis not present

## 2019-08-05 DIAGNOSIS — J301 Allergic rhinitis due to pollen: Secondary | ICD-10-CM | POA: Diagnosis not present

## 2019-08-09 DIAGNOSIS — I10 Essential (primary) hypertension: Secondary | ICD-10-CM | POA: Diagnosis not present

## 2019-08-09 DIAGNOSIS — Z136 Encounter for screening for cardiovascular disorders: Secondary | ICD-10-CM | POA: Diagnosis not present

## 2019-08-09 DIAGNOSIS — J449 Chronic obstructive pulmonary disease, unspecified: Secondary | ICD-10-CM | POA: Diagnosis not present

## 2019-08-15 DIAGNOSIS — J441 Chronic obstructive pulmonary disease with (acute) exacerbation: Secondary | ICD-10-CM | POA: Diagnosis not present

## 2019-09-15 DIAGNOSIS — J441 Chronic obstructive pulmonary disease with (acute) exacerbation: Secondary | ICD-10-CM | POA: Diagnosis not present

## 2019-10-18 ENCOUNTER — Ambulatory Visit: Payer: PPO | Attending: Internal Medicine

## 2019-10-18 DIAGNOSIS — Z20822 Contact with and (suspected) exposure to covid-19: Secondary | ICD-10-CM

## 2019-10-18 DIAGNOSIS — Z20828 Contact with and (suspected) exposure to other viral communicable diseases: Secondary | ICD-10-CM | POA: Diagnosis not present

## 2019-10-19 LAB — NOVEL CORONAVIRUS, NAA: SARS-CoV-2, NAA: NOT DETECTED

## 2019-12-14 DIAGNOSIS — Z136 Encounter for screening for cardiovascular disorders: Secondary | ICD-10-CM | POA: Diagnosis not present

## 2019-12-14 DIAGNOSIS — I10 Essential (primary) hypertension: Secondary | ICD-10-CM | POA: Diagnosis not present

## 2019-12-22 DIAGNOSIS — J449 Chronic obstructive pulmonary disease, unspecified: Secondary | ICD-10-CM | POA: Diagnosis not present

## 2019-12-22 DIAGNOSIS — E7800 Pure hypercholesterolemia, unspecified: Secondary | ICD-10-CM | POA: Diagnosis not present

## 2019-12-22 DIAGNOSIS — I10 Essential (primary) hypertension: Secondary | ICD-10-CM | POA: Diagnosis not present

## 2019-12-22 DIAGNOSIS — Z0000 Encounter for general adult medical examination without abnormal findings: Secondary | ICD-10-CM | POA: Diagnosis not present

## 2020-01-31 ENCOUNTER — Ambulatory Visit (INDEPENDENT_AMBULATORY_CARE_PROVIDER_SITE_OTHER): Payer: No Typology Code available for payment source | Admitting: Allergy and Immunology

## 2020-01-31 ENCOUNTER — Encounter: Payer: Self-pay | Admitting: Allergy and Immunology

## 2020-01-31 ENCOUNTER — Other Ambulatory Visit: Payer: Self-pay

## 2020-01-31 VITALS — BP 160/60 | HR 69 | Temp 97.6°F | Resp 18 | Ht 70.0 in | Wt 206.0 lb

## 2020-01-31 DIAGNOSIS — H1045 Other chronic allergic conjunctivitis: Secondary | ICD-10-CM | POA: Diagnosis not present

## 2020-01-31 DIAGNOSIS — J301 Allergic rhinitis due to pollen: Secondary | ICD-10-CM

## 2020-01-31 DIAGNOSIS — H1044 Vernal conjunctivitis: Secondary | ICD-10-CM | POA: Diagnosis not present

## 2020-01-31 DIAGNOSIS — J3089 Other allergic rhinitis: Secondary | ICD-10-CM | POA: Diagnosis not present

## 2020-01-31 DIAGNOSIS — R0902 Hypoxemia: Secondary | ICD-10-CM

## 2020-01-31 DIAGNOSIS — J449 Chronic obstructive pulmonary disease, unspecified: Secondary | ICD-10-CM

## 2020-01-31 DIAGNOSIS — H1010 Acute atopic conjunctivitis, unspecified eye: Secondary | ICD-10-CM | POA: Diagnosis not present

## 2020-01-31 DIAGNOSIS — H1013 Acute atopic conjunctivitis, bilateral: Secondary | ICD-10-CM

## 2020-01-31 DIAGNOSIS — T7800XA Anaphylactic reaction due to unspecified food, initial encounter: Secondary | ICD-10-CM

## 2020-01-31 MED ORDER — MONTELUKAST SODIUM 10 MG PO TABS: 10.0000 mg | ORAL_TABLET | Freq: Every day | ORAL | 5 refills | Status: AC

## 2020-01-31 MED ORDER — EPINEPHRINE 0.3 MG/0.3ML IJ SOAJ: 0.3000 mg | Freq: Once | INTRAMUSCULAR | 2 refills | Status: AC

## 2020-01-31 MED ORDER — MOMETASONE FUROATE 50 MCG/ACT NA SUSP: 1.0000 | Freq: Every day | NASAL | 5 refills | Status: AC

## 2020-01-31 NOTE — Progress Notes (Signed)
[place] [facility] [place] [facility] [place] - [place] - [place]   Dear Dr. Netty Starring,  Thank you for referring Devin Martinez to the [facility] on 01/31/2020.   Below is a summation of this patient's evaluation and recommendations.  Thank you for your referral. I will keep you informed about this patient's response to treatment.   If you have any questions please do not hesitate to contact me.   Sincerely,  Jiles Prows, MD Allergy / Immunology [place]   ______________________________________________________________________    NEW PATIENT NOTE  Referring Provider: Dion Body, MD Primary Provider: Dion Body, MD Date of office visit: 01/31/2020    Subjective:   Chief Complaint:  Devin Martinez (DOB: [birth year]) is a 79 y.o. male who presents to the clinic on 01/31/2020 with a chief complaint of Allergic Rhinitis  (and itchy/watery eyes) .     HPI: Pasqual presents to this clinic in evaluation of allergies.  His allergies are manifested for the most part as problems with his eyes.  He has itchy eyes and watery eyes especially in the morning.  He may have a little bit of intermittent sneezing and some intermittent nasal congestion but it is really his eyes that bother him the most.  In the morning he must wake up and rub his eyes and he likes to put a warm compress on his eyes.  Sometimes he uses his warm coffee cup for relief.  He has tried a multitude of other medications including various antihistamines and eyedrops which have not helped him.  This issue has been particularly bad for the past 6 months.  He suspects it might be his new dog who was introduced into the house 2 years ago.  Sometimes when he spends many hours outside of the house he actually does a little better.  His last visit with a ophthalmologist was 6 months ago.  He also has a history of developing a rather  significant reaction to [place] nut.  Greater than 10 years ago he ate a [place] nut and developed diffuse pruritus.  He repeated this experiment 1 time and once again developed diffuse pruritus and he has not eaten any [place] nut in over a decade.  He also has obstruction to swallowing.  This occurs at least 1 time per week and sometimes he must expel the food from his chest.  He had a esophageal dilation about 6 years ago.  He is not using any therapy for reflux.  Apparently he developed some significant myalgia with the use of a proton pump inhibitor administered in the past.  He has severe COPD which he thinks is under very good control at this point in time on multiple controller agents and oxygen at nighttime.  He is followed by a pulmonologist.  He has received 2 Covid vaccinations.  Past Medical History:  Diagnosis Date  . Asthma   . COPD (chronic obstructive pulmonary disease) ([place])   . Degenerative arthritis of left knee   . Diabetes mellitus    Type II  . Herpes   . Hyperlipidemia   . Hypertension   . LVH (left ventricular hypertrophy)    Hx. of  . Obesity   . Smoker     Past Surgical History:  Procedure Laterality Date  . HERNIA REPAIR    . JOINT REPLACEMENT    . LAMINECTOMY      Allergies as of 01/31/2020  Reactions   Enalapril Swelling      Medication List      albuterol 108 (90 Base) MCG/ACT inhaler Commonly known as: VENTOLIN HFA Inhale 1-2 puffs into the lungs every 6 (six) hours as needed for wheezing or shortness of breath.   budesonide-formoterol 160-4.5 MCG/ACT inhaler Commonly known as: SYMBICORT Inhale 2 puffs into the lungs 2 (two) times daily.   hydrochlorothiazide 12.5 MG tablet Commonly known as: HYDRODIURIL Take 12.5 mg by mouth daily.   ipratropium-albuterol 0.5-2.5 (3) MG/3ML Soln Commonly known as: DUONEB Take 3 mLs by nebulization every 6 (six) hours.   losartan 50 MG tablet Commonly known as: COZAAR Take 50 mg by mouth daily.    olopatadine 0.1 % ophthalmic solution Commonly known as: PATANOL Place 1 drop into both eyes daily.   RED YEAST RICE PO Take 1 tablet by mouth daily.   tiotropium 18 MCG inhalation capsule Commonly known as: SPIRIVA Place 18 mcg into inhaler and inhale daily.       Review of systems negative except as noted in HPI / PMHx or noted below:  Review of Systems  Constitutional: Negative.   HENT: Negative.   Eyes: Negative.   Respiratory: Negative.   Cardiovascular: Negative.   Gastrointestinal: Negative.   Genitourinary: Negative.   Musculoskeletal: Negative.   Skin: Negative.   Neurological: Negative.   Endo/Heme/Allergies: Negative.   Psychiatric/Behavioral: Negative.     Family History  Problem Relation Age of Onset  . Heart attack Mother     Social History   Socioeconomic History  . Marital status: Single    Spouse name: Not on file  . Number of children: Not on file  . Years of education: Not on file  . Highest education level: Not on file  Occupational History  . Not on file  Tobacco Use  . Smoking status: Former Smoker    Types: Cigarettes  . Smokeless tobacco: Never Used  Substance and Sexual Activity  . Alcohol use: Yes    Comment: occasionally  . Drug use: No  . Sexual activity: Not on file  Other Topics Concern  . Not on file  Social History Narrative  . Not on file    Environmental and Social history  Lives in a house with a dry environment, a dog located inside the bedroom, carpet in the bedroom, no plastic on the bed, no plastic on the pillow, no smoking ongoing with inside the household.  Objective:   Vitals:   01/31/20 0957  BP: (!) 160/60  Pulse: 69  Resp: 18  Temp: 97.6 F (36.4 C)  SpO2: 93%   Height: 5\' 10"  (177.8 cm) Weight: 206 lb (93.4 kg)  Physical Exam Constitutional:      Appearance: He is not diaphoretic.  HENT:     Head: Normocephalic.     Right Ear: Tympanic membrane, ear canal and external ear normal.      Left Ear: Tympanic membrane, ear canal and external ear normal.     Nose: Nose normal. No mucosal edema or rhinorrhea.     Mouth/Throat:     Pharynx: Uvula midline. No oropharyngeal exudate.  Eyes:     Conjunctiva/sclera: Conjunctivae normal.  Neck:     Thyroid: No thyromegaly.     Trachea: Trachea normal. No tracheal tenderness or tracheal deviation.  Cardiovascular:     Rate and Rhythm: Normal rate and regular rhythm.     Heart sounds: Normal heart sounds, S1 normal and S2 normal. No murmur.  Pulmonary:  Effort: No respiratory distress.     Breath sounds: Normal breath sounds. No stridor. No wheezing or rales.  Lymphadenopathy:     Head:     Right side of head: No tonsillar adenopathy.     Left side of head: No tonsillar adenopathy.     Cervical: No cervical adenopathy.  Skin:    Findings: No erythema or rash.     Nails: There is no clubbing.  Neurological:     Mental Status: He is alert.     Diagnostics: Allergy skin tests were performed.  He demonstrated hypersensitivity to tree pollen.  Spirometry was performed and demonstrated an FEV1 of 0.89 @ 35 % of predicted. FEV1/FVC = 0.43  The patient had an Asthma Control Test with the following results:  .  Results of blood tests obtained 23 May 2018 identified WBC 13.5, absolute eosinophils 0, absolute lymphocyte 800, hemoglobin 13.7, platelet 268.  Results of blood tests obtained 14 December 2019 identified WBC 6.4, eosinophil 28, lymphocyte 1090, hemoglobin 14.3, platelet 238  Results of a chest x-ray obtained 23 May 2018 identified the following:  The cardiomediastinal silhouette is unremarkable. LEFT LOWER lobe posterior basilar opacity may represent airspacedisease or atelectasis. COPD changes again noted with slightly increasing peribronchialthickening. No pulmonary mass, suspicious nodule, pleural effusion orpneumothorax identified.  Results of pulmonary function testing obtained 16 May 2016 identified  DL/VA 75%  Assessment and Plan:    1. Perennial allergic conjunctivitis of both eyes   2. Seasonal allergic conjunctivitis   3. Vernal conjunctivitis of both eyes   4. Perennial allergic rhinitis   5. Seasonal allergic rhinitis due to pollen   6. COPD with hypoxia ([place])   7. Allergy with anaphylaxis due to food     1.  Allergen avoidance measures - pollen  2.  Never ever rub or touch eyes  3.  Treat and prevent inflammation:   A.  Montelukast 10 mg - 1 tablet 1 time per day  B.  Nasonex-1 spray each nostril 1 time per day  4.  Prior to bedtime use Systane gel drops both eyes  5.  Continue Symbicort and Spiriva and DuoNeb and nocturnal oxygen  7.  If needed:   A.  Systane (not gel) drops multiple times a day  B.  EpiPen, Benadryl, MD/ER evaluation for allergic reaction  8.  Return to clinic in 3 weeks or earlier if problem  Farzad has significant inflammation of his conjunctiva which may be based upon atopic disease but we need to consider the possibility of vernal conjunctivitis as well.  We will start therapy with a combination of anti-inflammatory medications for both his airway and his eyes as noted above and will hopefully get him to stop rubbing his eyes while utilizing a lubrication drop on a pretty consistent basis.  His lower airway issue does not need to be addressed beyond encouraging him to consistently use his medications including oxygen therapy for this issue.  He does have a history of possible food allergy directed against [place] nut and we will further evaluate this issue at some point in the near future and possibly arrange an in clinic food challenge.  Jiles Prows, MD Allergy / Immunology [facility]

## 2020-01-31 NOTE — Patient Instructions (Addendum)
  1.  Allergen avoidance measures - pollen  2.  Never ever rub or touch eyes  3.  Treat and prevent inflammation:   A.  Montelukast 10 mg - 1 tablet 1 time per day  B.  Nasonex-1 spray each nostril 1 time per day  4.  Prior to bedtime use Systane gel drops both eyes  5.  Continue Symbicort and Spiriva and DuoNeb and nocturnal oxygen  7.  If needed:   A.  Systane (not gel) drops multiple times a day  B.  EpiPen, Benadryl, MD/ER evaluation for allergic reaction  8.  Return to clinic in 3 weeks or earlier if problem

## 2020-02-01 ENCOUNTER — Encounter: Payer: Self-pay | Admitting: Allergy and Immunology

## 2020-02-01 NOTE — Addendum Note (Signed)
Addended by: Jiles Prows on: 02/01/2020 08:11 AM   Modules accepted: Level of Service

## 2020-02-28 ENCOUNTER — Other Ambulatory Visit: Payer: Self-pay

## 2020-02-28 ENCOUNTER — Encounter: Payer: Self-pay | Admitting: Allergy and Immunology

## 2020-02-28 ENCOUNTER — Ambulatory Visit (INDEPENDENT_AMBULATORY_CARE_PROVIDER_SITE_OTHER): Payer: No Typology Code available for payment source | Admitting: Allergy and Immunology

## 2020-02-28 VITALS — BP 136/82 | HR 69 | Temp 97.6°F | Resp 16

## 2020-02-28 DIAGNOSIS — J3089 Other allergic rhinitis: Secondary | ICD-10-CM

## 2020-02-28 DIAGNOSIS — H1013 Acute atopic conjunctivitis, bilateral: Secondary | ICD-10-CM

## 2020-02-28 DIAGNOSIS — H1045 Other chronic allergic conjunctivitis: Secondary | ICD-10-CM | POA: Diagnosis not present

## 2020-02-28 DIAGNOSIS — H1010 Acute atopic conjunctivitis, unspecified eye: Secondary | ICD-10-CM

## 2020-02-28 DIAGNOSIS — T7800XA Anaphylactic reaction due to unspecified food, initial encounter: Secondary | ICD-10-CM

## 2020-02-28 DIAGNOSIS — J301 Allergic rhinitis due to pollen: Secondary | ICD-10-CM | POA: Diagnosis not present

## 2020-02-28 MED ORDER — LOTEPREDNOL ETABONATE 0.2 % OP SUSP: OPHTHALMIC | 5 refills | Status: AC

## 2020-02-28 MED ORDER — LOTEPREDNOL ETABONATE 0.2 % OP SUSP: OPHTHALMIC | 5 refills | Status: DC

## 2020-02-28 MED ORDER — EPINEPHRINE 0.3 MG/0.3ML IJ SOAJ: 0.3000 mg | Freq: Once | INTRAMUSCULAR | 1 refills | Status: AC

## 2020-02-28 NOTE — Progress Notes (Signed)
[facility]   Follow-up Note  Referring Provider: Dion Body, MD Primary Provider: Dion Body, MD Date of Office Visit: 02/28/2020  Subjective:   Devin Martinez (DOB: 15-Feb-1941) is a 79 y.o. male who returns to the [facility] on 02/28/2020 in re-evaluation of the following:  HPI: Devin Martinez returns to this clinic in evaluation of inflammation of his eye and upper airway and a history of possible food allergy directed against [place] nut in the context of severe COPD requiring nocturnal oxygen supplementation.  His last visit to this clinic was his initial evaluation of 31 January 2020.  He does not think that his eyes are any better.  He still finds them to be extremely watery especially when he wakes up in the morning.  He has tried Systane eyedrops.  He has tried allergen avoidance measures as best as possible against tree pollen.  He has been consistently using montelukast and Nasonex and he does not believe that the montelukast has helped him at all.  His nose is still little bit stuffy on occasion.  His lower airway issue is stable.  He still has wheezing and coughing and shortness of breath and limitation in ability to exercise and must use his bronchodilator 4 times per day while he continues on a combination of Symbicort and Spiriva.  He remains away from consuming [place] nut.  Allergies as of 02/28/2020      Reactions   [place] Nut (berthollefia [place]) Skin Test Itching   Enalapril Swelling      Medication List      albuterol 108 (90 Base) MCG/ACT inhaler Commonly known as: VENTOLIN HFA Inhale 1-2 puffs into the lungs every 6 (six) hours as needed for wheezing or shortness of breath.   budesonide-formoterol 160-4.5 MCG/ACT inhaler Commonly known as: SYMBICORT Inhale 2 puffs into the lungs 2 (two) times daily.   hydrochlorothiazide 12.5 MG tablet Commonly known as: HYDRODIURIL Take 12.5 mg by mouth  daily.   ipratropium-albuterol 0.5-2.5 (3) MG/3ML Soln Commonly known as: DUONEB Take 3 mLs by nebulization every 6 (six) hours.   losartan 50 MG tablet Commonly known as: COZAAR Take 50 mg by mouth daily.   mometasone 50 MCG/ACT nasal spray Commonly known as: Nasonex Place 1 spray into the nose daily. Two sprays each in each nostril   montelukast 10 MG tablet Commonly known as: SINGULAIR Take 1 tablet (10 mg total) by mouth at bedtime.   olopatadine 0.1 % ophthalmic solution Commonly known as: PATANOL Place 1 drop into both eyes daily.   RED YEAST RICE PO Take 1 tablet by mouth daily.   tiotropium 18 MCG inhalation capsule Commonly known as: SPIRIVA Place 18 mcg into inhaler and inhale daily.       Past Medical History:  Diagnosis Date  . Asthma   . COPD (chronic obstructive pulmonary disease) ([place])   . Degenerative arthritis of left knee   . Diabetes mellitus    Type II  . Herpes   . Hyperlipidemia   . Hypertension   . LVH (left ventricular hypertrophy)    Hx. of  . Obesity   . Smoker     Past Surgical History:  Procedure Laterality Date  . HERNIA REPAIR    . JOINT REPLACEMENT    . LAMINECTOMY      Review of systems negative except as noted in HPI / PMHx or noted below:  Review of Systems  Constitutional: Negative.  HENT: Negative.   Eyes: Negative.   Respiratory: Negative.   Cardiovascular: Negative.   Gastrointestinal: Negative.   Genitourinary: Negative.   Musculoskeletal: Negative.   Skin: Negative.   Neurological: Negative.   Endo/Heme/Allergies: Negative.   Psychiatric/Behavioral: Negative.      Objective:   Vitals:   02/28/20 1005  BP: 136/82  Pulse: 69  Resp: 16  Temp: 97.6 F (36.4 C)  SpO2: 94%          Physical Exam Constitutional:      Appearance: He is not diaphoretic.  HENT:     Head: Normocephalic.     Right Ear: Tympanic membrane, ear canal and external ear normal.     Left Ear: Tympanic membrane, ear  canal and external ear normal.     Nose: Nose normal. No mucosal edema or rhinorrhea.     Mouth/Throat:     Pharynx: Uvula midline. No oropharyngeal exudate.  Eyes:     Conjunctiva/sclera: Conjunctivae normal.  Neck:     Thyroid: No thyromegaly.     Trachea: Trachea normal. No tracheal tenderness or tracheal deviation.  Cardiovascular:     Rate and Rhythm: Normal rate and regular rhythm.     Heart sounds: Normal heart sounds, S1 normal and S2 normal. No murmur.  Pulmonary:     Effort: No respiratory distress.     Breath sounds: Normal breath sounds. No stridor. No wheezing or rales.  Lymphadenopathy:     Head:     Right side of head: No tonsillar adenopathy.     Left side of head: No tonsillar adenopathy.     Cervical: No cervical adenopathy.  Skin:    Findings: No erythema or rash.     Nails: There is no clubbing.  Neurological:     Mental Status: He is alert.     Diagnostics: none  Assessment and Plan:   1. Perennial allergic conjunctivitis of both eyes   2. Seasonal allergic conjunctivitis   3. Perennial allergic rhinitis   4. Seasonal allergic rhinitis due to pollen   5. Allergy with anaphylaxis due to food     1.  Allergen avoidance measures - pollen   2.  Never ever rub or touch eyes  3.  Treat and prevent inflammation:   A.  Alrex - 1 drop each eye 1 time per day (sample and prescription)  B.  Nasonex-1 spray each nostril 1 time per day  4.  Prior to bedtime use Systane gel drops both eyes  5.  Continue Symbicort and Spiriva and DuoNeb and nocturnal oxygen  6.  If needed:   A.  Systane (not gel) drops multiple times a day  B.  EpiPen, Benadryl, MD/ER evaluation for allergic reaction  7. Blood - Nut panel w/R, Area 2 Aeroallergen profile  8.  Return to clinic in 8 weeks or earlier if problem  Devin Martinez appears to have continued inflammation of his conjunctiva and we will start him on a very low potency topical steroid using loteprednol 0.2% on a daily  basis.  We will further investigate his allergen sensitivity with the blood tests noted above as well as his history of [place] nut hypersensitivity.  I will contact him with the results of his blood test once they are available for review.  Allena Katz, MD Allergy / Immunology [facility]

## 2020-02-28 NOTE — Patient Instructions (Addendum)
  1.  Allergen avoidance measures - pollen  2.  Never ever rub or touch eyes  3.  Treat and prevent inflammation:   A.  Alrex - 1 drop each eye 1 time per day (sample and prescription)  B.  Nasonex-1 spray each nostril 1 time per day  4.  Prior to bedtime use Systane gel drops both eyes  5.  Continue Symbicort and Spiriva and DuoNeb and nocturnal oxygen  6.  If needed:   A.  Systane (not gel) drops multiple times a day  B.  EpiPen, Benadryl, MD/ER evaluation for allergic reaction  7. Blood - Nut panel w/R, Area 2 Aeroallergen profile  8.  Return to clinic in 8 weeks or earlier if problem

## 2020-02-29 ENCOUNTER — Encounter: Payer: Self-pay | Admitting: Allergy and Immunology

## 2020-02-29 ENCOUNTER — Telehealth: Payer: Self-pay | Admitting: *Deleted

## 2020-02-29 NOTE — Telephone Encounter (Signed)
Devin Martinez from [facility] called stating that the Lotemax 0.2% eye drops are not covered. The preferred alternatives are Prednisolone Acetate 1% or Fluorometholone. Would you be okay with sending in one of the alternatives? If so, they'll need a prescription sent in.  Please advise.

## 2020-03-01 ENCOUNTER — Other Ambulatory Visit: Payer: Self-pay | Admitting: *Deleted

## 2020-03-01 MED ORDER — PREDNISOLONE ACETATE 1 % OP SUSP: OPHTHALMIC | 5 refills | Status: AC

## 2020-03-01 NOTE — Telephone Encounter (Signed)
Dr. Kozlow, Please advise.  

## 2020-03-01 NOTE — Telephone Encounter (Signed)
New prescription has been sent in to the [facility] in [facility] patient and advised of change in eye drop and directions for use, also asked for the patient to call us back in 2 weeks with an update. Patient verbalized understanding.

## 2020-03-01 NOTE — Telephone Encounter (Signed)
Please inform patient that he can use Prednisolone Acetate 1% but at a much lower dose.  He can use 1 drop each eye every other day but not every day.  Have him contact us in 2 weeks regarding her response to this approach.

## 2020-03-03 LAB — IGE NUT PROF. W/COMPONENT RFLX
F017-IgE Hazelnut (Filbert): <0.1 kU/L
F018-IgE Brazil Nut: <0.1 kU/L
F020-IgE Almond: <0.1 kU/L
F202-IgE Cashew Nut: <0.1 kU/L
F203-IgE Pistachio Nut: <0.1 kU/L
F256-IgE Walnut: <0.1 kU/L
Macadamia Nut, IgE: <0.1 kU/L
Peanut, IgE: <0.1 kU/L
Pecan Nut IgE: <0.1 kU/L

## 2020-03-03 LAB — ALLERGENS W/TOTAL IGE AREA 2
Alternaria Alternata IgE: <0.1 kU/L
Aspergillus Fumigatus IgE: <0.1 kU/L
Bermuda Grass IgE: <0.1 kU/L
Cat Dander IgE: <0.1 kU/L
Cedar, Mountain IgE: <0.1 kU/L
Cladosporium Herbarum IgE: <0.1 kU/L
Cockroach, German IgE: <0.1 kU/L
Common Silver Birch IgE: <0.1 kU/L
Cottonwood IgE: <0.1 kU/L
D Farinae IgE: <0.1 kU/L
D Pteronyssinus IgE: <0.1 kU/L
Dog Dander IgE: <0.1 kU/L
Elm, American IgE: <0.1 kU/L
IgE (Immunoglobulin E), Serum: 75 IU/mL (ref 6–495)
Johnson Grass IgE: <0.1 kU/L
Maple/Box Elder IgE: <0.1 kU/L
Mouse Urine IgE: <0.1 kU/L
Oak, White IgE: 0.1 kU/L — AB
Pecan, Hickory IgE: <0.1 kU/L
Penicillium Chrysogen IgE: <0.1 kU/L
Pigweed, Rough IgE: <0.1 kU/L
Ragweed, Short IgE: <0.1 kU/L
Sheep Sorrel IgE Qn: <0.1 kU/L
Timothy Grass IgE: <0.1 kU/L
White Mulberry IgE: <0.1 kU/L

## 2020-03-05 ENCOUNTER — Telehealth: Payer: Self-pay | Admitting: Allergy and Immunology

## 2020-03-05 NOTE — Telephone Encounter (Signed)
Patient called and was reviewing labs on mychart and had some questions. Patient was informed that Dr. Neldon Mc had reviewed the labs and a nurse would give him a call to go over the labs with him. Patient states he will be running errands today, but we could leave a message on his phone and he would call back.  Please advise.

## 2020-03-05 NOTE — Telephone Encounter (Signed)
Inform patient of lab results. He verbalized understanding and would follow up with Dr. Neldon Mc.

## 2020-03-08 ENCOUNTER — Telehealth: Payer: Self-pay | Admitting: Pulmonary Disease

## 2020-03-08 NOTE — Telephone Encounter (Signed)
I called Devin Martinez to verify the msg  She is just needing the last ov note faxed to her  Pt aware this is being send bc per Devin Martinez he has already called to check status of this  I have faxed the notes  Pt aware  Nothing further needed

## 2020-03-16 ENCOUNTER — Telehealth: Payer: Self-pay

## 2020-03-16 NOTE — Telephone Encounter (Signed)
Patient called stating he would like a referral to see an Opthalmologic for his itchy watery eyes. Patient was seen by Dr. Neldon Mc on 01/31/2020 for allergy testing and was told he was allergic to [facility] mix. Patient states he has constantly had eye issues since December of 2020 and seems to be getting worse. Patient is taking Alrex 1 drop daily and taking his antihistamine. He would like to see an provider in [place]. Please advise.

## 2020-03-19 DIAGNOSIS — M180 Bilateral primary osteoarthritis of first carpometacarpal joints: Secondary | ICD-10-CM | POA: Diagnosis not present

## 2020-03-19 NOTE — Telephone Encounter (Signed)
Can we please do a referral to Ophthalmology in [facility] for Perennial allergic conjunctivitis of both eyes please?

## 2020-03-19 NOTE — Telephone Encounter (Signed)
Please have patient make an appointment with an ophthalmologist in [place].  Please assist him with this endeavor.  I do not know any ophthalmologist in [place].

## 2020-03-22 NOTE — Telephone Encounter (Signed)
I will call and speak with the Patients PCP down at the [facility] to see if they can get a referral going to an Ophthalmologist. I spoke with the patient and he would like to go through the [place] also so they will cover his visit.   Patient states he called them last week but hasn't heard back regarding the referral.   Thanks

## 2020-03-22 NOTE — Telephone Encounter (Signed)
[facility] received [facility] message & will contact him to schedule an appointment with the eye clinic at the [facility]. Patient informed.  Thanks

## 2020-04-19 DIAGNOSIS — Z1321 Encounter for screening for nutritional disorder: Secondary | ICD-10-CM | POA: Diagnosis not present

## 2020-04-19 DIAGNOSIS — E7800 Pure hypercholesterolemia, unspecified: Secondary | ICD-10-CM | POA: Diagnosis not present

## 2020-04-24 ENCOUNTER — Ambulatory Visit: Payer: PPO | Admitting: Allergy and Immunology

## 2020-04-26 DIAGNOSIS — Z0000 Encounter for general adult medical examination without abnormal findings: Secondary | ICD-10-CM | POA: Diagnosis not present

## 2020-04-26 DIAGNOSIS — I10 Essential (primary) hypertension: Secondary | ICD-10-CM | POA: Diagnosis not present

## 2020-04-26 DIAGNOSIS — E7800 Pure hypercholesterolemia, unspecified: Secondary | ICD-10-CM | POA: Diagnosis not present

## 2020-04-26 DIAGNOSIS — J449 Chronic obstructive pulmonary disease, unspecified: Secondary | ICD-10-CM | POA: Diagnosis not present

## 2020-07-09 ENCOUNTER — Ambulatory Visit: Payer: PPO | Attending: Internal Medicine

## 2020-07-09 ENCOUNTER — Ambulatory Visit: Payer: Self-pay

## 2020-07-09 DIAGNOSIS — Z23 Encounter for immunization: Secondary | ICD-10-CM

## 2020-07-09 NOTE — Progress Notes (Signed)
   Covid-19 Vaccination Clinic  Name:  Devin Martinez    MRN: [id number] DOB: 1940/11/18  07/09/2020  Devin Martinez was observed post Covid-19 immunization for 15 minutes without incident. He was provided with Vaccine Information Sheet and instruction to access the V-Safe system.   Devin Martinez was instructed to call 911 with any severe reactions post vaccine: Marland Kitchen Difficulty breathing  . Swelling of face and throat  . A fast heartbeat  . A bad rash all over body  . Dizziness and weakness

## 2020-08-21 ENCOUNTER — Encounter: Payer: Self-pay | Admitting: General Practice

## 2020-09-04 ENCOUNTER — Telehealth: Payer: Self-pay | Admitting: Pulmonary Disease

## 2020-09-05 NOTE — Telephone Encounter (Deleted)
Lm for patient.   Which fax is he referring to?

## 2020-09-05 NOTE — Telephone Encounter (Signed)
Spoke to patient, who is wanting to know if [facility] approved for his visit on 09/10/2020. He would like a call back with an update. He is okay with rescheduling visit if needed.   Patrice, please advise. Thanks

## 2020-09-05 NOTE — Telephone Encounter (Signed)
Patient is returning phone call. Patient phone number is [phone].

## 2020-09-08 NOTE — Telephone Encounter (Signed)
I have not received any authorization for this patient. Looks like [facility] faxed request for additional authorization. Patient can also call the [facility] to see if they can expedite this. I would r/s appt until authorization has been received -pr

## 2020-09-10 ENCOUNTER — Ambulatory Visit: Payer: PPO | Admitting: Pulmonary Disease

## 2020-09-10 NOTE — Telephone Encounter (Signed)
Pt would like to r/s appt until we receive auth from [place].  Refaxed referral to [place]. Re'd fax confirm. Nothing needed at this time.

## 2020-10-10 ENCOUNTER — Ambulatory Visit (INDEPENDENT_AMBULATORY_CARE_PROVIDER_SITE_OTHER): Payer: No Typology Code available for payment source | Admitting: Dermatology

## 2020-10-10 ENCOUNTER — Other Ambulatory Visit: Payer: Self-pay

## 2020-10-10 DIAGNOSIS — D492 Neoplasm of unspecified behavior of bone, soft tissue, and skin: Secondary | ICD-10-CM

## 2020-10-10 DIAGNOSIS — L570 Actinic keratosis: Secondary | ICD-10-CM | POA: Diagnosis not present

## 2020-10-10 DIAGNOSIS — L719 Rosacea, unspecified: Secondary | ICD-10-CM

## 2020-10-10 DIAGNOSIS — L820 Inflamed seborrheic keratosis: Secondary | ICD-10-CM

## 2020-10-10 DIAGNOSIS — D485 Neoplasm of uncertain behavior of skin: Secondary | ICD-10-CM

## 2020-10-10 DIAGNOSIS — D3617 Benign neoplasm of peripheral nerves and autonomic nervous system of trunk, unspecified: Secondary | ICD-10-CM | POA: Diagnosis not present

## 2020-10-10 DIAGNOSIS — L578 Other skin changes due to chronic exposure to nonionizing radiation: Secondary | ICD-10-CM | POA: Diagnosis not present

## 2020-10-10 MED ORDER — METRONIDAZOLE 0.75 % EX GEL: 1.0000 "application " | Freq: Every day | CUTANEOUS | 6 refills | Status: DC

## 2020-10-10 NOTE — Progress Notes (Signed)
Follow-Up Visit   Subjective  Devin Martinez is a 79 y.o. male who presents for the following: check scaly spots (Face, 42m) and Nevus (Check moles on back).  The following portions of the chart were reviewed this encounter and updated as appropriate:   Tobacco  Allergies  Meds  Problems  Med Hx  Surg Hx  Fam Hx     Review of Systems:  No other skin or systemic complaints except as noted in HPI or Assessment and Plan.  Objective  Well appearing patient in no apparent distress; mood and affect are within normal limits.  A focused examination was performed including face, back. Relevant physical exam findings are noted in the Assessment and Plan.  Objective  bil ears x 2 (2): Pink scaly macules   Objective  bil temples x 6 (6): Erythematous keratotic or waxy stuck-on papule or plaque.   Objective  Nose: Pink pap nose  Objective  upper back spinal: 1.2cm pink fleshy pap   Assessment & Plan  AK (actinic keratosis) (2) bil ears x 2  Destruction of lesion - bil ears x 2 Complexity: simple   Destruction method: cryotherapy   Informed consent: discussed and consent obtained   Timeout:  patient name, date of birth, surgical site, and procedure verified Lesion destroyed using liquid nitrogen: Yes   Region frozen until ice ball extended beyond lesion: Yes   Outcome: patient tolerated procedure well with no complications   Post-procedure details: wound care instructions given    Inflamed seborrheic keratosis (6) bil temples x 6  Destruction of lesion - bil temples x 6 Complexity: simple   Destruction method: cryotherapy   Informed consent: discussed and consent obtained   Timeout:  patient name, date of birth, surgical site, and procedure verified Lesion destroyed using liquid nitrogen: Yes   Region frozen until ice ball extended beyond lesion: Yes   Outcome: patient tolerated procedure well with no complications   Post-procedure details: wound care instructions  given    Rosacea Nose Chronic Start Metrogel 0.75% qhs Rosacea is a chronic progressive skin condition usually affecting the face of adults, causing redness and/or acne bumps. It is treatable but not curable. It sometimes affects the eyes (ocular rosacea) as well. It may respond to topical and/or systemic medication and can flare with stress, sun exposure, alcohol, exercise and some foods.  Daily application of broad spectrum spf 30+ sunscreen to face is recommended to reduce flares.  Other Related Medications metroNIDAZOLE (METROGEL) 0.75 % gel  Neoplasm of skin upper back spinal  Epidermal / dermal shaving  Lesion diameter (cm):  1.2 Informed consent: discussed and consent obtained   Timeout: patient name, date of birth, surgical site, and procedure verified   Procedure prep:  Patient was prepped and draped in usual sterile fashion Prep type:  Isopropyl alcohol Anesthesia: the lesion was anesthetized in a standard fashion   Anesthetic:  1% lidocaine w/ epinephrine 1-100,000 buffered w/ 8.4% NaHCO3 Instrument used: flexible razor blade   Hemostasis achieved with: pressure, aluminum chloride and electrodesiccation   Outcome: patient tolerated procedure well   Post-procedure details: sterile dressing applied and wound care instructions given   Dressing type: bandage and petrolatum    Specimen 1 - Surgical pathology Differential Diagnosis: Neurofibroma vs other r/o CA  Check Margins: yes 1.2cm pink fleshy pap  Actinic Damage - chronic, secondary to cumulative UV radiation exposure/sun exposure over time - diffuse scaly erythematous macules with underlying dyspigmentation - Recommend daily broad spectrum sunscreen  SPF 30+ to sun-exposed areas, reapply every 2 hours as needed.  - Call for new or changing lesions.  Return in about 6 months (around 04/10/2021) for TBSE, Hx of AKs, Rosacea.   I, Othelia Pulling, RMA, am acting as scribe for Sarina Ser, MD .  Documentation: I  have reviewed the above documentation for accuracy and completeness, and I agree with the above.  Sarina Ser, MD

## 2020-10-10 NOTE — Patient Instructions (Signed)

## 2020-10-11 ENCOUNTER — Other Ambulatory Visit: Payer: Self-pay

## 2020-10-11 DIAGNOSIS — L719 Rosacea, unspecified: Secondary | ICD-10-CM

## 2020-10-11 MED ORDER — METRONIDAZOLE 0.75 % EX GEL: 1.0000 "application " | Freq: Every day | CUTANEOUS | 6 refills | Status: AC

## 2020-10-11 NOTE — Progress Notes (Signed)
Pharmacy change

## 2020-10-15 ENCOUNTER — Telehealth: Payer: Self-pay

## 2020-10-15 NOTE — Telephone Encounter (Signed)
-----   Message from Ralene Bathe, MD sent at 10/11/2020  4:50 PM EST ----- Diagnosis Skin , upper back spinal NEUROFIBROMA,  Benign Neurofibroma May recur Recheck next visit

## 2020-10-15 NOTE — Telephone Encounter (Signed)
Advised patient of results/hd  

## 2020-10-16 ENCOUNTER — Ambulatory Visit (INDEPENDENT_AMBULATORY_CARE_PROVIDER_SITE_OTHER): Payer: No Typology Code available for payment source | Admitting: Gastroenterology

## 2020-10-16 ENCOUNTER — Encounter: Payer: Self-pay | Admitting: Dermatology

## 2020-10-16 ENCOUNTER — Other Ambulatory Visit: Payer: Self-pay

## 2020-10-16 DIAGNOSIS — R1310 Dysphagia, unspecified: Secondary | ICD-10-CM

## 2020-10-16 NOTE — Progress Notes (Signed)
Devin Bellows MD, MRCP(U.K) [address]  [place], [place] [address]  Main: [phone]  Fax: [phone]   Gastroenterology Consultation  Referring Provider:     Dion Body, MD Primary Care Physician:  Dion Body, MD Primary Gastroenterologist:  Dr. Jonathon Martinez  Reason for Consultation:     Dysphagia        HPI:   Devin Martinez is a 79 y.o. y/o male referred for consultation & management  by Dr. Dion Body, MD.    He states that she has had difficulty in swallowing for over 40 years.  A few years back he had an endoscopy and dilation which helped significantly.  Over the last few years gradually has had worsening dysphagia for solids more than liquids.  Pills occasionally gets stuck.  Some allergies to [place] nuts.  Denies any heartburn.  He has COPD.  Can walk up to his car without any problem.  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  Denies any weight loss.  Past Medical History:  Diagnosis Date  . Asthma   . COPD (chronic obstructive pulmonary disease) ([facility])   . Degenerative arthritis of left knee   . Diabetes mellitus    Type II  . Herpes   . Hyperlipidemia   . Hypertension   . LVH (left ventricular hypertrophy)    Hx. of  . Obesity   . Smoker     Past Surgical History:  Procedure Laterality Date  . HERNIA REPAIR    . JOINT REPLACEMENT    . LAMINECTOMY      Prior to Admission medications   Medication Sig Start Date End Date Taking? Authorizing Provider  albuterol (PROVENTIL HFA;VENTOLIN HFA) 108 (90 Base) MCG/ACT inhaler Inhale 1-2 puffs into the lungs every 6 (six) hours as needed for wheezing or shortness of breath.    [provider]  budesonide-formoterol (SYMBICORT) 160-4.5 MCG/ACT inhaler Inhale 2 puffs into the lungs 2 (two) times daily.    [provider]  hydrochlorothiazide (HYDRODIURIL) 12.5 MG tablet Take 12.5 mg by mouth daily. 05/19/18   [provider]  ipratropium-albuterol (DUONEB) 0.5-2.5 (3) MG/3ML SOLN Take 3 mLs by nebulization every 6 (six) hours. 03/23/16   Epifanio Lesches, MD  losartan (COZAAR) 50 MG tablet Take 50 mg by mouth daily. 11/28/19   [provider]  loteprednol (LOTEMAX) 0.2 % SUSP 1 drop each eye one time per day 02/28/20   Jiles Prows, MD  metroNIDAZOLE (METROGEL) 0.75 % gel Apply 1 application topically at bedtime. 10/11/20 10/11/21  Brendolyn Patty, MD  mometasone (NASONEX) 50 MCG/ACT nasal spray Place 1 spray into the nose daily. Two sprays each in each nostril 01/31/20   Kozlow, Donnamarie Poag, MD  montelukast (SINGULAIR) 10 MG tablet Take 1 tablet (10 mg total) by mouth at bedtime. Patient not taking: Reported on 02/28/2020 01/31/20   Jiles Prows, MD  olopatadine (PATANOL) 0.1 % ophthalmic solution Place 1 drop into both eyes daily. 07/14/19   [provider]  prednisoLONE acetate (PRED FORTE) 1 % ophthalmic suspension 1 drop each eye every other day. 03/01/20   Kozlow, Donnamarie Poag, MD  Red Yeast Rice Extract (RED YEAST RICE PO) Take 1 tablet by mouth daily.      [provider]  tiotropium (SPIRIVA) 18 MCG inhalation capsule Place 18 mcg into inhaler and inhale daily.    [provider]    Family History  Problem Relation  Age of Onset  . Heart attack Mother      Social History   Tobacco Use  . Smoking status: Former Smoker    Types: Cigarettes  . Smokeless tobacco: Never Used  Vaping Use  . Vaping Use: Never used  Substance Use Topics  . Alcohol use: Yes    Comment: occasionally  . Drug use: No    Allergies as of 10/16/2020 - Review Complete 10/10/2020  Allergen Reaction Noted  . [place] nut (berthollefia excelsa) skin test Itching 12/22/2019  . Enalapril Swelling 05/23/2018    Review of Systems:    All systems reviewed and negative except where noted in HPI.   Physical Exam:  There were no vitals taken for this visit. No LMP for male patient. Psych:  Alert and  cooperative. Normal mood and affect. General:   Alert,  Well-developed, well-nourished, pleasant and cooperative in NAD Head:  Normocephalic and atraumatic. Eyes:  Sclera clear, no icterus.   Conjunctiva pink. Lungs:  Respirations even and unlabored.  Clear throughout to auscultation.   No wheezes, crackles, or rhonchi. No acute distress. Heart:  Regular rate and rhythm; no murmurs, clicks, rubs, or gallops. Abdomen:  Normal bowel sounds.  No bruits.  Soft, non-tender and non-distended without masses, hepatosplenomegaly or hernias noted.  No guarding or rebound tenderness.    Neurologic:  Alert and oriented x3;  grossly normal neurologically. Psych:  Alert and cooperative. Normal mood and affect.  Imaging Studies: No results found.  Assessment and Plan:   [name] is a 79 y.o. y/o male has been referred for dysphagia.  Longstanding.  Very likely has Schatzki's ring or peptic stricture.  Plan 1.  EGD with possible dilation and rule out eosinophilic esophagitis  I have discussed alternative options, risks & benefits,  which include, but are not limited to, bleeding, infection, perforation,respiratory complication & drug reaction.  The patient agrees with this plan & written consent will be obtained.       Follow up in 3 months telephone visit  Dr Devin Bellows MD,MRCP(U.K)

## 2020-11-05 ENCOUNTER — Telehealth: Payer: Self-pay | Admitting: Pulmonary Disease

## 2020-11-05 ENCOUNTER — Telehealth: Payer: Self-pay | Admitting: Gastroenterology

## 2020-11-05 NOTE — Telephone Encounter (Signed)
Pt states "his breath has really gone downhill and needs something.Very desparate" He has been researching, and Sildenafil, found 12% and 24% increase in lung capacity. Pt states he has several friends that are on it and their lungs are doing great. Wanting to start this medication ASAP. Please advise [phone]

## 2020-11-05 NOTE — Telephone Encounter (Signed)
Patient wants to ask if Dr Vicente Males would consider calling him in Sildenafil to help his breathing and swallowing, please advise

## 2020-11-05 NOTE — Telephone Encounter (Signed)
Former Dr. Alva Garnet patient, last seen 06/28/2019. Pending OV with Dr. Patsey Berthold for 11/29/2020  Called and spoke to patient, who reports of increased sob and weakness.  Wears 2L with exertion and QHS.  He stated that his breathing has declined over the years and he is in desperate need of relief.  He stated that he has did some research and found that Sildenafil can increase lung capacity by 12%-24%. He would like to start Sildenafil ASAP.   Dr. Patsey Berthold, please advise. Thanks

## 2020-11-05 NOTE — Telephone Encounter (Signed)
Patient has not been seen since 2020, been evaluated by me.  Voiced my recommendations.  I understand that he has done "his research" however this does not put the medication in the proper context and cause himself harm.  As such he is acting AGAINST MEDICAL ADVICE.  Getting the medication without prescription he will need to find another pulmonary specialist to care for his issues.  We will not be able to provide him care under the circumstances.

## 2020-11-05 NOTE — Telephone Encounter (Signed)
Spoke to patient and relayed below message/recommendations.  He stated that he was disappointed and that he will go buy Sildenafil on "black market". Patient declined appt with NP. He would like to discuss this with Dr. Patsey Berthold at his upcoming appt 11/30/2019.  Routing back to [employer] as an [place].

## 2020-11-05 NOTE — Telephone Encounter (Signed)
Without evaluating the patient and doing the appropriate work-up sildenafil can actually make his situation WORSE.  It appears that this is likely related to decline of his very severe COPD.  Would recommend visit with one of our nurse practitioners for reevaluation and keep appointment with me as scheduled.

## 2020-11-05 NOTE — Telephone Encounter (Signed)
Will hold to discuss with nurse supervisor

## 2020-11-06 ENCOUNTER — Telehealth: Payer: Self-pay

## 2020-11-06 DIAGNOSIS — J449 Chronic obstructive pulmonary disease, unspecified: Secondary | ICD-10-CM | POA: Diagnosis not present

## 2020-11-06 DIAGNOSIS — N528 Other male erectile dysfunction: Secondary | ICD-10-CM | POA: Diagnosis not present

## 2020-11-06 NOTE — Telephone Encounter (Signed)
We do not order this medication for swallowing and for the breathing usually ordered by Pulmonologist or Cardiologist if indicated   Dr [name] MD,MRCP [facility]) Gastroenterology/Hepatology Pager: [phone]

## 2020-11-06 NOTE — Telephone Encounter (Signed)
Informed patient of Dr. Vicente Males recommendations. Pt states he would like to cancel his procedure at this time. Will call back to reschedule. Pt verbalized understanding.

## 2020-11-06 NOTE — Telephone Encounter (Signed)
Created in error

## 2020-11-07 NOTE — Telephone Encounter (Signed)
Dismissal letter has been written and placed in Dr. Domingo Dimes look at folder for signature.

## 2020-11-09 NOTE — Telephone Encounter (Signed)
Dismissal letter and patient dismissal activation form has been faxed to medical records.

## 2020-11-12 ENCOUNTER — Ambulatory Visit: Admit: 2020-11-12 | Payer: PPO | Admitting: Gastroenterology

## 2020-11-12 SURGERY — ESOPHAGOGASTRODUODENOSCOPY (EGD) WITH PROPOFOL
Anesthesia: General

## 2020-11-20 ENCOUNTER — Telehealth: Payer: Self-pay | Admitting: Pulmonary Disease

## 2020-11-20 NOTE — Telephone Encounter (Signed)
Patient dismissed from [facility] by Vernard Gambles, MD, effective 11/07/20. Dismissal Letter sent out by 1st class mail. [employer]

## 2020-11-27 ENCOUNTER — Other Ambulatory Visit: Payer: Self-pay

## 2020-11-27 ENCOUNTER — Telehealth: Payer: Self-pay

## 2020-11-27 DIAGNOSIS — R1310 Dysphagia, unspecified: Secondary | ICD-10-CM

## 2020-11-27 NOTE — Telephone Encounter (Signed)
Patient has requested call back to reschedule his EGD originally scheduled in January.    Reschedule Note:  Procedure: EGD Dx: Dysphagia, unspecified R13.10 [facility] Dr. Vicente Males 11/12/20

## 2020-11-29 ENCOUNTER — Ambulatory Visit: Payer: PPO | Admitting: Pulmonary Disease

## 2020-12-06 ENCOUNTER — Other Ambulatory Visit: Payer: Self-pay

## 2020-12-06 ENCOUNTER — Telehealth: Payer: Self-pay | Admitting: Gastroenterology

## 2020-12-06 ENCOUNTER — Other Ambulatory Visit
Admission: RE | Admit: 2020-12-06 | Discharge: 2020-12-06 | Disposition: A | Discharge status: Home / Self Care | Payer: No Typology Code available for payment source | Source: Ambulatory Visit | Attending: Gastroenterology | Admitting: Gastroenterology

## 2020-12-06 ENCOUNTER — Telehealth: Payer: Self-pay | Admitting: Allergy and Immunology

## 2020-12-06 DIAGNOSIS — Z20822 Contact with and (suspected) exposure to covid-19: Secondary | ICD-10-CM | POA: Insufficient documentation

## 2020-12-06 DIAGNOSIS — Z01812 Encounter for preprocedural laboratory examination: Secondary | ICD-10-CM | POA: Diagnosis present

## 2020-12-06 LAB — SARS CORONAVIRUS 2 (TAT 6-24 HRS): SARS Coronavirus 2: NEGATIVE

## 2020-12-06 NOTE — Telephone Encounter (Signed)
Request for continuation of service faxed to the [facility], [phone].

## 2020-12-06 NOTE — Telephone Encounter (Signed)
Please call Renee with [facility] pre service [phone] ext [phone] regarding procedure that is scheduled

## 2020-12-07 NOTE — Telephone Encounter (Signed)
Returned call to [place] at pre service center. LVM to call office back.

## 2020-12-07 NOTE — Telephone Encounter (Signed)
Renee returned my call. We were able to get the procedure code changed at the Endo Unit. She will inform pt today.

## 2020-12-10 ENCOUNTER — Ambulatory Visit: Payer: No Typology Code available for payment source | Admitting: Certified Registered Nurse Anesthetist

## 2020-12-10 ENCOUNTER — Ambulatory Visit
Admission: RE | Admit: 2020-12-10 | Discharge: 2020-12-10 | Disposition: A | Discharge status: Home / Self Care | Payer: No Typology Code available for payment source | Attending: Gastroenterology | Admitting: Gastroenterology

## 2020-12-10 ENCOUNTER — Encounter
Admission: RE | Disposition: A | Discharge status: Home / Self Care | Payer: Self-pay | Source: Home / Self Care | Attending: Gastroenterology

## 2020-12-10 ENCOUNTER — Encounter: Payer: Self-pay | Admitting: Gastroenterology

## 2020-12-10 DIAGNOSIS — Z87891 Personal history of nicotine dependence: Secondary | ICD-10-CM | POA: Diagnosis not present

## 2020-12-10 DIAGNOSIS — D721 Eosinophilia, unspecified: Secondary | ICD-10-CM | POA: Diagnosis not present

## 2020-12-10 DIAGNOSIS — K222 Esophageal obstruction: Secondary | ICD-10-CM | POA: Insufficient documentation

## 2020-12-10 DIAGNOSIS — K209 Esophagitis, unspecified without bleeding: Secondary | ICD-10-CM | POA: Diagnosis not present

## 2020-12-10 DIAGNOSIS — R1310 Dysphagia, unspecified: Secondary | ICD-10-CM | POA: Diagnosis not present

## 2020-12-10 DIAGNOSIS — Z888 Allergy status to other drugs, medicaments and biological substances status: Secondary | ICD-10-CM | POA: Diagnosis not present

## 2020-12-10 DIAGNOSIS — K200 Eosinophilic esophagitis: Secondary | ICD-10-CM | POA: Diagnosis not present

## 2020-12-10 DIAGNOSIS — Z7951 Long term (current) use of inhaled steroids: Secondary | ICD-10-CM | POA: Insufficient documentation

## 2020-12-10 DIAGNOSIS — K449 Diaphragmatic hernia without obstruction or gangrene: Secondary | ICD-10-CM | POA: Diagnosis not present

## 2020-12-10 HISTORY — PX: ESOPHAGOGASTRODUODENOSCOPY: SHX5428

## 2020-12-10 SURGERY — EGD (ESOPHAGOGASTRODUODENOSCOPY)
Anesthesia: General

## 2020-12-10 MED ORDER — SODIUM CHLORIDE 0.9 % IV SOLN
INTRAVENOUS | Status: DC
  Administered 2020-12-10: 1000 mL via INTRAVENOUS

## 2020-12-10 MED ORDER — PROPOFOL 500 MG/50ML IV EMUL
INTRAVENOUS | Status: DC | PRN
  Administered 2020-12-10: 150 ug/kg/min via INTRAVENOUS

## 2020-12-10 MED ORDER — LIDOCAINE HCL (PF) 2 % IJ SOLN
INTRAMUSCULAR | Status: AC
  Filled 2020-12-10: qty 5

## 2020-12-10 MED ORDER — PROPOFOL 10 MG/ML IV BOLUS
INTRAVENOUS | Status: DC | PRN
  Administered 2020-12-10: 60 mg via INTRAVENOUS

## 2020-12-10 MED ORDER — PROPOFOL 500 MG/50ML IV EMUL
INTRAVENOUS | Status: AC
  Filled 2020-12-10: qty 50

## 2020-12-10 MED ORDER — LIDOCAINE HCL (CARDIAC) PF 100 MG/5ML IV SOSY
PREFILLED_SYRINGE | INTRAVENOUS | Status: DC | PRN
  Administered 2020-12-10: 50 mg via INTRAVENOUS

## 2020-12-10 MED ORDER — OMEPRAZOLE 20 MG PO CPDR: 40.0000 mg | DELAYED_RELEASE_CAPSULE | Freq: Every day | ORAL | 3 refills | Status: DC

## 2020-12-10 NOTE — Anesthesia Preprocedure Evaluation (Signed)
Anesthesia Evaluation  Patient identified by MRN, date of birth, ID band Patient awake    Reviewed: Allergy & Precautions, H&P , NPO status , Patient's Chart, lab work & pertinent test results, reviewed documented beta blocker date and time   History of Anesthesia Complications Negative for: history of anesthetic complications  Airway Mallampati: III  TM Distance: >3 FB Neck ROM: full    Dental  (+) Dental Advidsory Given, Upper Dentures, Lower Dentures   Pulmonary neg shortness of breath, asthma , COPD,  COPD inhaler, neg recent URI, former smoker,    Pulmonary exam normal breath sounds clear to auscultation       Cardiovascular Exercise Tolerance: Good hypertension, (-) angina(-) Past MI and (-) Cardiac Stents Normal cardiovascular exam(-) dysrhythmias (-) Valvular Problems/Murmurs Rhythm:regular Rate:Normal     Neuro/Psych negative neurological ROS  negative psych ROS   GI/Hepatic Neg liver ROS, GERD  ,  Endo/Other  diabetes (borderline)  Renal/GU negative Renal ROS  negative genitourinary   Musculoskeletal   Abdominal   Peds  Hematology negative hematology ROS (+)   Anesthesia Other Findings Past Medical History: No date: Asthma No date: COPD (chronic obstructive pulmonary disease) ([facility]) No date: Degenerative arthritis of left knee No date: Diabetes mellitus     Comment:  Type II No date: Herpes No date: Hyperlipidemia No date: Hypertension No date: LVH (left ventricular hypertrophy)     Comment:  Hx. of No date: Obesity No date: Smoker   Reproductive/Obstetrics negative OB ROS                             Anesthesia Physical Anesthesia Plan  ASA: II  Anesthesia Plan: General   Post-op Pain Management:    Induction: Intravenous  PONV Risk Score and Plan: 2 and TIVA and Propofol infusion  Airway Management Planned: Natural Airway and Nasal Cannula  Additional  Equipment:   Intra-op Plan:   Post-operative Plan:   Informed Consent: I have reviewed the patients History and Physical, chart, labs and discussed the procedure including the risks, benefits and alternatives for the proposed anesthesia with the patient or authorized representative who has indicated his/her understanding and acceptance.     Dental Advisory Given  Plan Discussed with: Anesthesiologist, CRNA and Surgeon  Anesthesia Plan Comments:         Anesthesia Quick Evaluation

## 2020-12-10 NOTE — Anesthesia Postprocedure Evaluation (Signed)
Anesthesia Post Note  Patient: Devin Martinez  Procedure(s) Performed: ESOPHAGOGASTRODUODENOSCOPY (EGD) (N/A )  Patient location during evaluation: Endoscopy Anesthesia Type: General Level of consciousness: awake and alert Pain management: pain level controlled Vital Signs Assessment: post-procedure vital signs reviewed and stable Respiratory status: spontaneous breathing, nonlabored ventilation, respiratory function stable and patient connected to nasal cannula oxygen Cardiovascular status: blood pressure returned to baseline and stable Postop Assessment: no apparent nausea or vomiting Anesthetic complications: no   No complications documented.   Last Vitals:  Vitals:   12/10/20 0835 12/10/20 0845  BP: (!) 116/57 (!) 133/56  Pulse: 65   Resp: 19   Temp:    SpO2: 96%     Last Pain:  Vitals:   12/10/20 0845  TempSrc:   PainSc: 0-No pain                 Martha Clan

## 2020-12-10 NOTE — Op Note (Signed)
[facility] Gastroenterology Patient Name: Devin Martinez Procedure Date: 12/10/2020 7:12 AM MRN: [id number] Account #: [id number] Date of Birth: 02-24-1941 Admit Type: Outpatient Age: 80 Room: [facility] ENDO ROOM 4 Gender: Male Note Status: Finalized Procedure:             Upper GI endoscopy Indications:           Dysphagia Providers:             [name] MD, MD Referring MD:          Dion Body (Referring MD) Medicines:             Monitored Anesthesia Care Complications:         No immediate complications. Procedure:             Pre-Anesthesia Assessment:                        - Prior to the procedure, a History and Physical was                         performed, and patient medications, allergies and                         sensitivities were reviewed. The patient's tolerance                         of previous anesthesia was reviewed.                        - The risks and benefits of the procedure and the                         sedation options and risks were discussed with the                         patient. All questions were answered and informed                         consent was obtained.                        - ASA Grade Assessment: II - A patient with mild                         systemic disease.                        After obtaining informed consent, the endoscope was                         passed under direct vision. Throughout the procedure,                         the patient's blood pressure, pulse, and oxygen                         saturations were monitored continuously. The Endoscope                         was introduced through the mouth, and advanced to the  third part of duodenum. The upper GI endoscopy was                         accomplished with ease. The patient tolerated the                         procedure well. Findings:      One benign-appearing, intrinsic severe stenosis was found at the        gastroesophageal junction. This stenosis measured 8 mm (inner diameter)       x 1 cm (in length). The stenosis was traversed after dilation. A TTS       dilator was passed through the scope. Dilation with an 06-04-09 mm balloon       dilator was performed to 10 mm. The dilation site was examined and       showed moderate mucosal disruption.      The examined duodenum was normal.      A medium-sized hiatal hernia was present.      Normal mucosa was found in the entire esophagus. Biopsies were taken       with a cold forceps for histology. Impression:            - Benign-appearing esophageal stenosis. Dilated.                        - Normal examined duodenum.                        - Medium-sized hiatal hernia.                        - Normal mucosa was found in the entire esophagus.                         Biopsied. Recommendation:        - Discharge patient to home (with escort).                        - Resume previous diet.                        - Continue present medications.                        - Await pathology results.                        - Repeat upper endoscopy in 2 weeks to evaluate the                         response to therapy and for retreatment.                        - Return to my office as previously scheduled. Procedure Code(s):     --- Professional ---                        [phone], Esophagogastroduodenoscopy, flexible,                         transoral; with transendoscopic balloon dilation of  esophagus (less than 30 mm diameter)                        43239, 59, Esophagogastroduodenoscopy, flexible,                         transoral; with biopsy, single or multiple Diagnosis Code(s):     --- Professional ---                        K22.2, Esophageal obstruction                        K44.9, Diaphragmatic hernia without obstruction or                         gangrene                        R13.10, Dysphagia, unspecified CPT copyright  2019 American Medical Association. All rights reserved. The codes documented in this report are preliminary and upon coder review may  be revised to meet current compliance requirements. [name], MD [name] MD, MD 12/10/2020 8:24:24 AM This report has been signed electronically. Number of Addenda: 0 Note Initiated On: 12/10/2020 7:12 AM Estimated Blood Loss:  Estimated blood loss: none.      [facility]

## 2020-12-10 NOTE — H&P (Signed)
Devin Bellows, MD [address] [place], [place], [place], [address] [address], [place], [place], [place], [address] Phone: [phone]  Fax: [phone]  Primary Care Physician:  Dion Body, MD   Pre-Procedure History & Physical: HPI:  Devin Martinez is a 80 y.o. male is here for an endoscopy    Past Medical History:  Diagnosis Date  . Asthma   . COPD (chronic obstructive pulmonary disease) ([place])   . Degenerative arthritis of left knee   . Diabetes mellitus    Type II  . Herpes   . Hyperlipidemia   . Hypertension   . LVH (left ventricular hypertrophy)    Hx. of  . Obesity   . Smoker     Past Surgical History:  Procedure Laterality Date  . BACK SURGERY    . HERNIA REPAIR    . JOINT REPLACEMENT    . LAMINECTOMY      Prior to Admission medications   Medication Sig Start Date End Date Taking? Authorizing Provider  budesonide-formoterol (SYMBICORT) 160-4.5 MCG/ACT inhaler Inhale 2 puffs into the lungs 2 (two) times daily.   Yes [provider]  losartan (COZAAR) 50 MG tablet Take 50 mg by mouth daily. 11/28/19  Yes [provider]  loteprednol (LOTEMAX) 0.2 % SUSP 1 drop each eye one time per day 02/28/20  Yes Kozlow, Donnamarie Poag, MD  metroNIDAZOLE (METROGEL) 0.75 % gel Apply 1 application topically at bedtime. 10/11/20 10/11/21 Yes Brendolyn Patty, MD  mometasone (NASONEX) 50 MCG/ACT nasal spray Place 1 spray into the nose daily. Two sprays each in each nostril 01/31/20  Yes Kozlow, Donnamarie Poag, MD  montelukast (SINGULAIR) 10 MG tablet Take 1 tablet (10 mg total) by mouth at bedtime. 01/31/20  Yes Kozlow, Donnamarie Poag, MD  olopatadine (PATANOL) 0.1 % ophthalmic solution Place 1 drop into both eyes daily. 07/14/19  Yes [provider]  prednisoLONE acetate (PRED FORTE) 1 % ophthalmic suspension 1 drop each eye every other day. 03/01/20  Yes Kozlow, Donnamarie Poag, MD  Red Yeast Rice Extract (RED YEAST RICE PO) Take 1 tablet by mouth daily.   Yes [provider]  albuterol (PROVENTIL HFA;VENTOLIN HFA) 108 (90 Base) MCG/ACT inhaler Inhale 1-2 puffs into the lungs every 6 (six) hours as needed for wheezing or shortness of breath.    [provider]  hydrochlorothiazide (HYDRODIURIL) 12.5 MG tablet Take 12.5 mg by mouth daily. Patient not taking: Reported on 12/10/2020 05/19/18   [provider]  ipratropium-albuterol (DUONEB) 0.5-2.5 (3) MG/3ML SOLN Take 3 mLs by nebulization every 6 (six) hours. 03/23/16   Epifanio Lesches, MD  tiotropium (SPIRIVA) 18 MCG inhalation capsule Place 18 mcg into inhaler and inhale daily. Patient not taking: Reported on 12/10/2020    [provider]    Allergies as of 11/27/2020 - Review Complete 10/16/2020  Allergen Reaction Noted  . [place] nut (berthollefia excelsa) skin test Itching 12/22/2019  . Enalapril Swelling 05/23/2018    Family History  Problem Relation Age of Onset  . Heart attack Mother     Social History   Socioeconomic History  . Marital status: Single    Spouse name: Not on file  . Number of children: Not on file  . Years of education: Not on file  . Highest education level: Not on file  Occupational History  . Not on file  Tobacco Use  . Smoking status: Former Smoker    Types: Cigarettes  . Smokeless tobacco: Never Used  Vaping Use  . Vaping Use: Never used  Substance and Sexual Activity  . Alcohol use: Yes    Comment: occasionally  . Drug use: No  . Sexual activity: Not on file  Other Topics Concern  . Not on file  Social History Narrative  . Not on file   Social Determinants of Health   Financial Resource Strain: Not on file  Food Insecurity: Not on file  Transportation Needs: Not on file  Physical Activity: Not on file  Stress: Not on file  Social Connections: Not on file  Intimate Partner Violence: Not on file    Review of Systems: See HPI, otherwise negative ROS  Physical Exam: BP (!) 159/65   Pulse 65   Temp (!) 97.2 F  (36.2 C) (Temporal)   Resp 18   Ht 5' 10.5" (1.791 m)   Wt 89.1 kg   SpO2 95%   BMI 27.78 kg/m  General:   Alert,  pleasant and cooperative in NAD Head:  Normocephalic and atraumatic. Neck:  Supple; no masses or thyromegaly. Lungs:  Clear throughout to auscultation, normal respiratory effort.    Heart:  +S1, +S2, Regular rate and rhythm, No edema. Abdomen:  Soft, nontender and nondistended. Normal bowel sounds, without guarding, and without rebound.   Neurologic:  Alert and  oriented x4;  grossly normal neurologically.  Impression/Plan: Devin Martinez is here for an endoscopy  to be performed for  evaluation of dysphagia    Risks, benefits, limitations, and alternatives regarding endoscopy have been reviewed with the patient.  Questions have been answered.  All parties agreeable.   Devin Bellows, MD  12/10/2020, 8:08 AM

## 2020-12-10 NOTE — Transfer of Care (Signed)
Immediate Anesthesia Transfer of Care Note  Patient: Suezanne Cheshire  Procedure(s) Performed: ESOPHAGOGASTRODUODENOSCOPY (EGD) (N/A )  Patient Location: Endoscopy Unit  Anesthesia Type:General  Level of Consciousness: drowsy  Airway & Oxygen Therapy: Patient Spontanous Breathing  Post-op Assessment: Report given to RN and Post -op Vital signs reviewed and stable  Post vital signs: Reviewed and stable  Last Vitals:  Vitals Value Taken Time  BP 122/50 12/10/20 0825  Temp    Pulse 64 12/10/20 0826  Resp 14 12/10/20 0826  SpO2 98 % 12/10/20 0826  Vitals shown include unvalidated device data.  Last Pain:  Vitals:   12/10/20 0825  TempSrc:   PainSc: 0-No pain         Complications: No complications documented.

## 2020-12-11 LAB — SURGICAL PATHOLOGY

## 2020-12-12 ENCOUNTER — Other Ambulatory Visit: Payer: Self-pay

## 2020-12-12 ENCOUNTER — Telehealth: Payer: Self-pay

## 2020-12-12 DIAGNOSIS — R1310 Dysphagia, unspecified: Secondary | ICD-10-CM

## 2020-12-12 MED ORDER — FLUTICASONE PROPIONATE HFA 220 MCG/ACT IN AERO: 2.0000 | INHALATION_SPRAY | Freq: Two times a day (BID) | RESPIRATORY_TRACT | Status: DC

## 2020-12-12 NOTE — Telephone Encounter (Signed)
Pt has been informed of results. Explained to pt Dr. Georgeann Oppenheim comments/recommendations. Will send medication to pharmacy. Pt. Verbalized understanding.

## 2020-12-12 NOTE — Telephone Encounter (Signed)
-----   Message from [name], MD sent at 12/12/2020  8:43 AM EST ----- Inform The biopsy results from his endoscopy shows that he has a condition called eosinophilic esophagitis.  It is usually related to food allergy.  That can cause chest discomfort, acid reflux-like symptoms, strictures and difficulty swallowing.  Many of the symptoms that he has.  He would need to be commenced on the fluticasone inhaler 2 puffs twice a day for 8 weeks which should not be inhaled but swallowed.  I would postpone his next endoscopy to be done after his course of inhaler because sometimes the stricture may resolve with inhaler.  Please cance the procedure.  If the patient wishes to discuss this with me before starting treatment please schedule office visit otherwise please send the medication to his pharmacy.

## 2020-12-17 ENCOUNTER — Encounter: Payer: Self-pay | Admitting: Gastroenterology

## 2020-12-17 DIAGNOSIS — E7800 Pure hypercholesterolemia, unspecified: Secondary | ICD-10-CM | POA: Diagnosis not present

## 2020-12-17 DIAGNOSIS — I10 Essential (primary) hypertension: Secondary | ICD-10-CM | POA: Diagnosis not present

## 2020-12-18 ENCOUNTER — Telehealth: Payer: Self-pay | Admitting: Gastroenterology

## 2020-12-18 NOTE — Telephone Encounter (Signed)
Patient has questions about  Fluticasone, please call to advise

## 2020-12-18 NOTE — Telephone Encounter (Signed)
Patient called to ask if he can take Omeprazole with Fluticasone.  He also wants to know if the Fluticasone can be called into [facility] on [address] in [place] area.Please call to advise

## 2020-12-19 ENCOUNTER — Other Ambulatory Visit: Payer: Self-pay

## 2020-12-19 MED ORDER — FLOVENT HFA 220 MCG/ACT IN AERO: INHALATION_SPRAY | RESPIRATORY_TRACT | 1 refills | Status: AC

## 2020-12-19 NOTE — Telephone Encounter (Signed)
Spoke with pt and informed him that we've sent the Fluticasone to his preferred pharmacy and explained that the omeprazole can be taken with the fluticasone.

## 2020-12-19 NOTE — Telephone Encounter (Signed)
Returned pt's call. Unable to contact , LVM to return call. Medication sent to pharmacy.

## 2020-12-20 ENCOUNTER — Telehealth: Payer: Self-pay | Admitting: Gastroenterology

## 2020-12-20 NOTE — Telephone Encounter (Signed)
fluticasone (FLOVENT HFA) 220 MCG/ACT inhaler 1 each 1 12/19/2020    Sig: 2 puffs by mouth 2 times daily for 8 weeks. Swallow, do not inhale. Rinse mouth with water 30 minutes after   Sent to pharmacy as: fluticasone (FLOVENT HFA) 220 MCG/ACT inhaler   E-Prescribing Status: Receipt confirmed by pharmacy (12/19/2020 3:05 PM EST)     Rx sent into [facility] in [facility] #: [phone]  It's cheaper for the patient using the [place].

## 2020-12-24 DIAGNOSIS — J449 Chronic obstructive pulmonary disease, unspecified: Secondary | ICD-10-CM | POA: Diagnosis not present

## 2020-12-24 DIAGNOSIS — Z6830 Body mass index (BMI) 30.0-30.9, adult: Secondary | ICD-10-CM | POA: Diagnosis not present

## 2020-12-24 DIAGNOSIS — Z862 Personal history of diseases of the blood and blood-forming organs and certain disorders involving the immune mechanism: Secondary | ICD-10-CM | POA: Diagnosis not present

## 2020-12-24 DIAGNOSIS — Z0000 Encounter for general adult medical examination without abnormal findings: Secondary | ICD-10-CM | POA: Diagnosis not present

## 2020-12-24 DIAGNOSIS — R21 Rash and other nonspecific skin eruption: Secondary | ICD-10-CM | POA: Diagnosis not present

## 2020-12-24 DIAGNOSIS — I10 Essential (primary) hypertension: Secondary | ICD-10-CM | POA: Diagnosis not present

## 2020-12-24 DIAGNOSIS — E6609 Other obesity due to excess calories: Secondary | ICD-10-CM | POA: Diagnosis not present

## 2020-12-28 DIAGNOSIS — M19011 Primary osteoarthritis, right shoulder: Secondary | ICD-10-CM | POA: Diagnosis not present

## 2020-12-28 DIAGNOSIS — M25541 Pain in joints of right hand: Secondary | ICD-10-CM | POA: Diagnosis not present

## 2020-12-28 DIAGNOSIS — M75101 Unspecified rotator cuff tear or rupture of right shoulder, not specified as traumatic: Secondary | ICD-10-CM | POA: Diagnosis not present

## 2020-12-28 DIAGNOSIS — M25511 Pain in right shoulder: Secondary | ICD-10-CM | POA: Diagnosis not present

## 2020-12-28 DIAGNOSIS — M180 Bilateral primary osteoarthritis of first carpometacarpal joints: Secondary | ICD-10-CM | POA: Diagnosis not present

## 2021-01-02 NOTE — Telephone Encounter (Signed)
Contacted the [facility] and they state a nurse would have to give me a call back regarding the new authorization request.

## 2021-01-14 ENCOUNTER — Telehealth (INDEPENDENT_AMBULATORY_CARE_PROVIDER_SITE_OTHER): Payer: No Typology Code available for payment source | Admitting: Gastroenterology

## 2021-01-14 DIAGNOSIS — K200 Eosinophilic esophagitis: Secondary | ICD-10-CM

## 2021-01-14 DIAGNOSIS — R1310 Dysphagia, unspecified: Secondary | ICD-10-CM

## 2021-01-14 NOTE — Progress Notes (Signed)
Devin Martinez , MD [address]  [place]  [place], [place] [address]  Main: [phone]  Fax: [phone]   Primary Care Physician: Dion Body, MD  Virtual Visit via Telephone Note  I connected with patient on 01/14/21 at  1:30 PM EDT by telephone and verified that I am speaking with the correct person using two identifiers.   I discussed the limitations, risks, security and privacy concerns of performing an evaluation and management service by telephone and the availability of in person appointments. I also discussed with the patient that there may be a patient responsible charge related to this service. The patient expressed understanding and agreed to proceed.  Location of Patient: Home Location of Provider: Home Persons involved: Patient and provider only   History of Present Illness:   Dysphagia follow-up  HPI: Devin Martinez is a 80 y.o. male    Summary of history :  He was initially referred and seen on 10/16/2020 for dysphagia which she has had for over 40 years.  A few years back he had an endoscopy and dilation which helped significantly.  Over the last few years gradually has had worsening dysphagia for solids more than liquids.  Pills occasionally gets stuck.  Some allergies to [place] nuts.  Denies any heartburn.  He has COPD.  Can walk up to his car without any problem.  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  Denies any weight loss.  Interval history 10/16/2020-01/15/2020  12/10/2020: EGD: Benign severe stenosis found at the GE junction measuring 8 mm.  Dilated to 10 mm was able to enter the stomach after dilation.  Medium size hiatal hernia was noted.  Biopsies of the esophagus were taken which showed elevated eosinophil levels suggestive of EOE with greater than 60 eosinophils per high-power field  Since his EGD his swallowing is much better.  He has not yet started his Flovent inhaler due to cost and he was not  sure how helpful it would be.  He said the omeprazole made his back pain very significantly worse and he cannot take it. Current Outpatient Medications  Medication Sig Dispense Refill  . albuterol (PROVENTIL HFA;VENTOLIN HFA) 108 (90 Base) MCG/ACT inhaler Inhale 1-2 puffs into the lungs every 6 (six) hours as needed for wheezing or shortness of breath.    . budesonide-formoterol (SYMBICORT) 160-4.5 MCG/ACT inhaler Inhale 2 puffs into the lungs 2 (two) times daily.    . fluticasone (FLOVENT HFA) 220 MCG/ACT inhaler 2 puffs by mouth 2 times daily for 8 weeks. Swallow, do not inhale. Rinse mouth with water 30 minutes after 1 each 1  . hydrochlorothiazide (HYDRODIURIL) 12.5 MG tablet Take 12.5 mg by mouth daily. (Patient not taking: Reported on 12/10/2020)  3  . ipratropium-albuterol (DUONEB) 0.5-2.5 (3) MG/3ML SOLN Take 3 mLs by nebulization every 6 (six) hours. 30 mL 1  . losartan (COZAAR) 50 MG tablet Take 50 mg by mouth daily.    Marland Kitchen loteprednol (LOTEMAX) 0.2 % SUSP 1 drop each eye one time per day 10 mL 5  . metroNIDAZOLE (METROGEL) 0.75 % gel Apply 1 application topically at bedtime. 45 g 6  . mometasone (NASONEX) 50 MCG/ACT nasal spray Place 1 spray into the nose daily. Two sprays each in each nostril 17 g 5  . montelukast (SINGULAIR) 10 MG tablet Take 1 tablet (10 mg total) by mouth at bedtime. 30 tablet 5  . olopatadine (PATANOL) 0.1 % ophthalmic solution Place 1  drop into both eyes daily.    Marland Kitchen omeprazole (PRILOSEC) 20 MG capsule Take 2 capsules (40 mg total) by mouth daily. (Patient not taking: Reported on 01/14/2021) 180 capsule 3  . prednisoLONE acetate (PRED FORTE) 1 % ophthalmic suspension 1 drop each eye every other day. 10 mL 5  . Red Yeast Rice Extract (RED YEAST RICE PO) Take 1 tablet by mouth daily.    Marland Kitchen tiotropium (SPIRIVA) 18 MCG inhalation capsule Place 18 mcg into inhaler and inhale daily. (Patient not taking: Reported on 12/10/2020)     No current facility-administered medications  for this visit.    Allergies as of 01/14/2021 - Review Complete 01/14/2021  Allergen Reaction Noted  . [place] nut (berthollefia excelsa) skin test Itching 12/22/2019  . Enalapril Swelling 05/23/2018    Review of Systems:    All systems reviewed and negative except where noted in HPI.   Observations/Objective:  Labs: CMP     Component Value Date/Time   NA 136 05/24/2018 0412   K 4.6 05/24/2018 0412   CL 99 05/24/2018 0412   CO2 27 05/24/2018 0412   GLUCOSE 179 (H) 05/24/2018 0412   BUN 23 05/24/2018 0412   CREATININE 0.87 05/24/2018 0412   CALCIUM 8.8 (L) 05/24/2018 0412   PROT 6.8 01/30/2015 0935   ALBUMIN 3.9 01/30/2015 0935   AST 27 01/30/2015 0935   ALT 20 01/30/2015 0935   ALKPHOS 89 01/30/2015 0935   BILITOT 0.6 01/30/2015 0935   GFRNONAA >60 05/24/2018 0412   GFRAA >60 05/24/2018 0412   Lab Results  Component Value Date   WBC 9.7 05/24/2018   HGB 12.7 (L) 05/24/2018   HCT 37.1 (L) 05/24/2018   MCV 84.9 05/24/2018   PLT 278 05/24/2018    Imaging Studies: No results found.  Assessment and Plan:   Devin Martinez is a 80 y.o. y/o male who is here today to see me for dysphagia follow-up.  Had a very tight stricture at the GE junction dilated on 12/10/2020 from 8 mm to 10 mm.  Biopsies of the esophagus demonstrated features of eosinophilic esophagitis.  I prescribed him a steroid inhaler for his eosinophilic esophagitis but he is yet to start it.  Discussed the importance of starting it.   Plan :   1.  Complete treatment with Flovent for 8 weeks.  Continue PPI 2.  EGD with dilation after treatment for EOE and will recheck biopsies of the esophagus to ensure eosinophil count has been decreasing 3.  Blood test to evaluate for food allergies at next office visit    I discussed the assessment and treatment plan with the patient. The patient was provided an opportunity to ask questions and all were answered. The patient agreed with the plan and demonstrated an  understanding of the instructions.   The patient was advised to call back or seek an in-person evaluation if the symptoms worsen or if the condition fails to improve as anticipated.  I provided 20 minutes of non-face-to-face time during this encounter.  Dr Devin Bellows MD,MRCP [facility]) Gastroenterology/Hepatology Pager: [phone]   Speech recognition software was used to dictate this note.

## 2021-02-19 DIAGNOSIS — J40 Bronchitis, not specified as acute or chronic: Secondary | ICD-10-CM | POA: Diagnosis not present

## 2021-02-19 DIAGNOSIS — J449 Chronic obstructive pulmonary disease, unspecified: Secondary | ICD-10-CM | POA: Diagnosis not present

## 2021-02-19 DIAGNOSIS — I10 Essential (primary) hypertension: Secondary | ICD-10-CM | POA: Diagnosis not present

## 2021-02-25 ENCOUNTER — Telehealth: Payer: Self-pay

## 2021-02-25 NOTE — Telephone Encounter (Signed)
Patient says his throat symptoms have returned. Unable to swallow or pass the food though the esophagus. Doesn't want to make an appt, yet. Wants to see what the doctor thinks?

## 2021-02-25 NOTE — Telephone Encounter (Signed)
Previously had a very tight stricture that had to be dilated due to eosinophilic esophagitis.  It may have returned and would suggest endoscopy to evaluate

## 2021-02-25 NOTE — Telephone Encounter (Signed)
Returned patients call. Informed patient of Dr. Georgeann Oppenheim recommendations with proceeding with another EGD. Patient states this is not an urgent matter just wanted some advice. He will start next round of Flovent and will contact us of things get worse. He said, he only had trouble with swallowing a butter bean. Note has been sent to Dr. Vicente Males.

## 2021-04-02 ENCOUNTER — Telehealth: Payer: Self-pay | Admitting: Gastroenterology

## 2021-04-02 ENCOUNTER — Other Ambulatory Visit: Payer: Self-pay

## 2021-04-02 DIAGNOSIS — R1310 Dysphagia, unspecified: Secondary | ICD-10-CM

## 2021-04-02 NOTE — Telephone Encounter (Signed)
Would wait until I mention it to the physician. He stated that he would want for me to call him back after 1:30 PM before rescheduled.

## 2021-04-02 NOTE — Telephone Encounter (Signed)
Patient was contacted with Dr. Verlin Grills recommendations and he stated that he would not take the Omeprazole as prescribed by Dr. Vicente Males since it would cause him to have abdominal pain, nausea, and vomiting. He then stated that he

## 2021-04-02 NOTE — Telephone Encounter (Signed)
Patient is having problems swallowing again. Please advise.

## 2021-04-02 NOTE — Telephone Encounter (Signed)
Increase omeprazole to 40 mg twice daily before breakfast and before dinner If patient can wait until next week, go ahead and schedule upper endoscopy with Dr. Vicente Males.  If he cannot wait, I can do his EGD on Thursday this week  He should avoid hard foods, but he can have full liquid diet such as smoothies or pureed or very soft diet  Cephas Darby, MD [place]  [place], [place] [address]  Main: [phone]  Fax: [phone] Pager: [phone]

## 2021-04-02 NOTE — Telephone Encounter (Signed)
Called patient back and he stated that he wanted to go right ahead and schedule an EGD with Dr. Vicente Males. Therefore, it was scheduled to be done on 04/16/2021 at [facility].

## 2021-04-02 NOTE — Telephone Encounter (Signed)
Patient stated that he continues to have dysphagia and that certain foods are hard to pass through his esophagus. Patient tries to chew his food thoroughly before swallowing it. I mentioned to him that Dr. Vicente Males had recommended an EGD and he stated that he wanted me to mention it to the physician first. Please advise.

## 2021-04-08 ENCOUNTER — Telehealth: Payer: Self-pay | Admitting: Gastroenterology

## 2021-04-08 NOTE — Telephone Encounter (Signed)
omeprazole (PRILOSEC) 20 MG capsule, patient called and having a bad reaction to this medication, Back pain and feeling bad all over and no energy. What else can he take? Still having problems with dysphagia.

## 2021-04-08 NOTE — Telephone Encounter (Signed)
Can try nexium 40 mg once daily

## 2021-04-08 NOTE — Telephone Encounter (Signed)
Patient was contacted and recommended for him to stop taking the Omeprazole. Patient stated that he would like to try something else. In addition, he stated that he wasn't sure if he wanted to move forward with the EGD since he might need another one later on due to a consecutive problem. Please advise.

## 2021-04-09 MED ORDER — ESOMEPRAZOLE MAGNESIUM 40 MG PO CPDR: 40.0000 mg | DELAYED_RELEASE_CAPSULE | Freq: Every day | ORAL | 0 refills | Status: AC

## 2021-04-09 NOTE — Telephone Encounter (Signed)
Called patient and left him a detailed message as requested by the patient when I spoke with him last. I let him know that Dr. Vicente Males is recommending for him to start taking Nexium 40 MG one capsule daily. This prescription was sent to his pharmacy. I also told the patient that it would be recommended for him to move forward with the EGD but if chose not to, to please call us to let us know.

## 2021-04-15 ENCOUNTER — Ambulatory Visit (INDEPENDENT_AMBULATORY_CARE_PROVIDER_SITE_OTHER): Payer: No Typology Code available for payment source | Admitting: Dermatology

## 2021-04-15 ENCOUNTER — Other Ambulatory Visit: Payer: Self-pay

## 2021-04-15 DIAGNOSIS — L820 Inflamed seborrheic keratosis: Secondary | ICD-10-CM | POA: Diagnosis not present

## 2021-04-15 DIAGNOSIS — L578 Other skin changes due to chronic exposure to nonionizing radiation: Secondary | ICD-10-CM

## 2021-04-15 DIAGNOSIS — D1800 Hemangioma unspecified site: Secondary | ICD-10-CM

## 2021-04-15 DIAGNOSIS — L570 Actinic keratosis: Secondary | ICD-10-CM | POA: Diagnosis not present

## 2021-04-15 DIAGNOSIS — L814 Other melanin hyperpigmentation: Secondary | ICD-10-CM

## 2021-04-15 DIAGNOSIS — L219 Seborrheic dermatitis, unspecified: Secondary | ICD-10-CM

## 2021-04-15 DIAGNOSIS — L719 Rosacea, unspecified: Secondary | ICD-10-CM

## 2021-04-15 DIAGNOSIS — D692 Other nonthrombocytopenic purpura: Secondary | ICD-10-CM

## 2021-04-15 DIAGNOSIS — L821 Other seborrheic keratosis: Secondary | ICD-10-CM

## 2021-04-15 DIAGNOSIS — Z1283 Encounter for screening for malignant neoplasm of skin: Secondary | ICD-10-CM

## 2021-04-15 DIAGNOSIS — D229 Melanocytic nevi, unspecified: Secondary | ICD-10-CM

## 2021-04-15 NOTE — Progress Notes (Signed)
Follow-Up Visit   Subjective  Devin Martinez is a 80 y.o. male who presents for the following: Annual Exam (Patient here today for tbse. Patient has spots on temple and right ear that he would like checked. Some redness on nose he takes ). Patient here for full body skin exam and skin cancer screening.  The following portions of the chart were reviewed this encounter and updated as appropriate:  Tobacco  Allergies  Meds  Problems  Med Hx  Surg Hx  Fam Hx      Objective  Well appearing patient in no apparent distress; mood and affect are within normal limits.  A full examination was performed including scalp, head, eyes, ears, nose, lips, neck, chest, axillae, abdomen, back, buttocks, bilateral upper extremities, bilateral lower extremities, hands, feet, fingers, toes, fingernails, and toenails. All findings within normal limits unless otherwise noted below.  Nose Mid face erythema with telangiectasias +/- scattered inflammatory papules.   Right Ear x 3, left ear x 2, right sideburn x 1 (6) Erythematous thin papules/macules with gritty scale.   right temple x 3 (3) Erythematous keratotic or waxy stuck-on papule or plaque.   bilateral ears Pink patches with greasy scale.   Assessment & Plan  Rosacea Nose Rosacea is a chronic progressive skin condition usually affecting the face of adults, causing redness and/or acne bumps. It is treatable but not curable. It sometimes affects the eyes (ocular rosacea) as well. It may respond to topical and/or systemic medication and can flare with stress, sun exposure, alcohol, exercise and some foods.  Daily application of broad spectrum spf 30+ sunscreen to face is recommended to reduce flares.  Continue metronidazole 0.75% gel   Related Medications metroNIDAZOLE (METROGEL) 0.75 % gel Apply 1 application topically at bedtime.  Actinic keratosis (6) Right Ear x 3, left ear x 2, right sideburn x 1 Actinic keratoses are precancerous spots  that appear secondary to cumulative UV radiation exposure/sun exposure over time. They are chronic with expected duration over 1 year. A portion of actinic keratoses will progress to squamous cell carcinoma of the skin. It is not possible to reliably predict which spots will progress to skin cancer and so treatment is recommended to prevent development of skin cancer. Recommend daily broad spectrum sunscreen SPF 30+ to sun-exposed areas, reapply every 2 hours as needed.  Recommend staying in the shade or wearing long sleeves, sun glasses (UVA+UVB protection) and wide brim hats (4-inch brim around the entire circumference of the hat). Call for new or changing lesions.  Destruction of lesion - Right Ear x 3, left ear x 2, right sideburn x 1 Complexity: simple   Destruction method: cryotherapy   Informed consent: discussed and consent obtained   Timeout:  patient name, date of birth, surgical site, and procedure verified Lesion destroyed using liquid nitrogen: Yes   Region frozen until ice ball extended beyond lesion: Yes   Outcome: patient tolerated procedure well with no complications   Post-procedure details: wound care instructions given    Inflamed seborrheic keratosis right temple x 3 Prior to procedure, discussed risks of blister formation, small wound, skin dyspigmentation, or rare scar following cryotherapy. Recommend Vaseline ointment to treated areas while healing. Destruction of lesion - right temple x 3 Complexity: simple   Destruction method: cryotherapy   Informed consent: discussed and consent obtained   Timeout:  patient name, date of birth, surgical site, and procedure verified Lesion destroyed using liquid nitrogen: Yes   Region frozen until ice  ball extended beyond lesion: Yes   Outcome: patient tolerated procedure well with no complications   Post-procedure details: wound care instructions given    Seborrheic dermatitis bilateral ears Seborrheic Dermatitis  -  is a  chronic persistent rash characterized by pinkness and scaling most commonly of the mid face but also can occur on the scalp (dandruff), ears; mid chest and mid back. It tends to be exacerbated by stress and cooler weather.  People who have neurologic disease may experience new onset or exacerbation of existing seborrheic dermatitis.  The condition is not curable but treatable and can be controlled.  Patient deferred treatment   Skin cancer screening  Lentigines - Scattered tan macules - Due to sun exposure - Benign-appering, observe - Recommend daily broad spectrum sunscreen SPF 30+ to sun-exposed areas, reapply every 2 hours as needed. - Call for any changes  Seborrheic Keratoses - Stuck-on, waxy, tan-brown papules and/or plaques  - Benign-appearing - Discussed benign etiology and prognosis. - Observe - Call for any changes  Purpura - Chronic; persistent and recurrent.  Treatable, but not curable. - Violaceous macules and patches on arms  - Benign - Related to trauma, age, sun damage and/or use of blood thinners, chronic use of topical and/or oral steroids - Observe - Can use OTC arnica containing moisturizer such as Dermend Bruise Formula if desired - Call for worsening or other concerns  Melanocytic Nevi - Tan-brown and/or pink-flesh-colored symmetric macules and papules - Benign appearing on exam today - Observation - Call clinic for new or changing moles - Recommend daily use of broad spectrum spf 30+ sunscreen to sun-exposed areas.   Hemangiomas - Red papules - Discussed benign nature - Observe - Call for any changes  Actinic Damage - Chronic condition, secondary to cumulative UV/sun exposure - diffuse scaly erythematous macules with underlying dyspigmentation - Recommend daily broad spectrum sunscreen SPF 30+ to sun-exposed areas, reapply every 2 hours as needed.  - Staying in the shade or wearing long sleeves, sun glasses (UVA+UVB protection) and wide brim hats  (4-inch brim around the entire circumference of the hat) are also recommended for sun protection.  - Call for new or changing lesions.  Skin cancer screening performed today.  Return in about 1 year (around 04/15/2022) for tbse. IRuthell Rummage, CMA, am acting as scribe for Sarina Ser, MD.  Documentation: I have reviewed the above documentation for accuracy and completeness, and I agree with the above.  Sarina Ser, MD

## 2021-04-15 NOTE — Patient Instructions (Addendum)
Actinic keratoses are precancerous spots that appear secondary to cumulative UV radiation exposure/sun exposure over time. They are chronic with expected duration over 1 year. A portion of actinic keratoses will progress to squamous cell carcinoma of the skin. It is not possible to reliably predict which spots will progress to skin cancer and so treatment is recommended to prevent development of skin cancer.  Recommend daily broad spectrum sunscreen SPF 30+ to sun-exposed areas, reapply every 2 hours as needed.  Recommend staying in the shade or wearing long sleeves, sun glasses (UVA+UVB protection) and wide brim hats (4-inch brim around the entire circumference of the hat). Call for new or changing lesions. If you have any questions or concerns for your doctor, please call our main line at [phone] and press option 4 to reach your doctor's medical assistant. If no one answers, please leave a voicemail as directed and we will return your call as soon as possible. Messages left after 4 pm will be answered the following business day.   You may also send [place] a message via [place]. We typically respond to MyChart messages within 1-2 business days.  For prescription refills, please ask your pharmacy to contact our office. Our fax number is [phone].  If you have an urgent issue when the clinic is closed that cannot wait until the next business day, you can page your doctor at the number below.    Please note that while we do our best to be available for urgent issues outside of office hours, we are not available 24/7.   If you have an urgent issue and are unable to reach [place], you may choose to seek medical care at your doctor's office, retail clinic, urgent care center, or emergency room.  If you have a medical emergency, please immediately call 911 or go to the emergency department.  Pager Numbers  - Dr. Nehemiah Massed: [phone]  - Dr. Laurence Ferrari: [phone]  - Dr. Nicole Kindred:  [phone]  In the event of inclement weather, please call our main line at [phone] for an update on the status of any delays or closures.  Dermatology Medication Tips: Please keep the boxes that topical medications come in in order to help keep track of the instructions about where and how to use these. Pharmacies typically print the medication instructions only on the boxes and not directly on the medication tubes.   If your medication is too expensive, please contact our office at [phone] option 4 or send [place] a message through [place].   We are unable to tell what your co-pay for medications will be in advance as this is different depending on your insurance coverage. However, we may be able to find a substitute medication at lower cost or fill out paperwork to get insurance to cover a needed medication.   If a prior authorization is required to get your medication covered by your insurance company, please allow [place] 1-2 business days to complete this process.  Drug prices often vary depending on where the prescription is filled and some pharmacies may offer cheaper prices.  The website www.goodrx.com contains coupons for medications through different pharmacies. The prices here do not account for what the cost may be with help from insurance (it may be cheaper with your insurance), but the website can give you the price if you did not use any insurance.  - You can print the associated coupon and take it with your prescription to the pharmacy.  - You may also stop  by our office during regular business hours and pick up a GoodRx coupon card.  - If you need your prescription sent electronically to a different pharmacy, notify our office through [facility] or by phone at [phone] option 4.

## 2021-04-16 ENCOUNTER — Encounter
Admission: RE | Disposition: A | Discharge status: Home / Self Care | Payer: Self-pay | Source: Ambulatory Visit | Attending: Gastroenterology

## 2021-04-16 ENCOUNTER — Ambulatory Visit: Payer: No Typology Code available for payment source | Admitting: Pediatrics

## 2021-04-16 ENCOUNTER — Ambulatory Visit
Admission: RE | Admit: 2021-04-16 | Discharge: 2021-04-16 | Disposition: A | Discharge status: Home / Self Care | Payer: No Typology Code available for payment source | Source: Ambulatory Visit | Attending: Gastroenterology | Admitting: Gastroenterology

## 2021-04-16 ENCOUNTER — Encounter: Payer: Self-pay | Admitting: Gastroenterology

## 2021-04-16 DIAGNOSIS — Z8249 Family history of ischemic heart disease and other diseases of the circulatory system: Secondary | ICD-10-CM | POA: Insufficient documentation

## 2021-04-16 DIAGNOSIS — Z91018 Allergy to other foods: Secondary | ICD-10-CM | POA: Insufficient documentation

## 2021-04-16 DIAGNOSIS — R1310 Dysphagia, unspecified: Secondary | ICD-10-CM | POA: Diagnosis not present

## 2021-04-16 DIAGNOSIS — Z87891 Personal history of nicotine dependence: Secondary | ICD-10-CM | POA: Insufficient documentation

## 2021-04-16 DIAGNOSIS — E785 Hyperlipidemia, unspecified: Secondary | ICD-10-CM | POA: Diagnosis not present

## 2021-04-16 DIAGNOSIS — K222 Esophageal obstruction: Secondary | ICD-10-CM | POA: Diagnosis not present

## 2021-04-16 DIAGNOSIS — Z7951 Long term (current) use of inhaled steroids: Secondary | ICD-10-CM | POA: Diagnosis not present

## 2021-04-16 DIAGNOSIS — B3781 Candidal esophagitis: Secondary | ICD-10-CM | POA: Diagnosis not present

## 2021-04-16 DIAGNOSIS — Z888 Allergy status to other drugs, medicaments and biological substances status: Secondary | ICD-10-CM | POA: Diagnosis not present

## 2021-04-16 DIAGNOSIS — E119 Type 2 diabetes mellitus without complications: Secondary | ICD-10-CM | POA: Diagnosis not present

## 2021-04-16 DIAGNOSIS — Z79899 Other long term (current) drug therapy: Secondary | ICD-10-CM | POA: Diagnosis not present

## 2021-04-16 DIAGNOSIS — J449 Chronic obstructive pulmonary disease, unspecified: Secondary | ICD-10-CM | POA: Insufficient documentation

## 2021-04-16 HISTORY — PX: ESOPHAGOGASTRODUODENOSCOPY (EGD) WITH PROPOFOL: SHX5813

## 2021-04-16 LAB — KOH PREP

## 2021-04-16 SURGERY — ESOPHAGOGASTRODUODENOSCOPY (EGD) WITH PROPOFOL
Anesthesia: General

## 2021-04-16 MED ORDER — LIDOCAINE HCL (CARDIAC) PF 100 MG/5ML IV SOSY
PREFILLED_SYRINGE | INTRAVENOUS | Status: DC | PRN
  Administered 2021-04-16: 40 mg via INTRAVENOUS

## 2021-04-16 MED ORDER — SODIUM CHLORIDE 0.9 % IV SOLN
INTRAVENOUS | Status: DC
  Administered 2021-04-16: 20 mL/h via INTRAVENOUS

## 2021-04-16 MED ORDER — PROPOFOL 500 MG/50ML IV EMUL
INTRAVENOUS | Status: DC | PRN
  Administered 2021-04-16: 150 ug/kg/min via INTRAVENOUS

## 2021-04-16 MED ORDER — EPHEDRINE SULFATE 50 MG/ML IJ SOLN
INTRAMUSCULAR | Status: DC | PRN
  Administered 2021-04-16: 10 mg via INTRAVENOUS

## 2021-04-16 MED ORDER — PROPOFOL 10 MG/ML IV BOLUS
INTRAVENOUS | Status: AC
  Filled 2021-04-16: qty 20

## 2021-04-16 MED ORDER — PROPOFOL 10 MG/ML IV BOLUS
INTRAVENOUS | Status: DC | PRN
  Administered 2021-04-16: 70 mg via INTRAVENOUS
  Administered 2021-04-16: 10 mg via INTRAVENOUS

## 2021-04-16 NOTE — Anesthesia Postprocedure Evaluation (Signed)
Anesthesia Post Note  Patient: Devin Martinez  Procedure(s) Performed: ESOPHAGOGASTRODUODENOSCOPY (EGD) WITH PROPOFOL  Patient location during evaluation: Endoscopy Anesthesia Type: General Level of consciousness: awake and alert Pain management: pain level controlled Vital Signs Assessment: post-procedure vital signs reviewed and stable Respiratory status: spontaneous breathing, nonlabored ventilation, respiratory function stable and patient connected to nasal cannula oxygen Cardiovascular status: blood pressure returned to baseline and stable Postop Assessment: no apparent nausea or vomiting Anesthetic complications: no   No notable events documented.   Last Vitals:  Vitals:   04/16/21 0951 04/16/21 1001  BP: (!) 129/53 (!) 146/56  Pulse: (!) 56 (!) 56  Resp: 12   Temp:    SpO2: 98% 98%    Last Pain:  Vitals:   04/16/21 1001  TempSrc:   PainSc: 0-No pain                 Larey Dresser

## 2021-04-16 NOTE — Anesthesia Preprocedure Evaluation (Addendum)
Anesthesia Evaluation  Patient identified by MRN, date of birth, ID band Patient awake    Reviewed: Allergy & Precautions, H&P , NPO status , Patient's Chart, lab work & pertinent test results, reviewed documented beta blocker date and time   History of Anesthesia Complications Negative for: history of anesthetic complications  Airway Mallampati: III  TM Distance: >3 FB Neck ROM: full    Dental  (+) Dental Advidsory Given, Upper Dentures, Lower Dentures,    Pulmonary neg shortness of breath, asthma , COPD (uses O2 nightly),  COPD inhaler and oxygen dependent, neg recent URI, former smoker,    Pulmonary exam normal breath sounds clear to auscultation       Cardiovascular Exercise Tolerance: Good hypertension, (-) angina(-) Past MI and (-) Cardiac Stents Normal cardiovascular exam(-) dysrhythmias (-) Valvular Problems/Murmurs Rhythm:regular Rate:Normal   METs >4; denies CP w/ exertion; endorses stable SOB related to COPD   Neuro/Psych negative neurological ROS  negative psych ROS   GI/Hepatic Neg liver ROS, GERD  ,  Endo/Other  diabetes  Renal/GU negative Renal ROS  negative genitourinary   Musculoskeletal  (+) Arthritis ,   Abdominal (+) - obese,   Peds  Hematology negative hematology ROS (+)   Anesthesia Other Findings Past Medical History: No date: Asthma No date: COPD (chronic obstructive pulmonary disease) (HCC) No date: Degenerative arthritis of left knee No date: Diabetes mellitus     Comment:  Type II No date: Herpes No date: Hyperlipidemia No date: Hypertension No date: LVH (left ventricular hypertrophy)     Comment:  Hx. of No date: Obesity No date: Smoker   Reproductive/Obstetrics negative OB ROS                            Anesthesia Physical  Anesthesia Plan  ASA: 4  Anesthesia Plan: General   Post-op Pain Management:    Induction: Intravenous  PONV Risk Score  and Plan: 2 and TIVA and Propofol infusion  Airway Management Planned: Natural Airway and Nasal Cannula  Additional Equipment: None  Intra-op Plan:   Post-operative Plan:   Informed Consent: I have reviewed the patients History and Physical, chart, labs and discussed the procedure including the risks, benefits and alternatives for the proposed anesthesia with the patient or authorized representative who has indicated his/her understanding and acceptance.     Dental advisory given  Plan Discussed with: Anesthesiologist and CRNA  Anesthesia Plan Comments:        Anesthesia Quick Evaluation

## 2021-04-16 NOTE — H&P (Signed)
Devin Bellows, MD [address] [place], [place], [place], [address] [address], [place], [place], [place], [address] Phone: [phone]  Fax: [phone]  Primary Care Physician:  Dion Body, MD   Pre-Procedure History & Physical: HPI:  Devin Martinez is a 80 y.o. male is here for an endoscopy    Past Medical History:  Diagnosis Date   Asthma    COPD (chronic obstructive pulmonary disease) ([place])    Degenerative arthritis of left knee    Diabetes mellitus    Type II   Herpes    Hyperlipidemia    Hypertension    LVH (left ventricular hypertrophy)    Hx. of   Obesity    Smoker     Past Surgical History:  Procedure Laterality Date   BACK SURGERY     ESOPHAGOGASTRODUODENOSCOPY N/A 12/10/2020   Procedure: ESOPHAGOGASTRODUODENOSCOPY (EGD);  Surgeon: Devin Bellows, MD;  Location: [facility] ENDOSCOPY;  Service: Gastroenterology;  Laterality: N/A;   HERNIA REPAIR     JOINT REPLACEMENT     LAMINECTOMY      Prior to Admission medications   Medication Sig Start Date End Date Taking? Authorizing Provider  albuterol (PROVENTIL HFA;VENTOLIN HFA) 108 (90 Base) MCG/ACT inhaler Inhale 1-2 puffs into the lungs every 6 (six) hours as needed for wheezing or shortness of breath.   Yes [provider]  budesonide-formoterol (SYMBICORT) 160-4.5 MCG/ACT inhaler Inhale 2 puffs into the lungs 2 (two) times daily.   Yes [provider]  esomeprazole (NEXIUM) 40 MG capsule Take 1 capsule (40 mg total) by mouth daily at 12 noon. 04/09/21  Yes Devin Bellows, MD  fluticasone (FLOVENT HFA) 220 MCG/ACT inhaler 2 puffs by mouth 2 times daily for 8 weeks. Swallow, do not inhale. Rinse mouth with water 30 minutes after 12/19/20  Yes Devin Bellows, MD  ipratropium-albuterol (DUONEB) 0.5-2.5 (3) MG/3ML SOLN Take 3 mLs by nebulization every 6 (six) hours. 03/23/16  Yes Epifanio Lesches, MD  losartan (COZAAR) 50 MG tablet Take 50 mg by mouth daily. 11/28/19  Yes [provider]   loteprednol (LOTEMAX) 0.2 % SUSP 1 drop each eye one time per day 02/28/20  Yes Kozlow, Donnamarie Poag, MD  metroNIDAZOLE (METROGEL) 0.75 % gel Apply 1 application topically at bedtime. 10/11/20 10/11/21 Yes Brendolyn Patty, MD  mometasone (NASONEX) 50 MCG/ACT nasal spray Place 1 spray into the nose daily. Two sprays each in each nostril 01/31/20  Yes Kozlow, Donnamarie Poag, MD  montelukast (SINGULAIR) 10 MG tablet Take 1 tablet (10 mg total) by mouth at bedtime. 01/31/20  Yes Kozlow, Donnamarie Poag, MD  olopatadine (PATANOL) 0.1 % ophthalmic solution Place 1 drop into both eyes daily. 07/14/19  Yes [provider]  prednisoLONE acetate (PRED FORTE) 1 % ophthalmic suspension 1 drop each eye every other day. 03/01/20  Yes Kozlow, Donnamarie Poag, MD  Red Yeast Rice Extract (RED YEAST RICE PO) Take 1 tablet by mouth daily.   Yes [provider]  hydrochlorothiazide (HYDRODIURIL) 12.5 MG tablet Take 12.5 mg by mouth daily. Patient not taking: No sig reported 05/19/18   [provider]  tiotropium (SPIRIVA) 18 MCG inhalation capsule Place 18 mcg into inhaler and inhale daily. Patient not taking: No sig reported    [provider]    Allergies as of 04/02/2021 - Review Complete 01/14/2021  Allergen Reaction Noted   Bolivia nut (berthollefia excelsa) skin test Itching 12/22/2019   Enalapril Swelling 05/23/2018    Family History  Problem Relation  Age of Onset   Heart attack Mother     Social History   Socioeconomic History   Marital status: Single    Spouse name: Not on file   Number of children: Not on file   Years of education: Not on file   Highest education level: Not on file  Occupational History   Not on file  Tobacco Use   Smoking status: Former    Pack years: 0.00    Types: Cigarettes   Smokeless tobacco: Never  Vaping Use   Vaping Use: Never used  Substance and Sexual Activity   Alcohol use: Yes    Comment: occasionally   Drug use: No   Sexual activity: Not on file  Other  Topics Concern   Not on file  Social History Narrative   Not on file   Social Determinants of Health   Financial Resource Strain: Not on file  Food Insecurity: Not on file  Transportation Needs: Not on file  Physical Activity: Not on file  Stress: Not on file  Social Connections: Not on file  Intimate Partner Violence: Not on file    Review of Systems: See HPI, otherwise negative ROS  Physical Exam: BP (!) 165/52   Pulse (!) 57   Temp (!) 96.3 F (35.7 C) (Temporal)   Resp 20   Ht 6' (1.829 m)   Wt 88.9 kg   SpO2 96%   BMI 26.58 kg/m  General:   Alert,  pleasant and cooperative in NAD Head:  Normocephalic and atraumatic. Neck:  Supple; no masses or thyromegaly. Lungs:  Clear throughout to auscultation, normal respiratory effort.    Heart:  +S1, +S2, Regular rate and rhythm, No edema. Abdomen:  Soft, nontender and nondistended. Normal bowel sounds, without guarding, and without rebound.   Neurologic:  Alert and  oriented x4;  grossly normal neurologically.  Impression/Plan: Devin Martinez is here for an endoscopy  to be performed for  evaluation of dysphagia    Risks, benefits, limitations, and alternatives regarding endoscopy have been reviewed with the patient.  Questions have been answered.  All parties agreeable.   Devin Bellows, MD  04/16/2021, 8:58 AM dysphagia

## 2021-04-16 NOTE — Op Note (Signed)
[facility] Gastroenterology Patient Name: Devin Martinez Procedure Date: 04/16/2021 9:02 AM MRN: [id number] Account #: [id number] Date of Birth: 17-Nov-1940 Admit Type: Outpatient Age: 80 Room: [facility] ENDO ROOM 2 Gender: Male Note Status: Finalized Procedure:             Upper GI endoscopy Indications:           Dysphagia Providers:             [name] MD, MD Referring MD:          Dion Body (Referring MD) Medicines:             Monitored Anesthesia Care Complications:         No immediate complications. Procedure:             Pre-Anesthesia Assessment:                        - Prior to the procedure, a History and Physical was                         performed, and patient medications, allergies and                         sensitivities were reviewed. The patient's tolerance                         of previous anesthesia was reviewed.                        - The risks and benefits of the procedure and the                         sedation options and risks were discussed with the                         patient. All questions were answered and informed                         consent was obtained.                        - ASA Grade Assessment: II - A patient with mild                         systemic disease.                        After obtaining informed consent, the endoscope was                         passed under direct vision. Throughout the procedure,                         the patient's blood pressure, pulse, and oxygen                         saturations were monitored continuously. The Endoscope                         was introduced through the mouth, and advanced to the  third part of duodenum. The upper GI endoscopy was                         accomplished without difficulty. The patient tolerated                         the procedure well. Findings:      One benign-appearing, intrinsic moderate stenosis was found at  the       gastroesophageal junction. This stenosis measured 1.2 cm (inner       diameter) x 1 cm (in length). The stenosis was traversed. A TTS dilator       was passed through the scope. Dilation with a 12-13.5-15 mm balloon       dilator was performed to 15 mm. The dilation site was examined and       showed moderate mucosal disruption.      Patchy, white plaques were found in the entire esophagus. Brushings for       KOH prep were obtained in the entire esophagus. This was biopsied with a       cold forceps for evaluation of eosinophilic esophagitis.      The stomach was normal.      The examined duodenum was normal.      The cardia and gastric fundus were normal on retroflexion. Impression:            - Benign-appearing esophageal stenosis. Dilated.                        - Esophageal plaques were found, suspicious for                         candidiasis. Brushings performed. Biopsied.                        - Normal stomach.                        - Normal examined duodenum. Recommendation:        - Await pathology results.                        - Discharge patient to home (with escort).                        - Resume previous diet.                        - Continue present medications.                        - Await pathology results.                        - Return to my office in 4 weeks. Procedure Code(s):     --- Professional ---                        [phone], Esophagogastroduodenoscopy, flexible,                         transoral; with transendoscopic balloon dilation of  esophagus (less than 30 mm diameter)                        43239, 59, Esophagogastroduodenoscopy, flexible,                         transoral; with biopsy, single or multiple Diagnosis Code(s):     --- Professional ---                        K22.2, Esophageal obstruction                        K22.9, Disease of esophagus, unspecified                        R13.10, Dysphagia,  unspecified CPT copyright 2019 American Medical Association. All rights reserved. The codes documented in this report are preliminary and upon coder review may  be revised to meet current compliance requirements. [name], MD [name] MD, MD 04/16/2021 9:24:41 AM This report has been signed electronically. Number of Addenda: 0 Note Initiated On: 04/16/2021 9:02 AM Estimated Blood Loss:  Estimated blood loss: none.      [facility]

## 2021-04-16 NOTE — Transfer of Care (Signed)
Immediate Anesthesia Transfer of Care Note  Patient: Devin Martinez  Procedure(s) Performed: Procedure(s): ESOPHAGOGASTRODUODENOSCOPY (EGD) WITH PROPOFOL (N/A)  Patient Location: PACU and Endoscopy Unit  Anesthesia Type:General  Level of Consciousness: sedated  Airway & Oxygen Therapy: Patient Spontanous Breathing and Patient connected to nasal cannula oxygen  Post-op Assessment: Report given to RN and Post -op Vital signs reviewed and stable  Post vital signs: Reviewed and stable  Last Vitals:  Vitals:   04/16/21 0805 04/16/21 0931  BP: (!) 165/52 (!) 119/49  Pulse: (!) 57 67  Resp: 20 18  Temp: (!) 35.7 C   SpO2: 78% 24%    Complications: No apparent anesthesia complications

## 2021-04-16 NOTE — Anesthesia Procedure Notes (Signed)
Date/Time: 04/16/2021 9:11 AM Performed by: Doreen Salvage, CRNA Pre-anesthesia Checklist: Patient identified, Emergency Drugs available, Suction available and Patient being monitored Patient Re-evaluated:Patient Re-evaluated prior to induction Oxygen Delivery Method: Nasal cannula Induction Type: IV induction Dental Injury: Teeth and Oropharynx as per pre-operative assessment  Comments: Nasal cannula with etCO2 monitoring

## 2021-04-17 ENCOUNTER — Telehealth: Payer: Self-pay | Admitting: Gastroenterology

## 2021-04-17 ENCOUNTER — Encounter: Payer: Self-pay | Admitting: Gastroenterology

## 2021-04-17 LAB — SURGICAL PATHOLOGY

## 2021-04-17 NOTE — Telephone Encounter (Signed)
Called patient back and left him a voicemail letting him know to call me back.

## 2021-04-17 NOTE — Telephone Encounter (Signed)
Patient asks for call back.  He left no details on vm.

## 2021-04-18 ENCOUNTER — Encounter: Payer: Self-pay | Admitting: Dermatology

## 2021-04-19 NOTE — Telephone Encounter (Signed)
Called patient back and left him another voicemail that I have returned his call. Patient is to call back.

## 2021-04-24 ENCOUNTER — Telehealth: Payer: Self-pay

## 2021-04-24 MED ORDER — FLUCONAZOLE 100 MG PO TABS: 100.0000 mg | ORAL_TABLET | Freq: Every day | ORAL | 0 refills | Status: AC

## 2021-04-24 NOTE — Telephone Encounter (Signed)
-----   Message from [name], MD sent at 04/22/2021 12:58 PM EDT ----- Inform that biopsies from his esophagus showed yeast i.e. Candida esophagitis.  Suggest Diflucan day 1:  200 mg x 1 dose and then followed by 100 mg once daily for 13 days for total of 14 days treatment

## 2021-04-24 NOTE — Telephone Encounter (Signed)
Called patient but had to leave him a detailed message via voicemail. I will also send him a message via MyChart.

## 2021-06-17 DIAGNOSIS — Z862 Personal history of diseases of the blood and blood-forming organs and certain disorders involving the immune mechanism: Secondary | ICD-10-CM | POA: Diagnosis not present

## 2021-06-17 DIAGNOSIS — I10 Essential (primary) hypertension: Secondary | ICD-10-CM | POA: Diagnosis not present

## 2021-06-24 DIAGNOSIS — I10 Essential (primary) hypertension: Secondary | ICD-10-CM | POA: Diagnosis not present

## 2021-06-24 DIAGNOSIS — Z862 Personal history of diseases of the blood and blood-forming organs and certain disorders involving the immune mechanism: Secondary | ICD-10-CM | POA: Diagnosis not present

## 2021-06-24 DIAGNOSIS — Z136 Encounter for screening for cardiovascular disorders: Secondary | ICD-10-CM | POA: Diagnosis not present

## 2021-06-24 DIAGNOSIS — Z0000 Encounter for general adult medical examination without abnormal findings: Secondary | ICD-10-CM | POA: Diagnosis not present

## 2021-06-24 DIAGNOSIS — H00011 Hordeolum externum right upper eyelid: Secondary | ICD-10-CM | POA: Diagnosis not present

## 2021-10-07 DIAGNOSIS — Z20822 Contact with and (suspected) exposure to covid-19: Secondary | ICD-10-CM | POA: Diagnosis not present

## 2021-10-07 DIAGNOSIS — Z03818 Encounter for observation for suspected exposure to other biological agents ruled out: Secondary | ICD-10-CM | POA: Diagnosis not present

## 2021-12-30 DIAGNOSIS — J449 Chronic obstructive pulmonary disease, unspecified: Secondary | ICD-10-CM | POA: Diagnosis not present

## 2021-12-30 DIAGNOSIS — J309 Allergic rhinitis, unspecified: Secondary | ICD-10-CM | POA: Diagnosis not present

## 2021-12-30 DIAGNOSIS — I10 Essential (primary) hypertension: Secondary | ICD-10-CM | POA: Diagnosis not present

## 2022-04-17 ENCOUNTER — Encounter: Payer: Self-pay | Admitting: Dermatology

## 2022-04-17 ENCOUNTER — Ambulatory Visit (INDEPENDENT_AMBULATORY_CARE_PROVIDER_SITE_OTHER): Payer: No Typology Code available for payment source | Admitting: Dermatology

## 2022-04-17 DIAGNOSIS — L821 Other seborrheic keratosis: Secondary | ICD-10-CM

## 2022-04-17 DIAGNOSIS — L570 Actinic keratosis: Secondary | ICD-10-CM

## 2022-04-17 DIAGNOSIS — D229 Melanocytic nevi, unspecified: Secondary | ICD-10-CM

## 2022-04-17 DIAGNOSIS — L719 Rosacea, unspecified: Secondary | ICD-10-CM | POA: Diagnosis not present

## 2022-04-17 DIAGNOSIS — D1800 Hemangioma unspecified site: Secondary | ICD-10-CM

## 2022-04-17 DIAGNOSIS — L578 Other skin changes due to chronic exposure to nonionizing radiation: Secondary | ICD-10-CM | POA: Diagnosis not present

## 2022-04-17 DIAGNOSIS — Z1283 Encounter for screening for malignant neoplasm of skin: Secondary | ICD-10-CM | POA: Diagnosis not present

## 2022-04-17 DIAGNOSIS — L820 Inflamed seborrheic keratosis: Secondary | ICD-10-CM | POA: Diagnosis not present

## 2022-04-17 DIAGNOSIS — L814 Other melanin hyperpigmentation: Secondary | ICD-10-CM

## 2022-04-17 DIAGNOSIS — L720 Epidermal cyst: Secondary | ICD-10-CM

## 2022-04-17 NOTE — Patient Instructions (Addendum)
Cryotherapy Aftercare  Wash gently with soap and water everyday.   Apply Vaseline and Band-Aid daily until healed.    Aklief cream Apply to white bump on left corner of mouth nightly.  All you need is a small amount   Due to recent changes in healthcare laws, you may see results of your pathology and/or laboratory studies on MyChart before the doctors have had a chance to review them. We understand that in some cases there may be results that are confusing or concerning to you. Please understand that not all results are received at the same time and often the doctors may need to interpret multiple results in order to provide you with the best plan of care or course of treatment. Therefore, we ask that you please give [place] 2 business days to thoroughly review all your results before contacting the office for clarification. Should we see a critical lab result, you will be contacted sooner.   If You Need Anything After Your Visit  If you have any questions or concerns for your doctor, please call our main line at [phone] and press option 4 to reach your doctor's medical assistant. If no one answers, please leave a voicemail as directed and we will return your call as soon as possible. Messages left after 4 pm will be answered the following business day.   You may also send [place] a message via [place]. We typically respond to MyChart messages within 1-2 business days.  For prescription refills, please ask your pharmacy to contact our office. Our fax number is [phone].  If you have an urgent issue when the clinic is closed that cannot wait until the next business day, you can page your doctor at the number below.    Please note that while we do our best to be available for urgent issues outside of office hours, we are not available 24/7.   If you have an urgent issue and are unable to reach [place], you may choose to seek medical care at your doctor's office, retail clinic, urgent care center, or  emergency room.  If you have a medical emergency, please immediately call 911 or go to the emergency department.  Pager Numbers  - Dr. Nehemiah Massed: [phone]  - Dr. Laurence Ferrari: [phone]  - Dr. Nicole Kindred: [phone]  In the event of inclement weather, please call our main line at [phone] for an update on the status of any delays or closures.  Dermatology Medication Tips: Please keep the boxes that topical medications come in in order to help keep track of the instructions about where and how to use these. Pharmacies typically print the medication instructions only on the boxes and not directly on the medication tubes.   If your medication is too expensive, please contact our office at [phone] option 4 or send [place] a message through [place].   We are unable to tell what your co-pay for medications will be in advance as this is different depending on your insurance coverage. However, we may be able to find a substitute medication at lower cost or fill out paperwork to get insurance to cover a needed medication.   If a prior authorization is required to get your medication covered by your insurance company, please allow [place] 1-2 business days to complete this process.  Drug prices often vary depending on where the prescription is filled and some pharmacies may offer cheaper prices.  The website www.goodrx.com contains coupons for medications through different pharmacies. The prices here do not account  for what the cost may be with help from insurance (it may be cheaper with your insurance), but the website can give you the price if you did not use any insurance.  - You can print the associated coupon and take it with your prescription to the pharmacy.  - You may also stop by our office during regular business hours and pick up a GoodRx coupon card.  - If you need your prescription sent electronically to a different pharmacy, notify our office through [facility] or by phone at  [phone] option 4.     Si Usted Necesita Algo Despus de Su Visita  Tambin puede enviarnos un mensaje a travs de Pharmacist, community. Por lo general respondemos a los mensajes de MyChart en el transcurso de 1 a 2 das hbiles.  Para renovar recetas, por favor pida a su farmacia que se ponga en contacto con nuestra oficina. Harland Dingwall de fax es [place] [phone].  Si tiene un asunto urgente cuando la clnica est cerrada y que no puede esperar hasta el siguiente da hbil, puede llamar/localizar a su doctor(a) al nmero que aparece a continuacin.   Por favor, tenga en cuenta que aunque hacemos todo lo posible para estar disponibles para asuntos urgentes fuera del horario de [place], no estamos disponibles las 24 horas del da, los 7 das de la [place].   Si tiene un problema urgente y no puede comunicarse con nosotros, puede optar por buscar atencin mdica  en el consultorio de su doctor(a), en una clnica privada, en un centro de atencin urgente o en una sala de emergencias.  Si tiene Engineering geologist, por favor llame inmediatamente al 911 o vaya a la sala de emergencias.  Nmeros de bper  - Dr. Nehemiah Massed: [phone]  - Dra. Moye: [phone]  - Dra. Nicole Kindred: [phone]  En caso de inclemencias del [place], por favor llame a Johnsie Kindred principal al [phone] para una actualizacin sobre el [place] de cualquier retraso o cierre.  Consejos para la medicacin en dermatologa: Por favor, guarde las cajas en las que vienen los medicamentos de uso tpico para ayudarle a seguir las instrucciones sobre dnde y cmo usarlos. Las farmacias generalmente imprimen las instrucciones del medicamento slo en las cajas y no directamente en los tubos del [place].   Si su medicamento es muy caro, por favor, pngase en contacto con Zigmund Daniel llamando al [phone] y presione la opcin 4 o envenos un mensaje a travs de Pharmacist, community.   No podemos decirle cul ser su copago por los  medicamentos por adelantado ya que esto es diferente dependiendo de la cobertura de su seguro. Sin embargo, es posible que podamos encontrar un medicamento sustituto a Electrical engineer un formulario para que el seguro cubra el medicamento que se considera necesario.   Si se requiere una autorizacin previa para que su compaa de seguros [place] su medicamento, por favor permtanos de 1 a 2 das hbiles para completar este proceso.  Los precios de los medicamentos varan con frecuencia dependiendo del Environmental consultant de dnde se surte la receta y alguna farmacias pueden ofrecer precios ms baratos.  El sitio web www.goodrx.com tiene cupones para medicamentos de Airline pilot. Los precios aqu no tienen en cuenta lo que podra costar con la ayuda del seguro (puede ser ms barato con su seguro), pero el sitio web puede darle el precio si no utiliz Research scientist (physical sciences).  - Puede imprimir el cupn correspondiente y llevarlo con su receta a la farmacia.  Lavera Guise  puede pasar por nuestra oficina durante el horario de atencin regular y Charity fundraiser una tarjeta de cupones de GoodRx.  - Si necesita que su receta se enve electrnicamente a una farmacia diferente, informe a nuestra oficina a travs de MyChart de [place] o por telfono llamando al [phone] y presione la opcin 4.

## 2022-04-17 NOTE — Progress Notes (Unsigned)
Follow-Up Visit   Subjective  Anthone DELFIN SQUILLACE is a 81 y.o. male who presents for the following: Upper body skin exam (Hx of AKs), Rosacea (Face, Metronidazole 0.75% gel), and Pruritis (Bil sideburns).  The patient presents for Upper Body Skin Exam (UBSE) for skin cancer screening and mole check.  The patient has spots, moles and lesions to be evaluated, some may be new or changing and the patient has concerns that these could be cancer.   The following portions of the chart were reviewed this encounter and updated as appropriate:       Review of Systems:  No other skin or systemic complaints except as noted in HPI or Assessment and Plan.  Objective  Well appearing patient in no apparent distress; mood and affect are within normal limits.  All skin waist up examined.  face Erythema face  R sideburn/temple x 5, L temple x 1, L sideburn x 1 (7) Stuck on waxy paps with erythema  nose x 2 (2) Pink scaly macules    Assessment & Plan   Lentigines - Scattered tan macules - Due to sun exposure - Benign-appearing, observe - Recommend daily broad spectrum sunscreen SPF 30+ to sun-exposed areas, reapply every 2 hours as needed. - Call for any changes  Seborrheic Keratoses - Stuck-on, waxy, tan-brown papules and/or plaques  - Benign-appearing - Discussed benign etiology and prognosis. - Observe - Call for any changes  Melanocytic Nevi - Tan-brown and/or pink-flesh-colored symmetric macules and papules - Benign appearing on exam today - Observation - Call clinic for new or changing moles - Recommend daily use of broad spectrum spf 30+ sunscreen to sun-exposed areas.   Hemangiomas - Red papules - Discussed benign nature - Observe - Call for any changes  Actinic Damage - Chronic condition, secondary to cumulative UV/sun exposure - diffuse scaly erythematous macules with underlying dyspigmentation - Recommend daily broad spectrum sunscreen SPF 30+ to sun-exposed areas,  reapply every 2 hours as needed.  - Staying in the shade or wearing long sleeves, sun glasses (UVA+UVB protection) and wide brim hats (4-inch brim around the entire circumference of the hat) are also recommended for sun protection.  - Call for new or changing lesions.  Skin cancer screening performed today.  Milia - tiny firm white papules - type of cyst - benign - may be extracted if symptomatic - observe  - Start Aklief qhs, sample x 2 [id number] exp 10/2023 Topical retinoid medications like tretinoin/Retin-A, adapalene/Differin, tazarotene/Fabior, and Epiduo/Epiduo Forte can cause dryness and irritation when first started. Only apply a pea-sized amount to the entire affected area. Avoid applying it around the eyes, edges of mouth and creases at the nose. If you experience irritation, use a good moisturizer first and/or apply the medicine less often. If you are doing well with the medicine, you can increase how often you use it until you are applying every night. Be careful with sun protection while using this medication as it can make you sensitive to the sun. This medicine should not be used by pregnant women.    Rosacea face  Rosacea is a chronic progressive skin condition usually affecting the face of adults, causing redness and/or acne bumps. It is treatable but not curable. It sometimes affects the eyes (ocular rosacea) as well. It may respond to topical and/or systemic medication and can flare with stress, sun exposure, alcohol, exercise and some foods.  Daily application of broad spectrum spf 30+ sunscreen to face is recommended to reduce flares.  Cont Metronidazole 0.75% gel qhs  Inflamed seborrheic keratosis (7) R sideburn/temple x 5, L temple x 1, L sideburn x 1  Symptomatic, irritating, patient would like treated.   Destruction of lesion - R sideburn/temple x 5, L temple x 1, L sideburn x 1 Complexity: simple   Destruction method: cryotherapy   Informed consent: discussed  and consent obtained   Timeout:  patient name, date of birth, surgical site, and procedure verified Lesion destroyed using liquid nitrogen: Yes   Region frozen until ice ball extended beyond lesion: Yes   Outcome: patient tolerated procedure well with no complications   Post-procedure details: wound care instructions given    AK (actinic keratosis) (2) nose x 2  Destruction of lesion - nose x 2 Complexity: simple   Destruction method: cryotherapy   Informed consent: discussed and consent obtained   Timeout:  patient name, date of birth, surgical site, and procedure verified Lesion destroyed using liquid nitrogen: Yes   Region frozen until ice ball extended beyond lesion: Yes   Outcome: patient tolerated procedure well with no complications   Post-procedure details: wound care instructions given     Return in about 1 year (around 04/18/2023) for TBSE, Hx of AKs.  I, Othelia Pulling, RMA, am acting as scribe for Sarina Ser, MD .

## 2022-04-18 ENCOUNTER — Encounter: Payer: Self-pay | Admitting: Dermatology

## 2022-07-03 DIAGNOSIS — I10 Essential (primary) hypertension: Secondary | ICD-10-CM | POA: Diagnosis not present

## 2022-07-03 DIAGNOSIS — R7309 Other abnormal glucose: Secondary | ICD-10-CM | POA: Diagnosis not present

## 2022-07-07 DIAGNOSIS — R7303 Prediabetes: Secondary | ICD-10-CM | POA: Diagnosis not present

## 2022-07-07 DIAGNOSIS — Z0000 Encounter for general adult medical examination without abnormal findings: Secondary | ICD-10-CM | POA: Diagnosis not present

## 2022-09-03 ENCOUNTER — Ambulatory Visit (INDEPENDENT_AMBULATORY_CARE_PROVIDER_SITE_OTHER): Payer: No Typology Code available for payment source | Admitting: Dermatology

## 2022-09-03 DIAGNOSIS — L820 Inflamed seborrheic keratosis: Secondary | ICD-10-CM

## 2022-09-03 DIAGNOSIS — I872 Venous insufficiency (chronic) (peripheral): Secondary | ICD-10-CM

## 2022-09-03 DIAGNOSIS — L817 Pigmented purpuric dermatosis: Secondary | ICD-10-CM | POA: Diagnosis not present

## 2022-09-03 DIAGNOSIS — L821 Other seborrheic keratosis: Secondary | ICD-10-CM

## 2022-09-03 DIAGNOSIS — L720 Epidermal cyst: Secondary | ICD-10-CM

## 2022-09-03 DIAGNOSIS — L578 Other skin changes due to chronic exposure to nonionizing radiation: Secondary | ICD-10-CM

## 2022-09-03 NOTE — Progress Notes (Signed)
   Follow-Up Visit   Subjective  Devin Martinez is a 81 y.o. male who presents for the following: [facility] bc they are doing ultrasou (Bil lower lges, legs have had brown discoloration and mild swelling for while). The patient has spots, moles and lesions to be evaluated, some may be new or changing and the patient has concerns that these could be cancer.  The following portions of the chart were reviewed this encounter and updated as appropriate:   Tobacco  Allergies  Meds  Problems  Med Hx  Surg Hx  Fam Hx     Review of Systems:  No other skin or systemic complaints except as noted in HPI or Assessment and Plan.  Objective  Well appearing patient in no apparent distress; mood and affect are within normal limits.  A focused examination was performed including lower legs. Relevant physical exam findings are noted in the Assessment and Plan.  bil lower legs Stasis changes with varicose veins bil lower legs  L temple x 2, R temple x 1 (3) Stuck on waxy paps with erythema   Assessment & Plan  Stasis dermatitis of both legs With Schamberg's Purpura bil lower legs Stasis in the legs causes chronic leg swelling, which may result in itchy or painful rashes, skin discoloration, skin texture changes, and sometimes ulceration.  Recommend daily graduated compression hose/stockings- easiest to put on first thing in morning, remove at bedtime.  Elevate legs as much as possible. Avoid salt/sodium rich foods. Recommend Compression socks  Inflamed seborrheic keratosis (3) L temple x 2, R temple x 1 Symptomatic, irritating, patient would like treated. Destruction of lesion - L temple x 2, R temple x 1 Complexity: simple   Destruction method: cryotherapy   Informed consent: discussed and consent obtained   Timeout:  patient name, date of birth, surgical site, and procedure verified Lesion destroyed using liquid nitrogen: Yes   Region frozen until ice ball extended  beyond lesion: Yes   Outcome: patient tolerated procedure well with no complications   Post-procedure details: wound care instructions given    Milia - tiny firm white papules - type of cyst - benign - may be extracted if symptomatic - observe  - face Start Aklief cr qhs to aa forehead, sample x 1 Lot [id number], exp05/25  Actinic Damage - chronic, secondary to cumulative UV radiation exposure/sun exposure over time - diffuse scaly erythematous macules with underlying dyspigmentation - Recommend daily broad spectrum sunscreen SPF 30+ to sun-exposed areas, reapply every 2 hours as needed.  - Recommend staying in the shade or wearing long sleeves, sun glasses (UVA+UVB protection) and wide brim hats (4-inch brim around the entire circumference of the hat). - Call for new or changing lesions.  Seborrheic Keratoses - Stuck-on, waxy, tan-brown papules and/or plaques  - Benign-appearing - Discussed benign etiology and prognosis. - Observe - Call for any changes  Return for as scheduled , TBSE, Hx of AKs.  I, Othelia Pulling, RMA, am acting as scribe for Sarina Ser, MD . Documentation: I have reviewed the above documentation for accuracy and completeness, and I agree with the above.  Sarina Ser, MD

## 2022-09-03 NOTE — Patient Instructions (Addendum)
Start Akleif cream to white bump on forehead nightly until resolved.   Due to recent changes in healthcare laws, you may see results of your pathology and/or laboratory studies on MyChart before the doctors have had a chance to review them. We understand that in some cases there may be results that are confusing or concerning to you. Please understand that not all results are received at the same time and often the doctors may need to interpret multiple results in order to provide you with the best plan of care or course of treatment. Therefore, we ask that you please give [place] 2 business days to thoroughly review all your results before contacting the office for clarification. Should we see a critical lab result, you will be contacted sooner.   If You Need Anything After Your Visit  If you have any questions or concerns for your doctor, please call our main line at [phone] and press option 4 to reach your doctor's medical assistant. If no one answers, please leave a voicemail as directed and we will return your call as soon as possible. Messages left after 4 pm will be answered the following business day.   You may also send [place] a message via [place]. We typically respond to MyChart messages within 1-2 business days.  For prescription refills, please ask your pharmacy to contact our office. Our fax number is [phone].  If you have an urgent issue when the clinic is closed that cannot wait until the next business day, you can page your doctor at the number below.    Please note that while we do our best to be available for urgent issues outside of office hours, we are not available 24/7.   If you have an urgent issue and are unable to reach [place], you may choose to seek medical care at your doctor's office, retail clinic, urgent care center, or emergency room.  If you have a medical emergency, please immediately call 911 or go to the emergency department.  Pager Numbers  - Dr. Nehemiah Massed:  [phone]  - Dr. Laurence Ferrari: [phone]  - Dr. Nicole Kindred: [phone]  In the event of inclement weather, please call our main line at [phone] for an update on the status of any delays or closures.  Dermatology Medication Tips: Please keep the boxes that topical medications come in in order to help keep track of the instructions about where and how to use these. Pharmacies typically print the medication instructions only on the boxes and not directly on the medication tubes.   If your medication is too expensive, please contact our office at [phone] option 4 or send [place] a message through [place].   We are unable to tell what your co-pay for medications will be in advance as this is different depending on your insurance coverage. However, we may be able to find a substitute medication at lower cost or fill out paperwork to get insurance to cover a needed medication.   If a prior authorization is required to get your medication covered by your insurance company, please allow [place] 1-2 business days to complete this process.  Drug prices often vary depending on where the prescription is filled and some pharmacies may offer cheaper prices.  The website www.goodrx.com contains coupons for medications through different pharmacies. The prices here do not account for what the cost may be with help from insurance (it may be cheaper with your insurance), but the website can give you the price if you did not use any  insurance.  - You can print the associated coupon and take it with your prescription to the pharmacy.  - You may also stop by our office during regular business hours and pick up a GoodRx coupon card.  - If you need your prescription sent electronically to a different pharmacy, notify our office through [facility] or by phone at [phone] option 4.     Si Usted Necesita Algo Despus de Su Visita  Tambin puede enviarnos un mensaje a travs de Pharmacist, community. Por lo general  respondemos a los mensajes de MyChart en el transcurso de 1 a 2 das hbiles.  Para renovar recetas, por favor pida a su farmacia que se ponga en contacto con nuestra oficina. Harland Dingwall de fax es [place] [phone].  Si tiene un asunto urgente cuando la clnica est cerrada y que no puede esperar hasta el siguiente da hbil, puede llamar/localizar a su doctor(a) al nmero que aparece a continuacin.   Por favor, tenga en cuenta que aunque hacemos todo lo posible para estar disponibles para asuntos urgentes fuera del horario de [place], no estamos disponibles las 24 horas del da, los 7 das de la [place].   Si tiene un problema urgente y no puede comunicarse con nosotros, puede optar por buscar atencin mdica  en el consultorio de su doctor(a), en una clnica privada, en un centro de atencin urgente o en una sala de emergencias.  Si tiene Engineering geologist, por favor llame inmediatamente al 911 o vaya a la sala de emergencias.  Nmeros de bper  - Dr. Nehemiah Massed: [phone]  - Dra. Moye: [phone]  - Dra. Nicole Kindred: [phone]  En caso de inclemencias del [place], por favor llame a Johnsie Kindred principal al [phone] para una actualizacin sobre el [place] de cualquier retraso o cierre.  Consejos para la medicacin en dermatologa: Por favor, guarde las cajas en las que vienen los medicamentos de uso tpico para ayudarle a seguir las instrucciones sobre dnde y cmo usarlos. Las farmacias generalmente imprimen las instrucciones del medicamento slo en las cajas y no directamente en los tubos del [place].   Si su medicamento es muy caro, por favor, pngase en contacto con Zigmund Daniel llamando al [phone] y presione la opcin 4 o envenos un mensaje a travs de Pharmacist, community.   No podemos decirle cul ser su copago por los medicamentos por adelantado ya que esto es diferente dependiendo de la cobertura de su seguro. Sin embargo, es posible que podamos encontrar un  medicamento sustituto a Electrical engineer un formulario para que el seguro cubra el medicamento que se considera necesario.   Si se requiere una autorizacin previa para que su compaa de seguros [place] su medicamento, por favor permtanos de 1 a 2 das hbiles para completar este proceso.  Los precios de los medicamentos varan con frecuencia dependiendo del Environmental consultant de dnde se surte la receta y alguna farmacias pueden ofrecer precios ms baratos.  El sitio web www.goodrx.com tiene cupones para medicamentos de Airline pilot. Los precios aqu no tienen en cuenta lo que podra costar con la ayuda del seguro (puede ser ms barato con su seguro), pero el sitio web puede darle el precio si no utiliz Research scientist (physical sciences).  - Puede imprimir el cupn correspondiente y llevarlo con su receta a la farmacia.  - Tambin puede pasar por nuestra oficina durante el horario de atencin regular y Charity fundraiser una tarjeta de cupones de GoodRx.  - Si necesita que su receta se enve electrnicamente a Ardelia Mems  farmacia diferente, informe a nuestra oficina a travs de MyChart de [place] o por telfono llamando al [phone] y presione la opcin 4.

## 2022-09-12 ENCOUNTER — Encounter: Payer: Self-pay | Admitting: Dermatology

## 2022-10-28 ENCOUNTER — Other Ambulatory Visit (INDEPENDENT_AMBULATORY_CARE_PROVIDER_SITE_OTHER): Payer: Self-pay | Admitting: Vascular Surgery

## 2022-10-28 DIAGNOSIS — I83813 Varicose veins of bilateral lower extremities with pain: Secondary | ICD-10-CM

## 2022-11-02 DIAGNOSIS — M79669 Pain in unspecified lower leg: Secondary | ICD-10-CM | POA: Insufficient documentation

## 2022-11-02 NOTE — Progress Notes (Signed)
MRN : [id number]  Devin Martinez is a 82 y.o. ([birth year]) male who presents with chief complaint of legs hurt and swell.  History of Present Illness:   Patient is seen for evaluation of leg pain and leg swelling. The patient first noticed the swelling remotely. The swelling is associated with pain and discoloration. The pain and swelling worsens with prolonged dependency and improves with elevation. The pain is unrelated to activity.  The patient notes that in the morning the legs are significantly improved but they steadily worsened throughout the course of the day. The patient also notes a steady worsening of the discoloration in the ankle and shin area.   The patient denies claudication symptoms.  The patient denies symptoms consistent with rest pain.  The patient denies and extensive history of DJD and LS spine disease.  The patient has no had any past angiography, interventions or vascular surgery.  Elevation makes the leg symptoms better, dependency makes them much worse. There is no history of ulcerations. The patient denies any recent changes in medications.  The patient has not been wearing graduated compression.  The patient denies a history of DVT or PE. There is no prior history of phlebitis. There is no history of primary lymphedema.  No history of malignancies. No history of trauma or groin or pelvic surgery. There is no history of radiation treatment to the groin or pelvis  The patient denies amaurosis fugax or recent TIA symptoms. There are no recent neurological changes noted. The patient denies recent episodes of angina or shortness of breath  Venous duplex bilateral lower extremities is essentially normal  No outpatient medications have been marked as taking for the 11/03/22 encounter (Appointment) with Delana Meyer, Dolores Lory, MD.    Past Medical History:  Diagnosis Date   Actinic keratosis    Asthma    COPD (chronic obstructive pulmonary disease) ([place])     Degenerative arthritis of left knee    Diabetes mellitus    Type II   Herpes    Hyperlipidemia    Hypertension    LVH (left ventricular hypertrophy)    Hx. of   Obesity    Smoker     Past Surgical History:  Procedure Laterality Date   BACK SURGERY     ESOPHAGOGASTRODUODENOSCOPY N/A 12/10/2020   Procedure: ESOPHAGOGASTRODUODENOSCOPY (EGD);  Surgeon: [name], MD;  Location: [facility] ENDOSCOPY;  Service: Gastroenterology;  Laterality: N/A;   ESOPHAGOGASTRODUODENOSCOPY (EGD) WITH PROPOFOL N/A 04/16/2021   Procedure: ESOPHAGOGASTRODUODENOSCOPY (EGD) WITH PROPOFOL;  Surgeon: [name], MD;  Location: [facility] ENDOSCOPY;  Service: Gastroenterology;  Laterality: N/A;   HERNIA REPAIR     JOINT REPLACEMENT     LAMINECTOMY      Social History Social History   Tobacco Use   Smoking status: Former    Types: Cigarettes   Smokeless tobacco: Never  Vaping Use   Vaping Use: Never used  Substance Use Topics   Alcohol use: Yes    Comment: occasionally   Drug use: No    Family History Family History  Problem Relation Age of Onset   Heart attack Mother     Allergies  Allergen Reactions   [place] Nut (Berthollefia [place]) Skin Test Itching   Enalapril Swelling     REVIEW OF SYSTEMS (Negative unless checked)  Constitutional: '[]'$ Weight loss  '[]'$ Fever  '[]'$ Chills Cardiac: '[]'$ Chest pain   '[]'$ Chest pressure   '[]'$ Palpitations   '[]'$ Shortness of breath when laying flat   '[]'$   Shortness of breath with exertion. Vascular:  '[]'$ Pain in legs with walking   '[x]'$ Pain in legs at rest  '[]'$ History of DVT   '[]'$ Phlebitis   '[x]'$ Swelling in legs   '[]'$ Varicose veins   '[]'$ Non-healing ulcers Pulmonary:   '[]'$ Uses home oxygen   '[]'$ Productive cough   '[]'$ Hemoptysis   '[]'$ Wheeze  '[]'$ COPD   '[]'$ Asthma Neurologic:  '[]'$ Dizziness   '[]'$ Seizures   '[]'$ History of stroke   '[]'$ History of TIA  '[]'$ Aphasia   '[]'$ Vissual changes   '[]'$ Weakness or numbness in arm   '[]'$ Weakness or numbness in leg Musculoskeletal:   '[]'$ Joint swelling   '[]'$ Joint pain   '[]'$ Low back  pain Hematologic:  '[]'$ Easy bruising  '[]'$ Easy bleeding   '[]'$ Hypercoagulable state   '[]'$ Anemic Gastrointestinal:  '[]'$ Diarrhea   '[]'$ Vomiting  '[]'$ Gastroesophageal reflux/heartburn   '[]'$ Difficulty swallowing. Genitourinary:  '[]'$ Chronic kidney disease   '[]'$ Difficult urination  '[]'$ Frequent urination   '[]'$ Blood in urine Skin:  '[]'$ Rashes   '[]'$ Ulcers  Psychological:  '[]'$ History of anxiety   '[]'$  History of major depression.  Physical Examination  There were no vitals filed for this visit. There is no height or weight on file to calculate BMI. Gen: WD/WN, NAD Head: [place]/AT, No temporalis wasting.  Ear/Nose/Throat: Hearing grossly intact, nares w/o erythema or drainage, pinna without lesions Eyes: PER, EOMI, sclera nonicteric.  Neck: Supple, no gross masses.  No JVD.  Pulmonary:  Good air movement, no audible wheezing, no use of accessory muscles.  Cardiac: RRR, precordium not hyperdynamic. Vascular:  scattered varicosities present bilaterally.  Moderate venous stasis changes to the legs bilaterally.  2+ soft pitting edema  Vessel Right Left  Radial Palpable Palpable  Gastrointestinal: soft, non-distended. No guarding/no peritoneal signs.  Musculoskeletal: M/S 5/5 throughout.  No deformity.  Neurologic: CN 2-12 intact. Pain and light touch intact in extremities.  Symmetrical.  Speech is fluent. Motor exam as listed above. Psychiatric: Judgment intact, Mood & affect appropriate for pt's clinical situation. Dermatologic: Venous rashes no ulcers noted.  No changes consistent with cellulitis. Lymph : No lichenification or skin changes of chronic lymphedema.  CBC Lab Results  Component Value Date   WBC 9.7 05/24/2018   HGB 12.7 (L) 05/24/2018   HCT 37.1 (L) 05/24/2018   MCV 84.9 05/24/2018   PLT 278 05/24/2018    BMET    Component Value Date/Time   NA 136 05/24/2018 0412   K 4.6 05/24/2018 0412   CL 99 05/24/2018 0412   CO2 27 05/24/2018 0412   GLUCOSE 179 (H) 05/24/2018 0412   BUN 23 05/24/2018 0412    CREATININE 0.87 05/24/2018 0412   CALCIUM 8.8 (L) 05/24/2018 0412   GFRNONAA >60 05/24/2018 0412   GFRAA >60 05/24/2018 0412   CrCl cannot be calculated (Patient's most recent lab result is older than the maximum 21 days allowed.).  COAG No results found for: "INR", "PROTIME"  Radiology No results found.   Assessment/Plan 1. Pain and swelling of lower leg, unspecified laterality Recommend:  No surgery or intervention at this point in time.  I have reviewed my discussion with the patient regarding venous insufficiency and why it causes symptoms. I have discussed with the patient the chronic skin changes that accompany venous insufficiency and the long term sequela such as ulceration. Patient will contnue wearing graduated compression stockings on a daily basis, as this has provided excellent control of his edema. The patient will put the stockings on first thing in the morning and removing them in the evening. The patient is reminded not to sleep in  the stockings.  In addition, behavioral modification including elevation during the day will be initiated. Exercise is strongly encouraged.  Previous duplex ultrasound of the lower extremities shows normal deep system, no significant superficial reflux was identified.  Given the patient's good control and lack of any problems regarding the venous insufficiency and lymphedema a lymph pump in not need at this time.    The patient will follow up with me PRN should anything change.  The patient voices agreement with this plan.  2. Mixed hyperlipidemia Continue statin as ordered and reviewed, no changes at this time  3. Type 2 diabetes mellitus with other specified complication, unspecified whether long term insulin use (HCC) Continue hypoglycemic medications as already ordered, these medications have been reviewed and there are no changes at this time.  Hgb A1C to be monitored as already arranged by primary service  4. Primary  hypertension Continue antihypertensive medications as already ordered, these medications have been reviewed and there are no changes at this time.  5. Varicose veins of bilateral lower extremities with pain See #1 - VAS [place] LOWER EXTREMITY VENOUS REFLUX    Hortencia Pilar, MD  11/02/2022 11:47 AM

## 2022-11-03 ENCOUNTER — Ambulatory Visit (INDEPENDENT_AMBULATORY_CARE_PROVIDER_SITE_OTHER): Payer: PPO

## 2022-11-03 ENCOUNTER — Ambulatory Visit (INDEPENDENT_AMBULATORY_CARE_PROVIDER_SITE_OTHER): Payer: PPO | Admitting: Vascular Surgery

## 2022-11-03 ENCOUNTER — Encounter (INDEPENDENT_AMBULATORY_CARE_PROVIDER_SITE_OTHER): Payer: Self-pay | Admitting: Vascular Surgery

## 2022-11-03 VITALS — BP 164/67 | HR 69 | Resp 16 | Ht 70.0 in | Wt 216.0 lb

## 2022-11-03 DIAGNOSIS — M79669 Pain in unspecified lower leg: Secondary | ICD-10-CM | POA: Diagnosis not present

## 2022-11-03 DIAGNOSIS — E1169 Type 2 diabetes mellitus with other specified complication: Secondary | ICD-10-CM | POA: Diagnosis not present

## 2022-11-03 DIAGNOSIS — I83813 Varicose veins of bilateral lower extremities with pain: Secondary | ICD-10-CM | POA: Diagnosis not present

## 2022-11-03 DIAGNOSIS — E782 Mixed hyperlipidemia: Secondary | ICD-10-CM

## 2022-11-03 DIAGNOSIS — I10 Essential (primary) hypertension: Secondary | ICD-10-CM | POA: Diagnosis not present

## 2022-11-03 DIAGNOSIS — M7989 Other specified soft tissue disorders: Secondary | ICD-10-CM

## 2022-11-10 ENCOUNTER — Encounter (INDEPENDENT_AMBULATORY_CARE_PROVIDER_SITE_OTHER): Payer: PPO | Admitting: Vascular Surgery

## 2022-11-10 ENCOUNTER — Encounter (INDEPENDENT_AMBULATORY_CARE_PROVIDER_SITE_OTHER): Payer: PPO

## 2023-03-03 DIAGNOSIS — R6889 Other general symptoms and signs: Secondary | ICD-10-CM | POA: Diagnosis not present

## 2023-03-03 DIAGNOSIS — I10 Essential (primary) hypertension: Secondary | ICD-10-CM | POA: Diagnosis not present

## 2023-03-10 DIAGNOSIS — I10 Essential (primary) hypertension: Secondary | ICD-10-CM | POA: Diagnosis not present

## 2023-03-10 DIAGNOSIS — K229 Disease of esophagus, unspecified: Secondary | ICD-10-CM | POA: Diagnosis not present

## 2023-03-10 DIAGNOSIS — E782 Mixed hyperlipidemia: Secondary | ICD-10-CM | POA: Diagnosis not present

## 2023-03-10 DIAGNOSIS — R7303 Prediabetes: Secondary | ICD-10-CM | POA: Diagnosis not present

## 2023-03-10 DIAGNOSIS — J449 Chronic obstructive pulmonary disease, unspecified: Secondary | ICD-10-CM | POA: Diagnosis not present

## 2023-03-18 ENCOUNTER — Ambulatory Visit: Payer: PPO | Admitting: Podiatry

## 2023-04-22 ENCOUNTER — Ambulatory Visit (INDEPENDENT_AMBULATORY_CARE_PROVIDER_SITE_OTHER): Payer: PPO | Admitting: Dermatology

## 2023-04-22 VITALS — BP 139/72

## 2023-04-22 DIAGNOSIS — Z79899 Other long term (current) drug therapy: Secondary | ICD-10-CM

## 2023-04-22 DIAGNOSIS — W908XXA Exposure to other nonionizing radiation, initial encounter: Secondary | ICD-10-CM | POA: Diagnosis not present

## 2023-04-22 DIAGNOSIS — L821 Other seborrheic keratosis: Secondary | ICD-10-CM | POA: Diagnosis not present

## 2023-04-22 DIAGNOSIS — L814 Other melanin hyperpigmentation: Secondary | ICD-10-CM | POA: Diagnosis not present

## 2023-04-22 DIAGNOSIS — L719 Rosacea, unspecified: Secondary | ICD-10-CM

## 2023-04-22 DIAGNOSIS — Z872 Personal history of diseases of the skin and subcutaneous tissue: Secondary | ICD-10-CM

## 2023-04-22 DIAGNOSIS — L578 Other skin changes due to chronic exposure to nonionizing radiation: Secondary | ICD-10-CM | POA: Diagnosis not present

## 2023-04-22 DIAGNOSIS — L820 Inflamed seborrheic keratosis: Secondary | ICD-10-CM

## 2023-04-22 NOTE — Patient Instructions (Addendum)
Cryotherapy Aftercare  Wash gently with soap and water everyday.   Apply Vaseline and Band-Aid daily until healed.     Due to recent changes in healthcare laws, you may see results of your pathology and/or laboratory studies on MyChart before the doctors have had a chance to review them. We understand that in some cases there may be results that are confusing or concerning to you. Please understand that not all results are received at the same time and often the doctors may need to interpret multiple results in order to provide you with the best plan of care or course of treatment. Therefore, we ask that you please give us 2 business days to thoroughly review all your results before contacting the office for clarification. Should we see a critical lab result, you will be contacted sooner.   If You Need Anything After Your Visit  If you have any questions or concerns for your doctor, please call our main line at [phone] and press option 4 to reach your doctor's medical assistant. If no one answers, please leave a voicemail as directed and we will return your call as soon as possible. Messages left after 4 pm will be answered the following business day.   You may also send us a message via MyChart. We typically respond to MyChart messages within 1-2 business days.  For prescription refills, please ask your pharmacy to contact our office. Our fax number is [phone].  If you have an urgent issue when the clinic is closed that cannot wait until the next business day, you can page your doctor at the number below.    Please note that while we do our best to be available for urgent issues outside of office hours, we are not available 24/7.   If you have an urgent issue and are unable to reach us, you may choose to seek medical care at your doctor's office, retail clinic, urgent care center, or emergency room.  If you have a medical emergency, please immediately call 911 or go to the  emergency department.  Pager Numbers  - Dr. Kowalski: [phone]  - Dr. Moye: [phone]  - Dr. Stewart: [phone]  In the event of inclement weather, please call our main line at [phone] for an update on the status of any delays or closures.  Dermatology Medication Tips: Please keep the boxes that topical medications come in in order to help keep track of the instructions about where and how to use these. Pharmacies typically print the medication instructions only on the boxes and not directly on the medication tubes.   If your medication is too expensive, please contact our office at [phone] option 4 or send us a message through MyChart.   We are unable to tell what your co-pay for medications will be in advance as this is different depending on your insurance coverage. However, we may be able to find a substitute medication at lower cost or fill out paperwork to get insurance to cover a needed medication.   If a prior authorization is required to get your medication covered by your insurance company, please allow us 1-2 business days to complete this process.  Drug prices often vary depending on where the prescription is filled and some pharmacies may offer cheaper prices.  The website www.goodrx.com contains coupons for medications through different pharmacies. The prices here do not account for what the cost may be with help from insurance (it may be cheaper with your insurance), but the website can   give you the price if you did not use any insurance.  - You can print the associated coupon and take it with your prescription to the pharmacy.  - You may also stop by our office during regular business hours and pick up a GoodRx coupon card.  - If you need your prescription sent electronically to a different pharmacy, notify our office through [place] MyChart or by phone at [phone] option 4.     Si Usted Necesita Algo Despus de Su Visita  Tambin puede  enviarnos un mensaje a travs de MyChart. Por lo general respondemos a los mensajes de MyChart en el transcurso de 1 a 2 das hbiles.  Para renovar recetas, por favor pida a su farmacia que se ponga en contacto con nuestra oficina. Nuestro nmero de fax es el [phone].  Si tiene un asunto urgente cuando la clnica est cerrada y que no puede esperar hasta el siguiente da hbil, puede llamar/localizar a su doctor(a) al nmero que aparece a continuacin.   Por favor, tenga en cuenta que aunque hacemos todo lo posible para estar disponibles para asuntos urgentes fuera del horario de oficina, no estamos disponibles las 24 horas del da, los 7 das de la semana.   Si tiene un problema urgente y no puede comunicarse con nosotros, puede optar por buscar atencin mdica  en el consultorio de su doctor(a), en una clnica privada, en un centro de atencin urgente o en una sala de emergencias.  Si tiene una emergencia mdica, por favor llame inmediatamente al 911 o vaya a la sala de emergencias.  Nmeros de bper  - Dr. Kowalski: [phone]  - Dra. Moye: [phone]  - Dra. Stewart: [phone]  En caso de inclemencias del tiempo, por favor llame a nuestra lnea principal al [phone] para una actualizacin sobre el estado de cualquier retraso o cierre.  Consejos para la medicacin en dermatologa: Por favor, guarde las cajas en las que vienen los medicamentos de uso tpico para ayudarle a seguir las instrucciones sobre dnde y cmo usarlos. Las farmacias generalmente imprimen las instrucciones del medicamento slo en las cajas y no directamente en los tubos del medicamento.   Si su medicamento es muy caro, por favor, pngase en contacto con nuestra oficina llamando al [phone] y presione la opcin 4 o envenos un mensaje a travs de MyChart.   No podemos decirle cul ser su copago por los medicamentos por adelantado ya que esto es diferente dependiendo de la cobertura de su seguro.  Sin embargo, es posible que podamos encontrar un medicamento sustituto a menor costo o llenar un formulario para que el seguro cubra el medicamento que se considera necesario.   Si se requiere una autorizacin previa para que su compaa de seguros cubra su medicamento, por favor permtanos de 1 a 2 das hbiles para completar este proceso.  Los precios de los medicamentos varan con frecuencia dependiendo del lugar de dnde se surte la receta y alguna farmacias pueden ofrecer precios ms baratos.  El sitio web www.goodrx.com tiene cupones para medicamentos de diferentes farmacias. Los precios aqu no tienen en cuenta lo que podra costar con la ayuda del seguro (puede ser ms barato con su seguro), pero el sitio web puede darle el precio si no utiliz ningn seguro.  - Puede imprimir el cupn correspondiente y llevarlo con su receta a la farmacia.  - Tambin puede pasar por nuestra oficina durante el horario de atencin regular y recoger una tarjeta de cupones de GoodRx.  -   Si necesita que su receta se enve electrnicamente a una farmacia diferente, informe a nuestra oficina a travs de MyChart de [place] o por telfono llamando al [phone] y presione la opcin 4.  

## 2023-04-22 NOTE — Progress Notes (Signed)
Follow-Up Visit   Subjective  Devin Martinez is a 82 y.o. male who presents for the following: Hx of Aks, f/u, check sun exposed areas, pt declines TBSE, Rosacea face, Metronidazole gel  0.75% prn flares The patient has spots, moles and lesions to be evaluated, some may be new or changing and the patient may have concern these could be cancer.  The following portions of the chart were reviewed this encounter and updated as appropriate: medications, allergies, medical history  Review of Systems:  No other skin or systemic complaints except as noted in HPI or Assessment and Plan.  Objective  Well appearing patient in no apparent distress; mood and affect are within normal limits.  A focused examination was performed of the following areas: Face, arms Relevant exam findings are noted in the Assessment and Plan.  L temple x 1 Stuck on waxy paps with erythema   Assessment & Plan   ROSACEA Exam Mid face erythema with telangiectasias  Chronic condition with duration or expected duration over one year. Currently well-controlled.  Rosacea is a chronic progressive skin condition usually affecting the face of adults, causing redness and/or acne bumps. It is treatable but not curable. It sometimes affects the eyes (ocular rosacea) as well. It may respond to topical and/or systemic medication and can flare with stress, sun exposure, alcohol, exercise, topical steroids (including hydrocortisone/cortisone 10) and some foods.  Daily application of broad spectrum spf 30+ sunscreen to face is recommended to reduce flares.  Patient denies grittiness of the eyes  Treatment Plan Cont Metronidazole 0.75% gel prn flares to face  ACTINIC DAMAGE - chronic, secondary to cumulative UV radiation exposure/sun exposure over time - diffuse scaly erythematous macules with underlying dyspigmentation - Recommend daily broad spectrum sunscreen SPF 30+ to sun-exposed areas, reapply every 2 hours as needed.  -  Recommend staying in the shade or wearing long sleeves, sun glasses (UVA+UVB protection) and wide brim hats (4-inch brim around the entire circumference of the hat). - Call for new or changing lesions.  Inflamed seborrheic keratosis L temple x 1  Symptomatic, irritating, patient would like treated.   Destruction of lesion - L temple x 1 Complexity: simple   Destruction method: cryotherapy   Informed consent: discussed and consent obtained   Timeout:  patient name, date of birth, surgical site, and procedure verified Lesion destroyed using liquid nitrogen: Yes   Region frozen until ice ball extended beyond lesion: Yes   Outcome: patient tolerated procedure well with no complications   Post-procedure details: wound care instructions given    HISTORY OF PRECANCEROUS ACTINIC KERATOSIS - site(s) of PreCancerous Actinic Keratosis clear today. - these may recur and new lesions may form requiring treatment to prevent transformation into skin cancer - observe for new or changing spots and contact [facility] for appointment if occur - photoprotection with sun protective clothing; sunglasses and broad spectrum sunscreen with SPF of at least 30 + and frequent self skin exams recommended - yearly exams by a dermatologist recommended for persons with history of PreCancerous Actinic Keratoses   SEBORRHEIC KERATOSIS - Stuck-on, waxy, tan-brown papules and/or plaques  - Benign-appearing - Discussed benign etiology and prognosis. - Observe - Call for any changes  LENTIGINES Exam: scattered tan macules Due to sun exposure Treatment Plan: Benign-appearing, observe. Recommend daily broad spectrum sunscreen SPF 30+ to sun-exposed areas, reapply every 2 hours as needed.  Call for any changes  Return in about 1 year (around 04/21/2024) for Hx of AKs.  I, Ardis Rowan, RMA, am acting as scribe for Armida Sans, MD .  Documentation: I have reviewed the above documentation for accuracy and  completeness, and I agree with the above.  Armida Sans, MD

## 2023-04-24 ENCOUNTER — Encounter: Payer: Self-pay | Admitting: Dermatology

## 2023-09-15 DIAGNOSIS — J309 Allergic rhinitis, unspecified: Secondary | ICD-10-CM | POA: Diagnosis not present

## 2023-09-15 DIAGNOSIS — I10 Essential (primary) hypertension: Secondary | ICD-10-CM | POA: Diagnosis not present

## 2023-09-15 DIAGNOSIS — R7303 Prediabetes: Secondary | ICD-10-CM | POA: Diagnosis not present

## 2023-09-15 DIAGNOSIS — K229 Disease of esophagus, unspecified: Secondary | ICD-10-CM | POA: Diagnosis not present

## 2023-09-15 DIAGNOSIS — J449 Chronic obstructive pulmonary disease, unspecified: Secondary | ICD-10-CM | POA: Diagnosis not present

## 2023-09-15 DIAGNOSIS — E782 Mixed hyperlipidemia: Secondary | ICD-10-CM | POA: Diagnosis not present

## 2024-03-08 DIAGNOSIS — E782 Mixed hyperlipidemia: Secondary | ICD-10-CM | POA: Diagnosis not present

## 2024-03-08 DIAGNOSIS — I10 Essential (primary) hypertension: Secondary | ICD-10-CM | POA: Diagnosis not present

## 2024-03-08 DIAGNOSIS — R7303 Prediabetes: Secondary | ICD-10-CM | POA: Diagnosis not present

## 2024-03-15 DIAGNOSIS — J309 Allergic rhinitis, unspecified: Secondary | ICD-10-CM | POA: Diagnosis not present

## 2024-03-15 DIAGNOSIS — I10 Essential (primary) hypertension: Secondary | ICD-10-CM | POA: Diagnosis not present

## 2024-03-15 DIAGNOSIS — J449 Chronic obstructive pulmonary disease, unspecified: Secondary | ICD-10-CM | POA: Diagnosis not present

## 2024-03-15 DIAGNOSIS — R7303 Prediabetes: Secondary | ICD-10-CM | POA: Diagnosis not present

## 2024-03-15 DIAGNOSIS — E782 Mixed hyperlipidemia: Secondary | ICD-10-CM | POA: Diagnosis not present

## 2024-04-21 ENCOUNTER — Ambulatory Visit: Payer: No Typology Code available for payment source | Admitting: Dermatology

## 2024-04-21 ENCOUNTER — Encounter: Payer: Self-pay | Admitting: Dermatology

## 2024-04-21 DIAGNOSIS — L570 Actinic keratosis: Secondary | ICD-10-CM | POA: Diagnosis not present

## 2024-04-21 DIAGNOSIS — D692 Other nonthrombocytopenic purpura: Secondary | ICD-10-CM

## 2024-04-21 DIAGNOSIS — L709 Acne, unspecified: Secondary | ICD-10-CM | POA: Diagnosis not present

## 2024-04-21 DIAGNOSIS — W908XXA Exposure to other nonionizing radiation, initial encounter: Secondary | ICD-10-CM

## 2024-04-21 DIAGNOSIS — L719 Rosacea, unspecified: Secondary | ICD-10-CM

## 2024-04-21 DIAGNOSIS — L80 Vitiligo: Secondary | ICD-10-CM | POA: Diagnosis not present

## 2024-04-21 DIAGNOSIS — Z7189 Other specified counseling: Secondary | ICD-10-CM

## 2024-04-21 DIAGNOSIS — L578 Other skin changes due to chronic exposure to nonionizing radiation: Secondary | ICD-10-CM | POA: Diagnosis not present

## 2024-04-21 DIAGNOSIS — Z79899 Other long term (current) drug therapy: Secondary | ICD-10-CM

## 2024-04-21 DIAGNOSIS — L720 Epidermal cyst: Secondary | ICD-10-CM

## 2024-04-21 DIAGNOSIS — L729 Follicular cyst of the skin and subcutaneous tissue, unspecified: Secondary | ICD-10-CM

## 2024-04-21 NOTE — Progress Notes (Signed)
 Follow-Up Visit   Subjective  Devin Martinez is a 83 y.o. male who presents for the following: AK f/u 1 yr, Rosacea face, Metronidazole  0.75% gel prn, Milia face, pt would like samples of Aklief, rough spots L ear The patient has spots, moles and lesions to be evaluated, some may be new or changing and the patient may have concern these could be cancer.  The following portions of the chart were reviewed this encounter and updated as appropriate: medications, allergies, medical history  Review of Systems:  No other skin or systemic complaints except as noted in HPI or Assessment and Plan.  Objective  Well appearing patient in no apparent distress; mood and affect are within normal limits.  A focused examination was performed of the following areas: Face, ears, back, arms, hands  Relevant exam findings are noted in the Assessment and Plan.  Left Ear x 2 (2) Pink scaly macules       Assessment & Plan   Milia Face Cont Aklief qhs, samples x 8, Lot [id number] exp 11/2025 - tiny firm white papules - type of cyst - benign - sometimes these will clear with nightly OTC adapalene/Differin 0.1% gel or retinol. - may be extracted if symptomatic - observe  ROSACEA with Acne face Exam Mid face erythema with telangiectasias, comedones nose Chronic and persistent condition with duration or expected duration over one year. Condition is symptomatic / bothersome to patient. Not to goal. Rosacea is a chronic progressive skin condition usually affecting the face of adults, causing redness and/or acne bumps. It is treatable but not curable. It sometimes affects the eyes (ocular rosacea) as well. It may respond to topical and/or systemic medication and can flare with stress, sun exposure, alcohol, exercise, topical steroids (including hydrocortisone/cortisone 10) and some foods.  Daily application of broad spectrum spf 30+ sunscreen to face is recommended to reduce flares. Treatment Plan Cont  Metronidazole  0.75% gel prn flares Start Aklief cr qhs to nose  Purpura - Chronic; persistent and recurrent.  Treatable, but not curable. arms - Violaceous macules and patches - Benign - Related to trauma, age, sun damage and/or use of blood thinners, chronic use of topical and/or oral steroids - Observe - Can use OTC arnica containing moisturizer such as Dermend Bruise Formula if desired - Call for worsening or other concerns  VITILIGO  Hands, wrist, back Exam: depigmented patches on hands, wrist, back, see photos Vitiligo is a chronic autoimmune condition which causes loss of skin pigment and is commonly seen on the face and may also involve areas of trauma like hands, elbows, knees, and ankles. There is no cure and it is difficult to treat.  Treatments include topical steroids and other topical anti-inflammatory ointments/creams and topical and oral Jak inhibitors.  Sometimes narrow band UV light therapy or Xtrac laser is helpful, both of which require twice weekly treatments for at least 3-6 months.  Antioxidant vitamins, such as Vitamins A,C,E,D, Folic Acid and B12 may be added to enhance treatment. Heliocare may also enhance treatment results. Treatment Plan: Discussed Opzelura cream, pt declines Recommend Thyroid lab check = TSH.  Will order/ AK (ACTINIC KERATOSIS) (2) Left Ear x 2 (2) Actinic keratoses are precancerous spots that appear secondary to cumulative UV radiation exposure/sun exposure over time. They are chronic with expected duration over 1 year. A portion of actinic keratoses will progress to squamous cell carcinoma of the skin. It is not possible to reliably predict which spots will progress to skin cancer and so  treatment is recommended to prevent development of skin cancer.  Recommend daily broad spectrum sunscreen SPF 30+ to sun-exposed areas, reapply every 2 hours as needed.  Recommend staying in the shade or wearing long sleeves, sun glasses (UVA+UVB protection) and  wide brim hats (4-inch brim around the entire circumference of the hat). Call for new or changing lesions. Destruction of lesion - Left Ear x 2 (2) Complexity: simple   Destruction method: cryotherapy   Informed consent: discussed and consent obtained   Timeout:  patient name, date of birth, surgical site, and procedure verified Lesion destroyed using liquid nitrogen: Yes   Region frozen until ice ball extended beyond lesion: Yes   Outcome: patient tolerated procedure well with no complications   Post-procedure details: wound care instructions given   ROSACEA   COUNSELING AND COORDINATION OF CARE   MEDICATION MANAGEMENT   ACTINIC SKIN DAMAGE   PURPURA (HCC)   CYST OF SKIN   VITILIGO    ACTINIC DAMAGE - chronic, secondary to cumulative UV radiation exposure/sun exposure over time - diffuse scaly erythematous macules with underlying dyspigmentation - Recommend daily broad spectrum sunscreen SPF 30+ to sun-exposed areas, reapply every 2 hours as needed.  - Recommend staying in the shade or wearing long sleeves, sun glasses (UVA+UVB protection) and wide brim hats (4-inch brim around the entire circumference of the hat). - Call for new or changing lesions.   No follow-ups on file.  I, Grayce Saunas, RMA, am acting as scribe for Alm Rhyme, MD .   Documentation: I have reviewed the above documentation for accuracy and completeness, and I agree with the above.  Alm Rhyme, MD

## 2024-04-21 NOTE — Patient Instructions (Addendum)
 Cryotherapy Aftercare  Wash gently with soap and water everyday.   Apply Vaseline and Band-Aid daily until healed.   Continue Aklief nightly to white bumps on forehead and to blackheads on nose   Due to recent changes in healthcare laws, you may see results of your pathology and/or laboratory studies on MyChart before the doctors have had a chance to review them. We understand that in some cases there may be results that are confusing or concerning to you. Please understand that not all results are received at the same time and often the doctors may need to interpret multiple results in order to provide you with the best plan of care or course of treatment. Therefore, we ask that you please give us  2 business days to thoroughly review all your results before contacting the office for clarification. Should we see a critical lab result, you will be contacted sooner.   If You Need Anything After Your Visit  If you have any questions or concerns for your doctor, please call our main line at [phone] and press option 4 to reach your doctor's medical assistant. If no one answers, please leave a voicemail as directed and we will return your call as soon as possible. Messages left after 4 pm will be answered the following business day.   You may also send us  a message via MyChart. We typically respond to MyChart messages within 1-2 business days.  For prescription refills, please ask your pharmacy to contact our office. Our fax number is [phone].  If you have an urgent issue when the clinic is closed that cannot wait until the next business day, you can page your doctor at the number below.    Please note that while we do our best to be available for urgent issues outside of office hours, we are not available 24/7.   If you have an urgent issue and are unable to reach us , you may choose to seek medical care at your doctor's office, retail clinic, urgent care center, or emergency room.  If  you have a medical emergency, please immediately call 911 or go to the emergency department.  Pager Numbers  - Dr. Hester: [phone]  - Dr. Jackquline: [phone]  - Dr. Claudene: [phone]   In the event of inclement weather, please call our main line at [phone] for an update on the status of any delays or closures.  Dermatology Medication Tips: Please keep the boxes that topical medications come in in order to help keep track of the instructions about where and how to use these. Pharmacies typically print the medication instructions only on the boxes and not directly on the medication tubes.   If your medication is too expensive, please contact our office at [phone] option 4 or send us  a message through MyChart.   We are unable to tell what your co-pay for medications will be in advance as this is different depending on your insurance coverage. However, we may be able to find a substitute medication at lower cost or fill out paperwork to get insurance to cover a needed medication.   If a prior authorization is required to get your medication covered by your insurance company, please allow us  1-2 business days to complete this process.  Drug prices often vary depending on where the prescription is filled and some pharmacies may offer cheaper prices.  The website www.goodrx.com contains coupons for medications through different pharmacies. The prices here do not account for what the cost may be with  help from insurance (it may be cheaper with your insurance), but the website can give you the price if you did not use any insurance.  - You can print the associated coupon and take it with your prescription to the pharmacy.  - You may also stop by our office during regular business hours and pick up a GoodRx coupon card.  - If you need your prescription sent electronically to a different pharmacy, notify our office through [facility] or by phone at [phone] option  4.     Si Usted Necesita Algo Despus de Su Visita  Tambin puede enviarnos un mensaje a travs de Clinical cytogeneticist. Por lo general respondemos a los mensajes de MyChart en el transcurso de 1 a 2 das hbiles.  Para renovar recetas, por favor pida a su farmacia que se ponga en contacto con nuestra oficina. Randi lakes de fax es [place] [phone].  Si tiene un asunto urgente cuando la clnica est cerrada y que no puede esperar hasta el siguiente da hbil, puede llamar/localizar a su doctor(a) al nmero que aparece a continuacin.   Por favor, tenga en cuenta que aunque hacemos todo lo posible para estar disponibles para asuntos urgentes fuera del horario de [place], no estamos disponibles las 24 horas del da, los 7 [address]  [address] de la [place].   Si tiene un problema urgente y no puede comunicarse con nosotros, puede optar por buscar atencin mdica  en el consultorio de su doctor(a), en una clnica privada, en un centro de atencin urgente o en una sala de emergencias.  Si tiene Engineer, drilling, por favor llame inmediatamente al 911 o vaya a la sala de emergencias.  Nmeros de bper  - Dr. Hester: [phone]  - Dra. Jackquline: [phone]  - Dr. Claudene: [phone]   En caso de inclemencias del tiempo, por favor llame a landry capes principal al [phone] para una actualizacin sobre el [place] de cualquier retraso o cierre.  Consejos para la medicacin en dermatologa: Por favor, guarde las cajas en las que vienen los medicamentos de uso tpico para ayudarle a seguir las instrucciones sobre dnde y cmo usarlos. Las farmacias generalmente imprimen las instrucciones del medicamento slo en las cajas y no directamente en los tubos del [place].   Si su medicamento es muy caro, por favor, pngase en contacto con landry rieger llamando al [phone] y presione la opcin 4 o envenos un mensaje a travs de Clinical cytogeneticist.   No podemos decirle cul ser su copago por los medicamentos por  adelantado ya que esto es diferente dependiendo de la cobertura de su seguro. Sin embargo, es posible que podamos encontrar un medicamento sustituto a Audiological scientist un formulario para que el seguro cubra el medicamento que se considera necesario.   Si se requiere una autorizacin previa para que su compaa de seguros malta su medicamento, por favor permtanos de 1 a 2 das hbiles para completar este proceso.  Los precios de los medicamentos varan con frecuencia dependiendo del Environmental consultant de dnde se surte la receta y alguna farmacias pueden ofrecer precios ms baratos.  El sitio web www.goodrx.com tiene cupones para medicamentos de Health and safety inspector. Los precios aqu no tienen en cuenta lo que podra costar con la ayuda del seguro (puede ser ms barato con su seguro), pero el sitio web puede darle el precio si no utiliz Tourist information centre manager.  - Puede imprimir el cupn correspondiente y llevarlo con su receta a la farmacia.  - Tambin puede pasar por landry oficina durante  el horario de atencin regular y Education officer, museum una tarjeta de cupones de GoodRx.  - Si necesita que su receta se enve electrnicamente a una farmacia diferente, informe a nuestra oficina a travs de MyChart de [place] o por telfono llamando al [phone] y presione la opcin 4.

## 2024-04-25 ENCOUNTER — Telehealth: Payer: Self-pay

## 2024-04-25 ENCOUNTER — Other Ambulatory Visit: Payer: Self-pay

## 2024-04-25 DIAGNOSIS — L80 Vitiligo: Secondary | ICD-10-CM

## 2024-04-25 NOTE — Telephone Encounter (Signed)
 Spoke to patient and advised Dr. Hester would like to check his TSH due to recent Vitiligo diagnosis.  Advised pt he could come by office to pick up lab requisition./sh

## 2024-04-26 ENCOUNTER — Ambulatory Visit: Payer: Self-pay | Admitting: Dermatology

## 2024-04-26 LAB — TSH: TSH: 4.83 u[IU]/mL — ABNORMAL HIGH (ref 0.450–4.500)

## 2024-04-26 NOTE — Telephone Encounter (Addendum)
 Tried calling patient regarding results. No answer. LM for patient to return call.   ----- Message from Alm Rhyme sent at 04/26/2024  9:54 AM EDT ----- TSH = Thyroid test is Mildly elevated This means thyroid is not making enough thyroid hormone.  Thyroid problems can be associated with Vitiligo.  Pt has Vitiligo.  Pt needs evaluation by PCP and possibly Endocrinologist. Please advise pt and send all this info to pts Primary Care Provider / Physician ----- Message ----- From: Rebecka Memos Lab Results In Sent: 04/26/2024   7:36 AM EDT To: Alm JAYSON Rhyme, MD

## 2024-04-27 NOTE — Telephone Encounter (Signed)
 Patient advised of information per Dr. Karrie and information faxed to Dr. Kriste office. aw

## 2024-05-06 DIAGNOSIS — R001 Bradycardia, unspecified: Secondary | ICD-10-CM | POA: Diagnosis not present

## 2024-05-17 ENCOUNTER — Ambulatory Visit (INDEPENDENT_AMBULATORY_CARE_PROVIDER_SITE_OTHER): Admitting: Dermatology

## 2024-05-17 DIAGNOSIS — Q828 Other specified congenital malformations of skin: Secondary | ICD-10-CM

## 2024-05-17 DIAGNOSIS — L720 Epidermal cyst: Secondary | ICD-10-CM | POA: Diagnosis not present

## 2024-05-17 DIAGNOSIS — W908XXA Exposure to other nonionizing radiation, initial encounter: Secondary | ICD-10-CM

## 2024-05-17 DIAGNOSIS — L821 Other seborrheic keratosis: Secondary | ICD-10-CM

## 2024-05-17 DIAGNOSIS — Z7189 Other specified counseling: Secondary | ICD-10-CM | POA: Diagnosis not present

## 2024-05-17 DIAGNOSIS — L578 Other skin changes due to chronic exposure to nonionizing radiation: Secondary | ICD-10-CM

## 2024-05-17 DIAGNOSIS — L729 Follicular cyst of the skin and subcutaneous tissue, unspecified: Secondary | ICD-10-CM

## 2024-05-17 NOTE — Patient Instructions (Addendum)

## 2024-05-17 NOTE — Progress Notes (Addendum)
   Follow-Up Visit   Subjective  Devin Martinez is a 83 y.o. male who presents for the following:Patient concerned about a spot he noticed several months ago on right lower leg. Also reports some white bumps at top of left foot and on forehead.  Has been using some samples of Aklief he was given to bumps.    The following portions of the chart were reviewed this encounter and updated as appropriate: medications, allergies, medical history  Review of Systems:  No other skin or systemic complaints except as noted in HPI or Assessment and Plan.  Objective  Well appearing patient in no apparent distress; mood and affect are within normal limits.   A focused examination was performed of the following areas: Right lower leg, face, b/l feet and ankles  Relevant exam findings are noted in the Assessment and Plan.  right lower leg x 1 2 cm red patch  See photo    Assessment & Plan   MILIA Exam: tiny erythematous firm white papule at forehead Discussed this is a type of cyst. Benign-appearing.  Can continue using samples of Aklief topically using a pea size amount to aa nightly as tolerated.  Benign-appearing.  Observation.  Call clinic for new or changing lesions.  Recommend daily use of broad spectrum spf 30+ sunscreen to sun-exposed areas.   SEBORRHEIC KERATOSIS At left foot and B/l ankles  - discussed safe to use sample of Aklief on rough spots - Stuck-on, waxy, tan-brown papules and/or plaques  - Benign-appearing - Discussed benign etiology and prognosis. - Observe - Call for any changes  ACTINIC DAMAGE - chronic, secondary to cumulative UV radiation exposure/sun exposure over time - diffuse scaly erythematous macules with underlying dyspigmentation - Recommend daily broad spectrum sunscreen SPF 30+ to sun-exposed areas, reapply every 2 hours as needed.  - Recommend staying in the shade or wearing long sleeves, sun glasses (UVA+UVB protection) and wide brim hats (4-inch brim  around the entire circumference of the hat). - Call for new or changing lesions.   POROKERATOSIS right lower leg x 1  Discussed sometimes difficult to treat and may need shave removal if not improved with LN2  Treated with Ln2 today  Destruction of lesion - right lower leg x 1 Complexity: simple   Destruction method: cryotherapy   Informed consent: discussed and consent obtained   Timeout:  patient name, date of birth, surgical site, and procedure verified Lesion destroyed using liquid nitrogen: Yes   Region frozen until ice ball extended beyond lesion: Yes   Outcome: patient tolerated procedure well with no complications   Post-procedure details: wound care instructions given    SEBORRHEIC KERATOSIS   ACTINIC SKIN DAMAGE   CYST OF SKIN    Return for keep follow up as scheduled july .  IEleanor Blush, CMA, am acting as scribe for Alm Rhyme, MD.   Documentation: I have reviewed the above documentation for accuracy and completeness, and I agree with the above.  Alm Rhyme, MD

## 2024-05-18 ENCOUNTER — Encounter: Payer: Self-pay | Admitting: Dermatology

## 2024-05-23 ENCOUNTER — Telehealth: Payer: Self-pay

## 2024-05-23 NOTE — Telephone Encounter (Signed)
 Patient called regarding LN2 frozen last week. He states the area is very painful with redness spreading out. Please advise.

## 2024-05-24 MED ORDER — DOXYCYCLINE HYCLATE 100 MG PO TABS: 100.0000 mg | ORAL_TABLET | Freq: Two times a day (BID) | ORAL | 0 refills | Status: AC

## 2024-05-24 MED ORDER — MUPIROCIN 2 % EX OINT: 1.0000 | TOPICAL_OINTMENT | Freq: Two times a day (BID) | CUTANEOUS | 0 refills | Status: AC

## 2024-05-24 NOTE — Telephone Encounter (Signed)
Patient advised and RX sent in. aw 

## 2024-05-24 NOTE — Addendum Note (Signed)
 Addended by: TERESA PALMA R on: 05/24/2024 09:36 AM   Modules accepted: Orders

## 2024-08-10 ENCOUNTER — Ambulatory Visit (INDEPENDENT_AMBULATORY_CARE_PROVIDER_SITE_OTHER)

## 2024-08-10 DIAGNOSIS — D1801 Hemangioma of skin and subcutaneous tissue: Secondary | ICD-10-CM | POA: Diagnosis not present

## 2024-08-10 DIAGNOSIS — L814 Other melanin hyperpigmentation: Secondary | ICD-10-CM

## 2024-08-10 DIAGNOSIS — L570 Actinic keratosis: Secondary | ICD-10-CM

## 2024-08-10 DIAGNOSIS — W908XXA Exposure to other nonionizing radiation, initial encounter: Secondary | ICD-10-CM

## 2024-08-10 DIAGNOSIS — L821 Other seborrheic keratosis: Secondary | ICD-10-CM

## 2024-08-10 DIAGNOSIS — Z872 Personal history of diseases of the skin and subcutaneous tissue: Secondary | ICD-10-CM

## 2024-08-10 DIAGNOSIS — L578 Other skin changes due to chronic exposure to nonionizing radiation: Secondary | ICD-10-CM

## 2024-08-10 DIAGNOSIS — L719 Rosacea, unspecified: Secondary | ICD-10-CM | POA: Diagnosis not present

## 2024-08-10 DIAGNOSIS — Z1283 Encounter for screening for malignant neoplasm of skin: Secondary | ICD-10-CM | POA: Diagnosis not present

## 2024-08-10 MED ORDER — METRONIDAZOLE 0.75 % EX GEL: 1.0000 | Freq: Two times a day (BID) | CUTANEOUS | 2 refills | Status: AC

## 2024-08-10 NOTE — Patient Instructions (Signed)

## 2024-08-10 NOTE — Progress Notes (Signed)
 Subjective   Devin Martinez is a 83 y.o. male who presents for the following: Lesion(s) of concern . Patient is established patient   Today patient reports: Area of concern on the left ear.   Would like more rosacea cream   Review of Systems:    No other skin or systemic complaints except as noted in HPI or Assessment and Plan.  The following portions of the chart were reviewed this encounter and updated as appropriate: medications, allergies, medical history  Relevant Medical History:  Personal history of actinic keratosis   Objective  Well appearing patient in no apparent distress; mood and affect are within normal limits. Examination was performed of the: Sun Exposed Exam: Scalp, head, eyes, ears, nose, lips, neck, upper extremities, hands, fingers, fingernails  Examination notable for: Lentigo/lentigines: Scattered pigmented macules that are tan to brown in color and are somewhat non-uniform in shape and concentrated in the sun-exposed areas, Seborrheic Keratosis(es): Stuck-on appearing keratotic papule(s) on the trunk, some  irritated with redness, crusting, edema, and/or partial avulsion, Actinic Damage/Elastosis: chronic sun damage: dyspigmentation, telangiectasia, and wrinkling, Actinic keratosis: Scaly erythematous macule(s) concentrated on sun exposed areas   Examination limited by: Clothing and Patient deferred removal     Left Ear, scalp, right ear (5) Pink scaly macules  Assessment & Plan   SKIN CANCER SCREENING PERFORMED TODAY.  BENIGN SKIN FINDINGS  - Lentigines  - Seborrheic keratoses  - Nevus/Multiple Benign Nevi - Reassurance provided regarding the benign appearance of lesions noted on exam today; no treatment is indicated in the absence of symptoms/changes. - Reinforced importance of photoprotective strategies including liberal and frequent sunscreen use of a broad-spectrum SPF 30 or greater, use of protective clothing, and sun avoidance for prevention of  cutaneous malignancy and photoaging.  Counseled patient on the importance of regular self-skin monitoring as well as routine clinical skin examinations as scheduled.   ACTINIC DAMAGE - Chronic condition, secondary to cumulative UV/sun exposure - Recommend daily broad spectrum sunscreen SPF 30+ to sun-exposed areas, reapply every 2 hours as needed.  - Staying in the shade or wearing long sleeves, sun glasses (UVA+UVB protection) and wide brim hats (4-inch brim around the entire circumference of the hat) are also recommended for sun protection.  - Call for new or changing lesions.  Personal history of actinic keratosis  - Reviewed medical history for full details  - Reviewed sun protective measures as above - Encouraged full body skin exams    Rosacea Chronic stable condition  - Reviewed etiology and probable recurrent nature of this condition - Discussed potential triggers including caffeine, heat, sun exposure, chocolate, etc. and recommended patient attempt to identify and avoid triggers - Continue metronidazole  0.75% gel BID to affected areas of face  Level of service outlined above   Procedures, orders, diagnosis for this visit:  ACTINIC KERATOSIS (5) Left Ear, scalp, right ear (5) Actinic keratoses are precancerous spots that appear secondary to cumulative UV radiation exposure/sun exposure over time. They are chronic with expected duration over 1 year. A portion of actinic keratoses will progress to squamous cell carcinoma of the skin. It is not possible to reliably predict which spots will progress to skin cancer and so treatment is recommended to prevent development of skin cancer.  Recommend daily broad spectrum sunscreen SPF 30+ to sun-exposed areas, reapply every 2 hours as needed.  Recommend staying in the shade or wearing long sleeves, sun glasses (UVA+UVB protection) and wide brim hats (4-inch brim around the entire  circumference of the hat). Call for new or changing  lesions. Destruction of lesion - Left Ear, scalp, right ear (5) Complexity: simple   Destruction method: cryotherapy   Informed consent: discussed and consent obtained   Timeout:  patient name, date of birth, surgical site, and procedure verified Lesion destroyed using liquid nitrogen: Yes   Region frozen until ice ball extended beyond lesion: Yes   Cryo cycles: 1 or 2. Outcome: patient tolerated procedure well with no complications   Post-procedure details: wound care instructions given    SEBORRHEIC KERATOSIS   LENTIGO   ACTINIC SKIN DAMAGE   ROSACEA    Actinic keratosis -     Destruction of lesion  Seborrheic keratosis  Lentigo  Actinic skin damage  Rosacea  Other orders -     metroNIDAZOLE ; Apply 1 Application topically 2 (two) times daily.  Dispense: 45 g; Refill: 2    Return to clinic: Return for Appointment as scheduled.  I, Emerick Ege, CMA am acting as scribe for Lauraine JAYSON Kanaris, MD.   Documentation: I have reviewed the above documentation for accuracy and completeness, and I agree with the above.  Lauraine JAYSON Kanaris, MD

## 2024-09-13 DIAGNOSIS — E782 Mixed hyperlipidemia: Secondary | ICD-10-CM | POA: Diagnosis not present

## 2024-09-13 DIAGNOSIS — R7303 Prediabetes: Secondary | ICD-10-CM | POA: Diagnosis not present

## 2024-09-13 DIAGNOSIS — I10 Essential (primary) hypertension: Secondary | ICD-10-CM | POA: Diagnosis not present

## 2025-04-27 ENCOUNTER — Ambulatory Visit: Admitting: Dermatology
